# Patient Record
Sex: Male | Born: 1950 | Race: Black or African American | Hispanic: No | Smoking: Current some day smoker
Health system: Southern US, Community
[De-identification: ages and names within clinical notes are randomized; demographics above are authoritative.]

## PROBLEM LIST (undated history)

## (undated) DIAGNOSIS — I5189 Other ill-defined heart diseases: Secondary | ICD-10-CM

## (undated) DIAGNOSIS — E7800 Pure hypercholesterolemia, unspecified: Secondary | ICD-10-CM

## (undated) DIAGNOSIS — K219 Gastro-esophageal reflux disease without esophagitis: Secondary | ICD-10-CM

## (undated) DIAGNOSIS — K5900 Constipation, unspecified: Secondary | ICD-10-CM

## (undated) DIAGNOSIS — J45909 Unspecified asthma, uncomplicated: Secondary | ICD-10-CM

## (undated) HISTORY — DX: Other ill-defined heart diseases: I51.89

## (undated) HISTORY — DX: Gastro-esophageal reflux disease without esophagitis: K21.9

## (undated) HISTORY — PX: FRACTURE SURGERY: SHX138

## (undated) HISTORY — PX: ROTATOR CUFF REPAIR: SHX139

## (undated) HISTORY — DX: Constipation, unspecified: K59.00

---

## 1998-04-23 ENCOUNTER — Ambulatory Visit (HOSPITAL_COMMUNITY): Admission: RE | Admit: 1998-04-23 | Discharge: 1998-04-23 | Payer: Self-pay

## 1999-01-08 ENCOUNTER — Ambulatory Visit (HOSPITAL_COMMUNITY): Admission: RE | Admit: 1999-01-08 | Discharge: 1999-01-08 | Payer: Self-pay | Admitting: Unknown Physician Specialty

## 1999-02-18 ENCOUNTER — Emergency Department (HOSPITAL_COMMUNITY): Admission: EM | Admit: 1999-02-18 | Discharge: 1999-02-18 | Payer: Self-pay | Admitting: Emergency Medicine

## 1999-02-18 ENCOUNTER — Encounter: Payer: Self-pay | Admitting: Emergency Medicine

## 1999-02-23 ENCOUNTER — Encounter: Admission: RE | Admit: 1999-02-23 | Discharge: 1999-02-23 | Payer: Self-pay | Admitting: *Deleted

## 1999-06-19 ENCOUNTER — Emergency Department (HOSPITAL_COMMUNITY): Admission: EM | Admit: 1999-06-19 | Discharge: 1999-06-19 | Payer: Self-pay | Admitting: Emergency Medicine

## 1999-06-19 ENCOUNTER — Encounter: Payer: Self-pay | Admitting: Emergency Medicine

## 2000-04-10 ENCOUNTER — Emergency Department (HOSPITAL_COMMUNITY): Admission: EM | Admit: 2000-04-10 | Discharge: 2000-04-10 | Payer: Self-pay | Admitting: Internal Medicine

## 2000-05-19 ENCOUNTER — Ambulatory Visit (HOSPITAL_BASED_OUTPATIENT_CLINIC_OR_DEPARTMENT_OTHER): Admission: RE | Admit: 2000-05-19 | Discharge: 2000-05-20 | Payer: Self-pay | Admitting: Orthopedic Surgery

## 2000-07-12 ENCOUNTER — Encounter: Admission: RE | Admit: 2000-07-12 | Discharge: 2000-10-10 | Payer: Self-pay | Admitting: Endocrinology

## 2000-08-19 ENCOUNTER — Encounter: Payer: Self-pay | Admitting: Emergency Medicine

## 2000-08-19 ENCOUNTER — Emergency Department (HOSPITAL_COMMUNITY): Admission: EM | Admit: 2000-08-19 | Discharge: 2000-08-19 | Payer: Self-pay | Admitting: Emergency Medicine

## 2002-01-19 ENCOUNTER — Encounter: Payer: Self-pay | Admitting: Emergency Medicine

## 2002-01-19 ENCOUNTER — Emergency Department (HOSPITAL_COMMUNITY): Admission: EM | Admit: 2002-01-19 | Discharge: 2002-01-19 | Payer: Self-pay | Admitting: Emergency Medicine

## 2002-01-22 ENCOUNTER — Emergency Department (HOSPITAL_COMMUNITY): Admission: EM | Admit: 2002-01-22 | Discharge: 2002-01-22 | Payer: Self-pay | Admitting: Emergency Medicine

## 2003-03-19 ENCOUNTER — Emergency Department (HOSPITAL_COMMUNITY): Admission: EM | Admit: 2003-03-19 | Discharge: 2003-03-20 | Payer: Self-pay | Admitting: Emergency Medicine

## 2004-07-20 ENCOUNTER — Emergency Department (HOSPITAL_COMMUNITY): Admission: EM | Admit: 2004-07-20 | Discharge: 2004-07-20 | Payer: Self-pay | Admitting: Emergency Medicine

## 2011-02-10 ENCOUNTER — Emergency Department (HOSPITAL_COMMUNITY)
Admission: EM | Admit: 2011-02-10 | Discharge: 2011-02-10 | Disposition: A | Discharge status: Home / Self Care | Payer: Medicare Other | Attending: Emergency Medicine | Admitting: Emergency Medicine

## 2011-02-10 DIAGNOSIS — I10 Essential (primary) hypertension: Secondary | ICD-10-CM | POA: Insufficient documentation

## 2011-02-10 DIAGNOSIS — E119 Type 2 diabetes mellitus without complications: Secondary | ICD-10-CM | POA: Insufficient documentation

## 2011-02-10 DIAGNOSIS — K5289 Other specified noninfective gastroenteritis and colitis: Secondary | ICD-10-CM | POA: Insufficient documentation

## 2011-02-10 DIAGNOSIS — B029 Zoster without complications: Secondary | ICD-10-CM | POA: Insufficient documentation

## 2011-02-10 LAB — POCT I-STAT, CHEM 8
BUN: 7 mg/dL (ref 6–23)
Calcium, Ion: 1.16 mmol/L (ref 1.12–1.32)
Chloride: 104 mEq/L (ref 96–112)
Creatinine, Ser: 0.9 mg/dL (ref 0.4–1.5)
Glucose, Bld: 135 mg/dL — ABNORMAL HIGH (ref 70–99)
HCT: 41 % (ref 39.0–52.0)
Hemoglobin: 13.9 g/dL (ref 13.0–17.0)
Potassium: 4 mEq/L (ref 3.5–5.1)
Sodium: 141 mEq/L (ref 135–145)
TCO2: 26 mmol/L (ref 0–100)

## 2011-02-25 ENCOUNTER — Emergency Department (HOSPITAL_COMMUNITY): Payer: Medicare Other

## 2011-02-25 ENCOUNTER — Emergency Department (HOSPITAL_COMMUNITY)
Admission: EM | Admit: 2011-02-25 | Discharge: 2011-02-25 | Disposition: A | Discharge status: Home / Self Care | Payer: Medicare Other | Attending: Emergency Medicine | Admitting: Emergency Medicine

## 2011-02-25 DIAGNOSIS — I10 Essential (primary) hypertension: Secondary | ICD-10-CM | POA: Insufficient documentation

## 2011-02-25 DIAGNOSIS — K2970 Gastritis, unspecified, without bleeding: Secondary | ICD-10-CM | POA: Insufficient documentation

## 2011-02-25 DIAGNOSIS — E119 Type 2 diabetes mellitus without complications: Secondary | ICD-10-CM | POA: Insufficient documentation

## 2011-02-25 DIAGNOSIS — Z8619 Personal history of other infectious and parasitic diseases: Secondary | ICD-10-CM | POA: Insufficient documentation

## 2011-02-25 DIAGNOSIS — N281 Cyst of kidney, acquired: Secondary | ICD-10-CM | POA: Insufficient documentation

## 2011-02-25 DIAGNOSIS — R109 Unspecified abdominal pain: Secondary | ICD-10-CM | POA: Insufficient documentation

## 2011-02-25 LAB — GLUCOSE, CAPILLARY: Glucose-Capillary: 109 mg/dL — ABNORMAL HIGH (ref 70–99)

## 2011-02-25 LAB — COMPREHENSIVE METABOLIC PANEL
ALT: 25 U/L (ref 0–53)
AST: 26 U/L (ref 0–37)
Albumin: 3.7 g/dL (ref 3.5–5.2)
Alkaline Phosphatase: 34 U/L — ABNORMAL LOW (ref 39–117)
BUN: 14 mg/dL (ref 6–23)
CO2: 27 mEq/L (ref 19–32)
Calcium: 9.3 mg/dL (ref 8.4–10.5)
Chloride: 101 mEq/L (ref 96–112)
Creatinine, Ser: 0.8 mg/dL (ref 0.4–1.5)
GFR calc Af Amer: >60 mL/min (ref >60)
GFR calc non Af Amer: >60 mL/min (ref >60)
Glucose, Bld: 106 mg/dL — ABNORMAL HIGH (ref 70–99)
Potassium: 4.3 mEq/L (ref 3.5–5.1)
Sodium: 135 mEq/L (ref 135–145)
Total Bilirubin: 0.4 mg/dL (ref 0.3–1.2)
Total Protein: 8 g/dL (ref 6.0–8.3)

## 2011-02-25 LAB — DIFFERENTIAL
Basophils Absolute: 0 10*3/uL (ref 0.0–0.1)
Basophils Relative: 0 % (ref 0–1)
Eosinophils Absolute: 0.1 10*3/uL (ref 0.0–0.7)
Eosinophils Relative: 1 % (ref 0–5)
Lymphocytes Relative: 34 % (ref 12–46)
Lymphs Abs: 2.7 10*3/uL (ref 0.7–4.0)
Monocytes Absolute: 0.7 10*3/uL (ref 0.1–1.0)
Monocytes Relative: 9 % (ref 3–12)
Neutro Abs: 4.4 10*3/uL (ref 1.7–7.7)
Neutrophils Relative %: 56 % (ref 43–77)

## 2011-02-25 LAB — URINALYSIS, ROUTINE W REFLEX MICROSCOPIC
Bilirubin Urine: NEGATIVE
Glucose, UA: NEGATIVE mg/dL
Hgb urine dipstick: NEGATIVE
Ketones, ur: NEGATIVE mg/dL
Leukocytes, UA: NEGATIVE
Nitrite: NEGATIVE
Protein, ur: 100 mg/dL — AB
Specific Gravity, Urine: 1.013 (ref 1.005–1.030)
Urobilinogen, UA: 1 mg/dL (ref 0.0–1.0)
pH: 6 (ref 5.0–8.0)

## 2011-02-25 LAB — CBC
HCT: 37.5 % — ABNORMAL LOW (ref 39.0–52.0)
Hemoglobin: 12.9 g/dL — ABNORMAL LOW (ref 13.0–17.0)
MCH: 31.2 pg (ref 26.0–34.0)
MCHC: 34.4 g/dL (ref 30.0–36.0)
MCV: 90.8 fL (ref 78.0–100.0)
Platelets: 143 10*3/uL — ABNORMAL LOW (ref 150–400)
RBC: 4.13 MIL/uL — ABNORMAL LOW (ref 4.22–5.81)
RDW: 13 % (ref 11.5–15.5)
WBC: 7.9 10*3/uL (ref 4.0–10.5)

## 2011-02-25 LAB — CK TOTAL AND CKMB (NOT AT ARMC)
CK, MB: 2.5 ng/mL (ref 0.3–4.0)
Relative Index: 1.9 (ref 0.0–2.5)
Total CK: 132 U/L (ref 7–232)

## 2011-02-25 LAB — LIPASE, BLOOD: Lipase: 41 U/L (ref 11–59)

## 2011-02-25 LAB — URINE MICROSCOPIC-ADD ON

## 2011-02-25 LAB — TROPONIN I: Troponin I: <0.3 ng/mL (ref <0.30)

## 2012-05-13 ENCOUNTER — Encounter (HOSPITAL_COMMUNITY): Payer: Self-pay | Admitting: *Deleted

## 2012-05-13 ENCOUNTER — Emergency Department (HOSPITAL_COMMUNITY)
Admission: EM | Admit: 2012-05-13 | Discharge: 2012-05-13 | Disposition: A | Discharge status: Home / Self Care | Payer: Medicare Other | Attending: Emergency Medicine | Admitting: Emergency Medicine

## 2012-05-13 ENCOUNTER — Emergency Department (HOSPITAL_COMMUNITY): Payer: Medicare Other

## 2012-05-13 DIAGNOSIS — J45909 Unspecified asthma, uncomplicated: Secondary | ICD-10-CM | POA: Insufficient documentation

## 2012-05-13 DIAGNOSIS — E119 Type 2 diabetes mellitus without complications: Secondary | ICD-10-CM | POA: Insufficient documentation

## 2012-05-13 DIAGNOSIS — K2100 Gastro-esophageal reflux disease with esophagitis, without bleeding: Secondary | ICD-10-CM | POA: Insufficient documentation

## 2012-05-13 DIAGNOSIS — R002 Palpitations: Secondary | ICD-10-CM | POA: Insufficient documentation

## 2012-05-13 DIAGNOSIS — Z79899 Other long term (current) drug therapy: Secondary | ICD-10-CM | POA: Insufficient documentation

## 2012-05-13 DIAGNOSIS — E7800 Pure hypercholesterolemia, unspecified: Secondary | ICD-10-CM | POA: Insufficient documentation

## 2012-05-13 HISTORY — DX: Pure hypercholesterolemia, unspecified: E78.00

## 2012-05-13 HISTORY — DX: Unspecified asthma, uncomplicated: J45.909

## 2012-05-13 LAB — BASIC METABOLIC PANEL
BUN: 13 mg/dL (ref 6–23)
Calcium: 9.5 mg/dL (ref 8.4–10.5)
Creatinine, Ser: 0.89 mg/dL (ref 0.50–1.35)
GFR calc Af Amer: >90 mL/min (ref >90)
GFR calc non Af Amer: >90 mL/min (ref >90)
Glucose, Bld: 116 mg/dL — ABNORMAL HIGH (ref 70–99)
Potassium: 3.8 mEq/L (ref 3.5–5.1)

## 2012-05-13 LAB — POCT I-STAT TROPONIN I: Troponin i, poc: 0 ng/mL (ref 0.00–0.08)

## 2012-05-13 LAB — CBC
HCT: 36.7 % — ABNORMAL LOW (ref 39.0–52.0)
Hemoglobin: 12.6 g/dL — ABNORMAL LOW (ref 13.0–17.0)
MCH: 31 pg (ref 26.0–34.0)
MCHC: 34.3 g/dL (ref 30.0–36.0)
MCV: 90.2 fL (ref 78.0–100.0)
Platelets: 144 10*3/uL — ABNORMAL LOW (ref 150–400)
RBC: 4.07 MIL/uL — ABNORMAL LOW (ref 4.22–5.81)
RDW: 12.8 % (ref 11.5–15.5)
WBC: 8.2 10*3/uL (ref 4.0–10.5)

## 2012-05-13 MED ORDER — PANTOPRAZOLE SODIUM 40 MG PO TBEC
80.0000 mg | DELAYED_RELEASE_TABLET | Freq: Once | ORAL | Status: AC
  Administered 2012-05-13: 80 mg via ORAL
  Filled 2012-05-13: qty 2

## 2012-05-13 MED ORDER — GI COCKTAIL ~~LOC~~
30.0000 mL | Freq: Once | ORAL | Status: AC
  Administered 2012-05-13: 30 mL via ORAL
  Filled 2012-05-13: qty 30

## 2012-05-13 MED ORDER — PANTOPRAZOLE SODIUM 40 MG PO TBEC: 40.0000 mg | DELAYED_RELEASE_TABLET | Freq: Once | ORAL | Status: DC

## 2012-05-13 NOTE — ED Notes (Signed)
Reports waking up this am with palpitations, sob when it occurs. No acute distress noted at triage, ekg done, NSR on monitor.

## 2012-05-13 NOTE — ED Notes (Signed)
Patient states he has no pain at this time.
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
## 2012-05-13 NOTE — ED Provider Notes (Signed)
History     CSN: [id number]  Arrival date & time 05/13/12  1610   First MD Initiated Contact with Patient 05/13/12 1655      Chief Complaint  Patient presents with  . Irregular Heart Beat     HPI Patient again having palpitations at around 3 PM today.  They were brief.  Not associated with nausea or vomiting or diaphoresis.  Patient has no known cardiac history.  Does have symptoms of daily esophageal reflux and belching. Past Medical History  Diagnosis Date  . Asthma   . Diabetes mellitus   . High cholesterol     History reviewed. No pertinent past surgical history.  History reviewed. No pertinent family history.  History  Substance Use Topics  . Smoking status: Not on file  . Smokeless tobacco: Not on file  . Alcohol Use: No      Review of Systems  All other systems reviewed and are negative.    Allergies  Review of patient's allergies indicates no known allergies.  Home Medications   Current Outpatient Rx  Name Route Sig Dispense Refill  . ACYCLOVIR 800 MG PO TABS Oral Take 400 mg by mouth daily.    . ALBUTEROL SULFATE HFA 108 (90 BASE) MCG/ACT IN AERS Inhalation Inhale 2 puffs into the lungs every 4 (four) hours as needed. For shortness of breath    . ASPIRIN EC 81 MG PO TBEC Oral Take 81 mg by mouth daily.    . ATORVASTATIN CALCIUM 40 MG PO TABS Oral Take 20 mg by mouth at bedtime.    Marland Kitchen VITAMIN D-3 1000 UNITS PO CAPS Oral Take 1 capsule by mouth daily.    . OMEGA-3 FATTY ACIDS 1000 MG PO CAPS Oral Take 2 g by mouth 3 (three) times daily.    Marland Kitchen GLIPIZIDE 10 MG PO TABS Oral Take 10 mg by mouth 2 (two) times daily before a meal.    . HYDROCODONE-ACETAMINOPHEN 10-500 MG PO TABS Oral Take 1 tablet by mouth every 4 (four) hours as needed. For severe neck and low back pain    . LISINOPRIL 10 MG PO TABS Oral Take 5 mg by mouth daily.    Marland Kitchen METFORMIN HCL 1000 MG PO TABS Oral Take 1,000 mg by mouth 2 (two) times daily with a meal.    . MOMETASONE FUROATE 220 MCG/INH  IN AEPB Inhalation Inhale 2 puffs into the lungs at bedtime and may repeat dose one time if needed.    Carma Leaven M PLUS PO TABS Oral Take 1 tablet by mouth daily.    Marland Kitchen TERAZOSIN HCL 5 MG PO CAPS Oral Take 5 mg by mouth at bedtime.    Marland Kitchen UREA 40 % EX CREA Topical Apply 1 application topically. To feet    . ZOLPIDEM TARTRATE 10 MG PO TABS Oral Take 10 mg by mouth at bedtime as needed. For sleep    . PANTOPRAZOLE SODIUM 40 MG PO TBEC Oral Take 1 tablet (40 mg total) by mouth once. 30 tablet 0    BP 132/86  Pulse 87  Temp 98.3 F (36.8 C) (Oral)  Resp 18  SpO2 98%  Physical Exam  Nursing note and vitals reviewed. Constitutional: He is oriented to person, place, and time. He appears well-developed. No distress.  HENT:  Head: Normocephalic and atraumatic.  Eyes: Pupils are equal, round, and reactive to light.  Neck: Normal range of motion.  Cardiovascular: Normal rate and intact distal pulses.  Date: 05/13/2012  Rate: 89  Rhythm: normal sinus rhythm  QRS Axis: normal  Intervals: normal  ST/T Wave abnormalities: normal  Conduction Disutrbances: none  Narrative Interpretation: unremarkable      Pulmonary/Chest: No respiratory distress.  Abdominal: Normal appearance. He exhibits no distension.  Musculoskeletal: Normal range of motion.  Neurological: He is alert and oriented to person, place, and time. No cranial nerve deficit.  Skin: Skin is warm and dry. No rash noted.  Psychiatric: He has a normal mood and affect. His behavior is normal.    ED Course  Procedures (including critical care time) Scheduled Meds:   . gi cocktail  30 mL Oral Once  . pantoprazole  80 mg Oral Once   Continuous Infusions:  PRN Meds:.  Labs Reviewed  CBC - Abnormal; Notable for the following:    RBC 4.07 (*)     Hemoglobin 12.6 (*)     HCT 36.7 (*)     Platelets 144 (*)  PLATELET COUNT CONFIRMED BY SMEAR   All other components within normal limits  BASIC METABOLIC PANEL - Abnormal;  Notable for the following:    Glucose, Bld 116 (*)     All other components within normal limits  POCT I-STAT TROPONIN I  POCT I-STAT TROPONIN I   Dg Chest 2 View  05/13/2012  *RADIOLOGY REPORT*  Clinical Data: Palpitations.  CHEST - 2 VIEW  Comparison: None.  Findings: Heart and mediastinal contours are within normal limits. No focal opacities or effusions.  No acute bony abnormality.  IMPRESSION: No active cardiopulmonary disease.  Original Report Authenticated By: Cyndie Chime, M.D.     1. Reflux esophagitis       MDM  GI cocktail made patient feel much better.  In light of normal EKG and 2 sets of negative troponins I feel this most likely represents esophageal reflux symptoms.   After treatment in the ED the patient feels back to baseline and wants to go home.         Nelia Shi, MD 05/13/12 [phone]

## 2013-06-18 ENCOUNTER — Emergency Department (HOSPITAL_COMMUNITY)
Admission: EM | Admit: 2013-06-18 | Discharge: 2013-06-18 | Disposition: A | Discharge status: Home / Self Care | Payer: Medicare Other | Attending: Emergency Medicine | Admitting: Emergency Medicine

## 2013-06-18 ENCOUNTER — Encounter (HOSPITAL_COMMUNITY): Payer: Self-pay | Admitting: Emergency Medicine

## 2013-06-18 ENCOUNTER — Emergency Department (HOSPITAL_COMMUNITY): Payer: Medicare Other

## 2013-06-18 DIAGNOSIS — J45909 Unspecified asthma, uncomplicated: Secondary | ICD-10-CM | POA: Insufficient documentation

## 2013-06-18 DIAGNOSIS — Y939 Activity, unspecified: Secondary | ICD-10-CM | POA: Insufficient documentation

## 2013-06-18 DIAGNOSIS — Y92410 Unspecified street and highway as the place of occurrence of the external cause: Secondary | ICD-10-CM | POA: Insufficient documentation

## 2013-06-18 DIAGNOSIS — S4980XA Other specified injuries of shoulder and upper arm, unspecified arm, initial encounter: Secondary | ICD-10-CM | POA: Insufficient documentation

## 2013-06-18 DIAGNOSIS — M25511 Pain in right shoulder: Secondary | ICD-10-CM

## 2013-06-18 DIAGNOSIS — Z7982 Long term (current) use of aspirin: Secondary | ICD-10-CM | POA: Insufficient documentation

## 2013-06-18 DIAGNOSIS — R209 Unspecified disturbances of skin sensation: Secondary | ICD-10-CM | POA: Insufficient documentation

## 2013-06-18 DIAGNOSIS — Z79899 Other long term (current) drug therapy: Secondary | ICD-10-CM | POA: Insufficient documentation

## 2013-06-18 DIAGNOSIS — E119 Type 2 diabetes mellitus without complications: Secondary | ICD-10-CM | POA: Insufficient documentation

## 2013-06-18 DIAGNOSIS — E7800 Pure hypercholesterolemia, unspecified: Secondary | ICD-10-CM | POA: Insufficient documentation

## 2013-06-18 DIAGNOSIS — Z794 Long term (current) use of insulin: Secondary | ICD-10-CM | POA: Insufficient documentation

## 2013-06-18 DIAGNOSIS — S46909A Unspecified injury of unspecified muscle, fascia and tendon at shoulder and upper arm level, unspecified arm, initial encounter: Secondary | ICD-10-CM | POA: Insufficient documentation

## 2013-06-18 MED ORDER — MELOXICAM 15 MG PO TABS: 15.0000 mg | ORAL_TABLET | Freq: Every day | ORAL | Status: DC

## 2013-06-18 MED ORDER — OXYCODONE-ACETAMINOPHEN 5-325 MG PO TABS
2.0000 | ORAL_TABLET | Freq: Once | ORAL | Status: AC
  Administered 2013-06-18: 2 via ORAL
  Filled 2013-06-18: qty 2

## 2013-06-18 MED ORDER — OXYCODONE-ACETAMINOPHEN 10-325 MG PO TABS: 1.0000 | ORAL_TABLET | ORAL | Status: DC | PRN
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
## 2013-06-20 NOTE — ED Provider Notes (Signed)
Medical screening examination/treatment/procedure(s) were performed by non-physician practitioner and as supervising physician I was immediately available for consultation/collaboration.  Courtney F Horton, MD 06/20/13 1910 

## 2013-12-14 ENCOUNTER — Emergency Department (HOSPITAL_COMMUNITY)
Admission: EM | Admit: 2013-12-14 | Discharge: 2013-12-14 | Disposition: A | Discharge status: Home / Self Care | Payer: Medicare Other | Attending: Emergency Medicine | Admitting: Emergency Medicine

## 2013-12-14 ENCOUNTER — Encounter (HOSPITAL_COMMUNITY): Payer: Self-pay | Admitting: Emergency Medicine

## 2013-12-14 ENCOUNTER — Emergency Department (HOSPITAL_COMMUNITY): Payer: Medicare Other

## 2013-12-14 DIAGNOSIS — E119 Type 2 diabetes mellitus without complications: Secondary | ICD-10-CM | POA: Insufficient documentation

## 2013-12-14 DIAGNOSIS — J45909 Unspecified asthma, uncomplicated: Secondary | ICD-10-CM | POA: Insufficient documentation

## 2013-12-14 DIAGNOSIS — E860 Dehydration: Secondary | ICD-10-CM | POA: Insufficient documentation

## 2013-12-14 DIAGNOSIS — R1013 Epigastric pain: Secondary | ICD-10-CM | POA: Insufficient documentation

## 2013-12-14 DIAGNOSIS — R1084 Generalized abdominal pain: Secondary | ICD-10-CM | POA: Insufficient documentation

## 2013-12-14 DIAGNOSIS — IMO0002 Reserved for concepts with insufficient information to code with codable children: Secondary | ICD-10-CM | POA: Insufficient documentation

## 2013-12-14 DIAGNOSIS — Z87891 Personal history of nicotine dependence: Secondary | ICD-10-CM | POA: Insufficient documentation

## 2013-12-14 DIAGNOSIS — Z794 Long term (current) use of insulin: Secondary | ICD-10-CM | POA: Insufficient documentation

## 2013-12-14 DIAGNOSIS — Z79899 Other long term (current) drug therapy: Secondary | ICD-10-CM | POA: Insufficient documentation

## 2013-12-14 DIAGNOSIS — E7800 Pure hypercholesterolemia, unspecified: Secondary | ICD-10-CM | POA: Insufficient documentation

## 2013-12-14 DIAGNOSIS — R000 Tachycardia, unspecified: Secondary | ICD-10-CM | POA: Insufficient documentation

## 2013-12-14 DIAGNOSIS — Z7982 Long term (current) use of aspirin: Secondary | ICD-10-CM | POA: Insufficient documentation

## 2013-12-14 DIAGNOSIS — E785 Hyperlipidemia, unspecified: Secondary | ICD-10-CM | POA: Insufficient documentation

## 2013-12-14 DIAGNOSIS — R197 Diarrhea, unspecified: Secondary | ICD-10-CM

## 2013-12-14 LAB — CBC WITH DIFFERENTIAL/PLATELET
BASOS PCT: 0 % (ref 0–1)
Basophils Absolute: 0 10*3/uL (ref 0.0–0.1)
EOS PCT: 0 % (ref 0–5)
Eosinophils Absolute: 0 10*3/uL (ref 0.0–0.7)
HCT: 37.8 % — ABNORMAL LOW (ref 39.0–52.0)
HEMOGLOBIN: 13.3 g/dL (ref 13.0–17.0)
LYMPHS PCT: 20 % (ref 12–46)
Lymphs Abs: 1.5 10*3/uL (ref 0.7–4.0)
MCH: 31.4 pg (ref 26.0–34.0)
MCHC: 35.2 g/dL (ref 30.0–36.0)
MCV: 89.2 fL (ref 78.0–100.0)
Monocytes Absolute: 0.6 10*3/uL (ref 0.1–1.0)
Monocytes Relative: 8 % (ref 3–12)
Neutro Abs: 5.5 10*3/uL (ref 1.7–7.7)
Neutrophils Relative %: 72 % (ref 43–77)
Platelets: 87 10*3/uL — ABNORMAL LOW (ref 150–400)
RBC: 4.24 MIL/uL (ref 4.22–5.81)
RDW: 12.7 % (ref 11.5–15.5)
WBC: 7.6 10*3/uL (ref 4.0–10.5)

## 2013-12-14 LAB — URINE MICROSCOPIC-ADD ON

## 2013-12-14 LAB — COMPREHENSIVE METABOLIC PANEL
ALT: 41 U/L (ref 0–53)
AST: 35 U/L (ref 0–37)
Albumin: 3.8 g/dL (ref 3.5–5.2)
Alkaline Phosphatase: 46 U/L (ref 39–117)
BILIRUBIN TOTAL: 0.5 mg/dL (ref 0.3–1.2)
BUN: 14 mg/dL (ref 6–23)
CALCIUM: 9.1 mg/dL (ref 8.4–10.5)
CO2: 20 mEq/L (ref 19–32)
Chloride: 102 mEq/L (ref 96–112)
Creatinine, Ser: 1.16 mg/dL (ref 0.50–1.35)
GFR calc non Af Amer: 66 mL/min — ABNORMAL LOW (ref >90)
GFR, EST AFRICAN AMERICAN: 76 mL/min — AB (ref >90)
GLUCOSE: 197 mg/dL — AB (ref 70–99)
Potassium: 4.5 mEq/L (ref 3.7–5.3)
Sodium: 138 mEq/L (ref 137–147)
Total Protein: 7.9 g/dL (ref 6.0–8.3)

## 2013-12-14 LAB — URINALYSIS, ROUTINE W REFLEX MICROSCOPIC
GLUCOSE, UA: NEGATIVE mg/dL
Hgb urine dipstick: NEGATIVE
Ketones, ur: NEGATIVE mg/dL
Leukocytes, UA: NEGATIVE
NITRITE: NEGATIVE
Protein, ur: 100 mg/dL — AB
Specific Gravity, Urine: 1.03 (ref 1.005–1.030)
Urobilinogen, UA: 1 mg/dL (ref 0.0–1.0)
pH: 5.5 (ref 5.0–8.0)

## 2013-12-14 LAB — LIPASE, BLOOD: LIPASE: 21 U/L (ref 11–59)

## 2013-12-14 LAB — I-STAT TROPONIN, ED: TROPONIN I, POC: 0 ng/mL (ref 0.00–0.08)

## 2013-12-14 MED ORDER — IOHEXOL 300 MG/ML  SOLN
100.0000 mL | Freq: Once | INTRAMUSCULAR | Status: AC | PRN
  Administered 2013-12-14: 100 mL via INTRAVENOUS

## 2013-12-14 MED ORDER — ONDANSETRON 4 MG PO TBDP: ORAL_TABLET | ORAL | Status: DC

## 2013-12-14 MED ORDER — IOHEXOL 300 MG/ML  SOLN
50.0000 mL | Freq: Once | INTRAMUSCULAR | Status: AC | PRN
Start: 2013-12-14 — End: 2013-12-14
  Administered 2013-12-14: 50 mL via ORAL

## 2013-12-14 MED ORDER — ONDANSETRON HCL 4 MG/2ML IJ SOLN
4.0000 mg | Freq: Once | INTRAMUSCULAR | Status: AC
  Administered 2013-12-14: 4 mg via INTRAVENOUS
  Filled 2013-12-14: qty 2

## 2013-12-14 MED ORDER — SODIUM CHLORIDE 0.9 % IV BOLUS (SEPSIS)
1000.0000 mL | Freq: Once | INTRAVENOUS | Status: AC
  Administered 2013-12-14: 1000 mL via INTRAVENOUS

## 2013-12-14 NOTE — ED Provider Notes (Addendum)
CSN: [id number]     Arrival date & time 12/14/13  1016 History   First MD Initiated Contact with Patient 12/14/13 1108     Chief Complaint  Patient presents with  . Abdominal Pain  . Diarrhea     (Consider location/radiation/quality/duration/timing/severity/associated sxs/prior Treatment) The history is provided by the patient.  Justin Clayton is a 63 y.o. male hx of asthma, DM HL here with diarrhea, epigastric pain. Diarrhea for the last to 3 days. States watery diarrhea about 10 episodes a day. He is on acyclovir for herpes. No recent antibiotic use. Also has epigastric pain and nausea but no vomiting. No fevers. No urinary symptoms.    Past Medical History  Diagnosis Date  . Asthma   . Diabetes mellitus   . High cholesterol    Past Surgical History  Procedure Laterality Date  . Rotator cuff repair Bilateral   . Fracture surgery Right     R leg   History reviewed. No pertinent family history. History  Substance Use Topics  . Smoking status: Former Games developer  . Smokeless tobacco: Not on file  . Alcohol Use: No    Review of Systems  Gastrointestinal: Positive for abdominal pain and diarrhea.  All other systems reviewed and are negative.      Allergies  Review of patient's allergies indicates no known allergies.  Home Medications   Current Outpatient Rx  Name  Route  Sig  Dispense  Refill  . acyclovir (ZOVIRAX) 800 MG tablet   Oral   Take 400 mg by mouth daily.         Marland Kitchen albuterol (PROVENTIL HFA;VENTOLIN HFA) 108 (90 BASE) MCG/ACT inhaler   Inhalation   Inhale 2 puffs into the lungs every 4 (four) hours as needed. For shortness of breath         . aspirin EC 81 MG tablet   Oral   Take 81 mg by mouth daily.         Marland Kitchen atorvastatin (LIPITOR) 80 MG tablet   Oral   Take 40 mg by mouth daily.         . Cholecalciferol (VITAMIN D-3) 1000 UNITS CAPS   Oral   Take 1 capsule by mouth 3 (three) times daily.          Marland Kitchen docusate sodium (COLACE) 100  MG capsule   Oral   Take 100 mg by mouth 2 (two) times daily.         . fish oil-omega-3 fatty acids 1000 MG capsule   Oral   Take 2 g by mouth 3 (three) times daily.         Marland Kitchen glipiZIDE (GLUCOTROL) 10 MG tablet   Oral   Take 10 mg by mouth 2 (two) times daily before a meal.         . insulin glargine (LANTUS) 100 units/mL SOLN   Subcutaneous   Inject 12 Units into the skin daily at 10 pm.          . lisinopril (PRINIVIL,ZESTRIL) 10 MG tablet   Oral   Take 5 mg by mouth daily.         . Menthol-Methyl Salicylate (MUSCLE RUB) 10-15 % CREA   Topical   Apply 1 application topically as needed for muscle pain (knee and elbow pain).         . metFORMIN (GLUCOPHAGE) 1000 MG tablet   Oral   Take 1,000 mg by mouth 2 (two) times daily with a meal.         .  mometasone (ASMANEX) 220 MCG/INH inhaler   Inhalation   Inhale 2 puffs into the lungs at bedtime and may repeat dose one time if needed.         Marland Kitchen morphine (MSIR) 15 MG tablet   Oral   Take 15 mg by mouth 2 (two) times daily.         . Multiple Vitamins-Minerals (MULTIVITAMINS THER. W/MINERALS) TABS   Oral   Take 1 tablet by mouth daily.         . pantoprazole (PROTONIX) 40 MG tablet   Oral   Take 40 mg by mouth daily.         Marland Kitchen terazosin (HYTRIN) 5 MG capsule   Oral   Take 5 mg by mouth at bedtime.         . urea (CARMOL) 40 % CREA   Topical   Apply 1 application topically. To feet         . zolpidem (AMBIEN) 10 MG tablet   Oral   Take 10 mg by mouth at bedtime as needed. For sleep         . sildenafil (VIAGRA) 100 MG tablet   Oral   Take 100 mg by mouth daily as needed for erectile dysfunction.          BP 155/89  Pulse 85  Temp(Src) 98 F (36.7 C) (Oral)  Resp 22  SpO2 98% Physical Exam  Nursing note and vitals reviewed. Constitutional: He is oriented to person, place, and time.  Dehydrated, tired   HENT:  Head: Normocephalic.  MM slightly dry   Eyes: Conjunctivae and  EOM are normal. Pupils are equal, round, and reactive to light.  Neck: Normal range of motion. Neck supple.  Cardiovascular: Regular rhythm and normal heart sounds.   Slightly tachy   Pulmonary/Chest: Effort normal and breath sounds normal. No respiratory distress. He has no wheezes. He has no rales.  Abdominal: Soft.  Mild diffuse tenderness, no rebound   Musculoskeletal: Normal range of motion. He exhibits no edema.  Neurological: He is alert and oriented to person, place, and time. No cranial nerve deficit. Coordination normal.  Skin: Skin is warm and dry.  Psychiatric: He has a normal mood and affect. His behavior is normal. Judgment and thought content normal.    ED Course  Procedures (including critical care time)  Angiocath insertion Performed by: Silverio Lay, DAVID  Consent: Verbal consent obtained. Risks and benefits: risks, benefits and alternatives were discussed Time out: Immediately prior to procedure a "time out" was called to verify the correct patient, procedure, equipment, support staff and site/side marked as required.  Preparation: Patient was prepped and draped in the usual sterile fashion.  Vein Location: L brachial  Ultrasound Guided  Gauge: 20 gauge long catheter   Normal blood return and flush without difficulty Patient tolerance: Patient tolerated the procedure well with no immediate complications.     Labs Review Labs Reviewed  COMPREHENSIVE METABOLIC PANEL - Abnormal; Notable for the following:    Glucose, Bld 197 (*)    GFR calc non Af Amer 66 (*)    GFR calc Af Amer 76 (*)    All other components within normal limits  CBC WITH DIFFERENTIAL - Abnormal; Notable for the following:    HCT 37.8 (*)    Platelets 87 (*)    All other components within normal limits  URINALYSIS, ROUTINE W REFLEX MICROSCOPIC - Abnormal; Notable for the following:    Color, Urine AMBER (*)  APPearance CLOUDY (*)    Bilirubin Urine SMALL (*)    Protein, ur 100 (*)     All other components within normal limits  URINE MICROSCOPIC-ADD ON - Abnormal; Notable for the following:    Bacteria, UA FEW (*)    Casts HYALINE CASTS (*)    All other components within normal limits  CLOSTRIDIUM DIFFICILE BY PCR  LIPASE, BLOOD  I-STAT TROPOININ, ED   Imaging Review Ct Abdomen Pelvis W Contrast  12/14/2013   CLINICAL DATA:  Diarrhea for 2 days, epigastric pain, abdominal pain, nausea  EXAM: CT ABDOMEN AND PELVIS WITH CONTRAST  TECHNIQUE: Multidetector CT imaging of the abdomen and pelvis was performed using the standard protocol following bolus administration of intravenous contrast.  CONTRAST:  OMNIPAQUE IOHEXOL 300 MG/ML  SOLN  COMPARISON:  US ABDOMEN COMPLETE dated 02/25/2011  FINDINGS: Normal hepatic contour. No discrete hepatic lesions. Normal appearance of the gallbladder. No definite radiopaque gallstones. No definite intra or extrahepatic note dilatation. No ascites.  There is symmetric enhancement and excretion of the bilateral kidneys. Note is made of an approximately 2.10 x 2.0 cm hypo attenuating (5 Hounsfield units) partially exophytic cyst arising from the anterior aspect of the superior pole the right kidney 10 is 23, series 2). No definite left-sided renal lesions. No definite renal stones on this postcontrast examination. No urinary obstruction or perinephric stranding.  Normal appearance of the bilateral adrenal glands, pancreas and spleen.  Ingested enteric contrast extends to the mid small bowel. The bowel is normal in course and caliber without wall thickening or evidence of obstruction. Normal appearance of the retrocecal appendix. No pneumoperitoneum, pneumatosis or portal venous gas.  Scattered minimal atherosclerotic plaque within a normal caliber abdominal aorta. The major branch vessels of the abdominal aorta appear patent on this non CTA examination. Incidental note is made of a left-sided retro aortic renal vein.  Scattered shotty retroperitoneal,  porta hepatis and bilateral inguinal lymph nodes individually not enlarged by size criteria. No retroperitoneal, mesenteric, pelvic or inguinal lymphadenopathy.  Normal appearance of the pelvic organs. Several phleboliths are seen within the lower pelvis. No free fluid within the pelvis.  Limited visualization of the lower thorax is negative for discrete focal airspace opacity or pleural effusion. Normal heart size. No pericardial effusion.  No acute or aggressive osseous abnormalities. Prominent geodes are noted within the medial aspect of the bilateral femoral heads (coronal image 66, series 5) without associated articular surface irregularity or significant degenerative change of either hip.  Regional soft tissues appear normal.  IMPRESSION: No explanation for patient's abdominal pain, diarrhea and epigastric abdominal pain. Specifically, no evidence of enteric or urinary obstruction. Normal appearance of the appendix.   Electronically Signed   By: Simonne Come M.D.   On: 12/14/2013 12:37     EKG Interpretation   Date/Time:  Friday December 14 2013 10:41:26 EDT Ventricular Rate:  105 PR Interval:  133 QRS Duration: 88 QT Interval:  333 QTC Calculation: 440 R Axis:   88 Text Interpretation:  Sinus tachycardia Left atrial enlargement Borderline  right axis deviation Baseline wander in lead(s) V3 V4 Since last tracing  rate faster Confirmed by YAO  MD, DAVID ([id number]) on 12/14/2013 11:09:51 AM      MDM   Final diagnoses:  None   Justin Clayton is a 63 y.o. male here with abdominal pain, diarrhea. Consider C diff vs gastro. Will get labs, CT ab/pel, stool for C diff.   1:40 PM I placed US guided  IV. Hasn't had BM in the ED. WBC nl. CT showed no obvious colitis. I think likely gastro. BP and HR improved with IVF. Recommend imodium, prn zofran.    Richardean Canalavid H Yao, MD 12/14/13 1341  Richardean Canalavid H Yao, MD 12/14/13 [phone]

## 2013-12-14 NOTE — ED Notes (Signed)
Pt unable to void at this time. Urinal has been placed in room and pt stated that he will call out as soon as he provides specimen for UA

## 2013-12-14 NOTE — ED Notes (Signed)
Pt escorted to discharge window. Pt verbalized understanding discharge instructions. In no acute distress.  

## 2013-12-14 NOTE — Discharge Instructions (Signed)
Take zofran as needed for nausea.   Stay hydrated.   Use imodium as needed for diarrhea.   Follow up with your doctor.   Return to ER if you have severe pain, vomiting, fever.    Diet for Diarrhea, Adult Frequent, runny stools (diarrhea) may be caused or worsened by food or drink. Diarrhea may be relieved by changing your diet. Since diarrhea can last up to 7 days, it is easy for you to lose too much fluid from the body and become dehydrated. Fluids that are lost need to be replaced. Along with a modified diet, make sure you drink enough fluids to keep your urine clear or pale yellow. DIET INSTRUCTIONS  Ensure adequate fluid intake (hydration): have 1 cup (8 oz) of fluid for each diarrhea episode. Avoid fluids that contain simple sugars or sports drinks, fruit juices, whole milk products, and sodas. Your urine should be clear or pale yellow if you are drinking enough fluids. Hydrate with an oral rehydration solution that you can purchase at pharmacies, retail stores, and online. You can prepare an oral rehydration solution at home by mixing the following ingredients together:    tsp table salt.   tsp baking soda.   tsp salt substitute containing potassium chloride.  1  tablespoons sugar.  1 L (34 oz) of water.  Certain foods and beverages may increase the speed at which food moves through the gastrointestinal (GI) tract. These foods and beverages should be avoided and include:  Caffeinated and alcoholic beverages.  High-fiber foods, such as raw fruits and vegetables, nuts, seeds, and whole grain breads and cereals.  Foods and beverages sweetened with sugar alcohols, such as xylitol, sorbitol, and mannitol.  Some foods may be well tolerated and may help thicken stool including:  Starchy foods, such as rice, toast, pasta, low-sugar cereal, oatmeal, grits, baked potatoes, crackers, and bagels.   Bananas.   Applesauce.  Add probiotic-rich foods to help increase healthy  bacteria in the GI tract, such as yogurt and fermented milk products. RECOMMENDED FOODS AND BEVERAGES Starches Choose foods with less than 2 g of fiber per serving.  Recommended:  White, [place]French, and pita breads, plain rolls, buns, bagels. Plain muffins, matzo. Soda, saltine, or graham crackers. Pretzels, melba toast, zwieback. Cooked cereals made with water: cornmeal, farina, cream cereals. Dry cereals: refined corn, wheat, rice. Potatoes prepared any way without skins, refined macaroni, spaghetti, noodles, refined rice.  Avoid:  Bread, rolls, or crackers made with whole wheat, multi-grains, rye, bran seeds, nuts, or coconut. Corn tortillas or taco shells. Cereals containing whole grains, multi-grains, bran, coconut, nuts, raisins. Cooked or dry oatmeal. Coarse wheat cereals, granola. Cereals advertised as "high-fiber." Potato skins. Whole grain pasta, wild or brown rice. Popcorn. Sweet potatoes, yams. Sweet rolls, doughnuts, waffles, pancakes, sweet breads. Vegetables  Recommended: Strained tomato and vegetable juices. Most well-cooked and canned vegetables without seeds. Fresh: Tender lettuce, cucumber without the skin, cabbage, spinach, bean sprouts.  Avoid: Fresh, cooked, or canned: Artichokes, baked beans, beet greens, broccoli, Brussels sprouts, corn, kale, legumes, peas, sweet potatoes. Cooked: Green or red cabbage, spinach. Avoid large servings of any vegetables because vegetables shrink when cooked, and they contain more fiber per serving than fresh vegetables. Fruit  Recommended: Cooked or canned: Apricots, applesauce, cantaloupe, cherries, fruit cocktail, grapefruit, grapes, kiwi, mandarin oranges, peaches, pears, plums, watermelon. Fresh: Apples without skin, ripe banana, grapes, cantaloupe, cherries, grapefruit, peaches, oranges, plums. Keep servings limited to  cup or 1 piece.  Avoid: Fresh: Apples with skin,  apricots, mangoes, pears, raspberries, strawberries. Prune juice, stewed or  dried prunes. Dried fruits, raisins, dates. Large servings of all fresh fruits. Protein  Recommended: Ground or well-cooked tender beef, ham, veal, lamb, pork, or poultry. Eggs. Fish, oysters, shrimp, lobster, other seafoods. Liver, organ meats.  Avoid: Tough, fibrous meats with gristle. Peanut butter, smooth or chunky. Cheese, nuts, seeds, legumes, dried peas, beans, lentils. Dairy  Recommended: Yogurt, lactose-free milk, kefir, drinkable yogurt, buttermilk, soy milk, or plain hard cheese.  Avoid: Milk, chocolate milk, beverages made with milk, such as milkshakes. Soups  Recommended: Bouillon, broth, or soups made from allowed foods. Any strained soup.  Avoid: Soups made from vegetables that are not allowed, cream or milk-based soups. Desserts and Sweets  Recommended: Sugar-free gelatin, sugar-free frozen ice pops made without sugar alcohol.  Avoid: Plain cakes and cookies, pie made with fruit, pudding, custard, cream pie. Gelatin, fruit, ice, sherbet, frozen ice pops. Ice cream, ice milk without nuts. Plain hard candy, honey, jelly, molasses, syrup, sugar, chocolate syrup, gumdrops, marshmallows. Fats and Oils  Recommended: Limit fats to less than 8 tsp per day.  Avoid: Seeds, nuts, olives, avocados. Margarine, butter, cream, mayonnaise, salad oils, plain salad dressings. Plain gravy, crisp bacon without rind. Beverages  Recommended: Water, decaffeinated teas, oral rehydration solutions, sugar-free beverages not sweetened with sugar alcohols.  Avoid: Fruit juices, caffeinated beverages (coffee, tea, soda), alcohol, sports drinks, or lemon-lime soda. Condiments  Recommended: Ketchup, mustard, horseradish, vinegar, cocoa powder. Spices in moderation: allspice, basil, bay leaves, celery powder or leaves, cinnamon, cumin powder, curry powder, ginger, mace, marjoram, onion or garlic powder, oregano, paprika, parsley flakes, ground pepper, rosemary, sage, savory, tarragon, thyme,  turmeric.  Avoid: Coconut, honey. Document Released: 12/04/2003 Document Revised: 06/07/2012 Document Reviewed: 01/28/2012 [facility] Patient Information 2014 [place], [place].

## 2013-12-14 NOTE — ED Notes (Addendum)
Pt c/o diarrhea x 2 days and epigastric pain, abdominal pain, and nausea x 1 day.  Pain score 6/10.

## 2013-12-14 NOTE — ED Notes (Signed)
Pt to CT

## 2015-01-22 DIAGNOSIS — N281 Cyst of kidney, acquired: Secondary | ICD-10-CM | POA: Diagnosis not present

## 2015-01-22 DIAGNOSIS — K5730 Diverticulosis of large intestine without perforation or abscess without bleeding: Secondary | ICD-10-CM | POA: Diagnosis not present

## 2015-04-29 ENCOUNTER — Emergency Department (HOSPITAL_COMMUNITY)
Admission: EM | Admit: 2015-04-29 | Discharge: 2015-04-30 | Disposition: A | Discharge status: Home / Self Care | Payer: Medicare Other | Attending: Emergency Medicine | Admitting: Emergency Medicine

## 2015-04-29 ENCOUNTER — Emergency Department (HOSPITAL_COMMUNITY): Payer: Medicare Other

## 2015-04-29 ENCOUNTER — Encounter (HOSPITAL_COMMUNITY): Payer: Self-pay | Admitting: Emergency Medicine

## 2015-04-29 DIAGNOSIS — J45909 Unspecified asthma, uncomplicated: Secondary | ICD-10-CM | POA: Insufficient documentation

## 2015-04-29 DIAGNOSIS — Z7951 Long term (current) use of inhaled steroids: Secondary | ICD-10-CM | POA: Diagnosis not present

## 2015-04-29 DIAGNOSIS — E119 Type 2 diabetes mellitus without complications: Secondary | ICD-10-CM | POA: Diagnosis not present

## 2015-04-29 DIAGNOSIS — Z87891 Personal history of nicotine dependence: Secondary | ICD-10-CM | POA: Insufficient documentation

## 2015-04-29 DIAGNOSIS — Z7982 Long term (current) use of aspirin: Secondary | ICD-10-CM | POA: Insufficient documentation

## 2015-04-29 DIAGNOSIS — E780 Pure hypercholesterolemia: Secondary | ICD-10-CM | POA: Diagnosis not present

## 2015-04-29 DIAGNOSIS — N281 Cyst of kidney, acquired: Secondary | ICD-10-CM | POA: Diagnosis not present

## 2015-04-29 DIAGNOSIS — Z79899 Other long term (current) drug therapy: Secondary | ICD-10-CM | POA: Diagnosis not present

## 2015-04-29 DIAGNOSIS — R1084 Generalized abdominal pain: Secondary | ICD-10-CM | POA: Diagnosis not present

## 2015-04-29 DIAGNOSIS — R1011 Right upper quadrant pain: Secondary | ICD-10-CM | POA: Insufficient documentation

## 2015-04-29 DIAGNOSIS — R110 Nausea: Secondary | ICD-10-CM | POA: Diagnosis not present

## 2015-04-29 DIAGNOSIS — R109 Unspecified abdominal pain: Secondary | ICD-10-CM | POA: Diagnosis not present

## 2015-04-29 DIAGNOSIS — Z794 Long term (current) use of insulin: Secondary | ICD-10-CM | POA: Diagnosis not present

## 2015-04-29 LAB — URINALYSIS, ROUTINE W REFLEX MICROSCOPIC
Bilirubin Urine: NEGATIVE
Glucose, UA: NEGATIVE mg/dL
HGB URINE DIPSTICK: NEGATIVE
Ketones, ur: NEGATIVE mg/dL
Leukocytes, UA: NEGATIVE
Nitrite: NEGATIVE
Protein, ur: 30 mg/dL — AB
Specific Gravity, Urine: 1.013 (ref 1.005–1.030)
UROBILINOGEN UA: 0.2 mg/dL (ref 0.0–1.0)
pH: 7 (ref 5.0–8.0)

## 2015-04-29 LAB — URINE MICROSCOPIC-ADD ON

## 2015-04-29 MED ORDER — ONDANSETRON HCL 4 MG/2ML IJ SOLN
4.0000 mg | Freq: Once | INTRAMUSCULAR | Status: AC
  Administered 2015-04-29: 4 mg via INTRAVENOUS
  Filled 2015-04-29: qty 2

## 2015-04-29 MED ORDER — MORPHINE SULFATE 4 MG/ML IJ SOLN
4.0000 mg | Freq: Once | INTRAMUSCULAR | Status: AC
  Administered 2015-04-29: 4 mg via INTRAVENOUS
  Filled 2015-04-29: qty 1

## 2015-04-29 NOTE — ED Provider Notes (Signed)
CSN: [id number]     Arrival date & time 04/29/15  1757 History   First MD Initiated Contact with Patient 04/29/15 2018     Chief Complaint  Patient presents with  . Abdominal Pain     (Consider location/radiation/quality/duration/timing/severity/associated sxs/prior Treatment) HPI Justin Clayton is a 64 y.o. male history of asthma, diabetes, high cholesterol, presents to emergency department complaining of abdominal pain. Patient states abdominal pain started 2 days ago. Pain is in the right upper quadrant. He states that his symptoms worsened today. He states that he has tried taking some accidents at home and had no relief. He went to see his primary care doctor at [facility] in [place] and was given Senokot, mag citrate, which he states he took with large bowel movement but his pain persisted after that. He states eating makes his pain worse. He reports nausea. He denies any vomiting. He denies any urinary symptoms. He is taking oxycodone for the pain. No prior abdominal surgeries.  Past Medical History  Diagnosis Date  . Asthma   . Diabetes mellitus   . High cholesterol    Past Surgical History  Procedure Laterality Date  . Rotator cuff repair Bilateral   . Fracture surgery Right     R leg   History reviewed. No pertinent family history. History  Substance Use Topics  . Smoking status: Former Games developer  . Smokeless tobacco: Not on file  . Alcohol Use: No    Review of Systems  Constitutional: Negative for fever and chills.  Respiratory: Negative for cough, chest tightness and shortness of breath.   Cardiovascular: Negative for chest pain, palpitations and leg swelling.  Gastrointestinal: Positive for nausea and abdominal pain. Negative for vomiting, diarrhea and abdominal distention.  Genitourinary: Negative for dysuria, urgency, frequency and hematuria.  Musculoskeletal: Negative for myalgias, arthralgias, neck pain and neck stiffness.  Skin: Negative for rash.   Allergic/Immunologic: Negative for immunocompromised state.  Neurological: Negative for dizziness, weakness, light-headedness, numbness and headaches.  All other systems reviewed and are negative.     Allergies  Review of patient's allergies indicates no known allergies.  Home Medications   Prior to Admission medications   Medication Sig Start Date End Date Taking? Authorizing Provider  acyclovir (ZOVIRAX) 800 MG tablet Take 400 mg by mouth daily.   Yes Historical Provider, MD  albuterol (PROVENTIL HFA;VENTOLIN HFA) 108 (90 BASE) MCG/ACT inhaler Inhale 2 puffs into the lungs every 4 (four) hours as needed. For shortness of breath   Yes Historical Provider, MD  aspirin EC 81 MG tablet Take 81 mg by mouth daily.   Yes Historical Provider, MD  atorvastatin (LIPITOR) 80 MG tablet Take 40 mg by mouth at bedtime.    Yes Historical Provider, MD  Cholecalciferol (VITAMIN D-3) 1000 UNITS CAPS Take 1 capsule by mouth 3 (three) times daily.    Yes Historical Provider, MD  docusate sodium (COLACE) 100 MG capsule Take 100 mg by mouth 2 (two) times daily.   Yes Historical Provider, MD  fish oil-omega-3 fatty acids 1000 MG capsule Take 2 g by mouth 3 (three) times daily.   Yes Historical Provider, MD  glipiZIDE (GLUCOTROL) 10 MG tablet Take 10 mg by mouth 2 (two) times daily before a meal.   Yes Historical Provider, MD  insulin glargine (LANTUS) 100 units/mL SOLN Inject 12 Units into the skin daily at 10 pm.    Yes Historical Provider, MD  lisinopril (PRINIVIL,ZESTRIL) 10 MG tablet Take 5 mg by mouth daily.   Yes  Historical Provider, MD  Menthol-Methyl Salicylate (MUSCLE RUB) 10-15 % CREA Apply 1 application topically as needed for muscle pain (knee and elbow pain).   Yes Historical Provider, MD  metFORMIN (GLUCOPHAGE) 1000 MG tablet Take 1,000 mg by mouth 2 (two) times daily with a meal.   Yes Historical Provider, MD  mometasone (ASMANEX) 220 MCG/INH inhaler Inhale 2 puffs into the lungs at bedtime.     Yes Historical Provider, MD  Multiple Vitamins-Minerals (MULTIVITAMINS THER. W/MINERALS) TABS Take 1 tablet by mouth daily.   Yes Historical Provider, MD  oxyCODONE-acetaminophen (PERCOCET) 10-325 MG per tablet Take 1 tablet by mouth 3 (three) times daily.   Yes Historical Provider, MD  sildenafil (VIAGRA) 100 MG tablet Take 100 mg by mouth daily as needed for erectile dysfunction.   Yes Historical Provider, MD  terazosin (HYTRIN) 5 MG capsule Take 5 mg by mouth at bedtime.   Yes Historical Provider, MD  urea (CARMOL) 40 % CREA Apply 1 application topically daily. Apply To Feet.   Yes Historical Provider, MD  ondansetron (ZOFRAN ODT) 4 MG disintegrating tablet 4mg  ODT q4 hours prn nausea/vomit Patient not taking: Reported on 04/29/2015 12/14/13   Richardean Canal, MD   BP 163/86 mmHg  Pulse 89  Temp(Src) 98.2 F (36.8 C) (Oral)  Resp 18  SpO2 99% Physical Exam  Constitutional: He appears well-developed and well-nourished. No distress.  HENT:  Head: Normocephalic and atraumatic.  Eyes: Conjunctivae are normal.  Neck: Neck supple.  Cardiovascular: Normal rate, regular rhythm and normal heart sounds.   Pulmonary/Chest: Effort normal. No respiratory distress. He has no wheezes. He has no rales.  Abdominal: Soft. Bowel sounds are normal. He exhibits no distension. There is tenderness. There is no rebound.  RUQ tenderness, guarding  Musculoskeletal: He exhibits no edema.  Neurological: He is alert.  Skin: Skin is warm and dry.  Nursing note and vitals reviewed.   ED Course  Procedures (including critical care time) Labs Review Labs Reviewed  LIPASE, BLOOD  COMPREHENSIVE METABOLIC PANEL  CBC  URINALYSIS, ROUTINE W REFLEX MICROSCOPIC (NOT AT [facility])    Imaging Review No results found.   EKG Interpretation None      MDM   Final diagnoses:  Right upper quadrant pain     patient with right upper quadrant abdominal pain for 2 days, exam with some guarding. Concerning her  cholecystitis. Patient is hypertensive, otherwise normal vital signs. Will get labs including CMP and lipase, will get ultrasound abdomen.  10:00 PM Pts [place] negative. Labs still not in the lab. Will check on that. Pt having pain. Will order morphine.   12:29 AM Blood work still not in the lab. Not sure where labs are. RN to redraw.   CT pending. Signed out to PA upstill at shift change.   Filed Vitals:   04/29/15 2315 04/30/15 0124 04/30/15 0314 04/30/15 0314  BP: 162/102 179/99 136/86 136/86  Pulse: 82 76 96   Temp:      TempSrc:      Resp: 18 18    SpO2: 100% 100%  100%     Jaynie Crumble, PA-C 05/01/15 1529  Melene Plan, DO 05/01/15 1539

## 2015-04-29 NOTE — ED Notes (Signed)
Patient states he has abdominal pain x 3-4 days.  He was seen at the [facility] hospital today in [place] and was told to come to the ER.  Patient states he was given a liquid pill and fluid to flush him out and no blockage was seen on the x-ray.  They told him it might be his gallbladder.  He has increased pain associated with some nausea.

## 2015-04-30 ENCOUNTER — Emergency Department (HOSPITAL_COMMUNITY): Payer: Medicare Other

## 2015-04-30 ENCOUNTER — Encounter (HOSPITAL_COMMUNITY): Payer: Self-pay

## 2015-04-30 DIAGNOSIS — R109 Unspecified abdominal pain: Secondary | ICD-10-CM | POA: Diagnosis not present

## 2015-04-30 LAB — COMPREHENSIVE METABOLIC PANEL
ALT: 48 U/L (ref 17–63)
AST: 34 U/L (ref 15–41)
Albumin: 3.9 g/dL (ref 3.5–5.0)
Alkaline Phosphatase: 38 U/L (ref 38–126)
Anion gap: 7 (ref 5–15)
BILIRUBIN TOTAL: 0.9 mg/dL (ref 0.3–1.2)
BUN: 9 mg/dL (ref 6–20)
CALCIUM: 9.3 mg/dL (ref 8.9–10.3)
CHLORIDE: 102 mmol/L (ref 101–111)
CO2: 28 mmol/L (ref 22–32)
Creatinine, Ser: 0.97 mg/dL (ref 0.61–1.24)
GFR calc Af Amer: >60 mL/min (ref >60)
GLUCOSE: 173 mg/dL — AB (ref 65–99)
Potassium: 3.9 mmol/L (ref 3.5–5.1)
SODIUM: 137 mmol/L (ref 135–145)
Total Protein: 8.2 g/dL — ABNORMAL HIGH (ref 6.5–8.1)

## 2015-04-30 LAB — I-STAT CHEM 8, ED
BUN: 8 mg/dL (ref 6–20)
Calcium, Ion: 1.21 mmol/L (ref 1.13–1.30)
Chloride: 102 mmol/L (ref 101–111)
Creatinine, Ser: 0.9 mg/dL (ref 0.61–1.24)
GLUCOSE: 174 mg/dL — AB (ref 65–99)
HEMATOCRIT: 43 % (ref 39.0–52.0)
Hemoglobin: 14.6 g/dL (ref 13.0–17.0)
Potassium: 4 mmol/L (ref 3.5–5.1)
SODIUM: 139 mmol/L (ref 135–145)
TCO2: 27 mmol/L (ref 0–100)

## 2015-04-30 LAB — CBC WITH DIFFERENTIAL/PLATELET
BASOS ABS: 0 10*3/uL (ref 0.0–0.1)
Basophils Relative: 0 % (ref 0–1)
EOS ABS: 0.1 10*3/uL (ref 0.0–0.7)
Eosinophils Relative: 1 % (ref 0–5)
HCT: 38.8 % — ABNORMAL LOW (ref 39.0–52.0)
HEMOGLOBIN: 13.6 g/dL (ref 13.0–17.0)
LYMPHS PCT: 38 % (ref 12–46)
Lymphs Abs: 2.9 10*3/uL (ref 0.7–4.0)
MCH: 32.1 pg (ref 26.0–34.0)
MCHC: 35.1 g/dL (ref 30.0–36.0)
MCV: 91.5 fL (ref 78.0–100.0)
MONO ABS: 0.6 10*3/uL (ref 0.1–1.0)
MONOS PCT: 8 % (ref 3–12)
NEUTROS PCT: 53 % (ref 43–77)
Neutro Abs: 4 10*3/uL (ref 1.7–7.7)
PLATELETS: 91 10*3/uL — AB (ref 150–400)
RBC: 4.24 MIL/uL (ref 4.22–5.81)
RDW: 12.7 % (ref 11.5–15.5)
WBC: 7.6 10*3/uL (ref 4.0–10.5)

## 2015-04-30 LAB — LIPASE, BLOOD: LIPASE: 67 U/L — AB (ref 22–51)

## 2015-04-30 MED ORDER — DICYCLOMINE HCL 20 MG PO TABS: 20.0000 mg | ORAL_TABLET | Freq: Two times a day (BID) | ORAL | Status: DC

## 2015-04-30 MED ORDER — IOHEXOL 300 MG/ML  SOLN
100.0000 mL | Freq: Once | INTRAMUSCULAR | Status: AC | PRN
  Administered 2015-04-30: 100 mL via INTRAVENOUS

## 2015-04-30 MED ORDER — MORPHINE SULFATE 4 MG/ML IJ SOLN
4.0000 mg | Freq: Once | INTRAMUSCULAR | Status: AC
  Administered 2015-04-30: 4 mg via INTRAVENOUS
  Filled 2015-04-30: qty 1

## 2015-04-30 NOTE — Discharge Instructions (Signed)
RECOMMEND USE OF GAS-EX AND/OR MAALOX FOR FURTHER SYMPTOM RELIEF. RETURN HERE WITH ANY SEVERE PAIN, HIGH FEVER, OR NEW SYMPTOMS.   Abdominal Pain Many things can cause abdominal pain. Usually, abdominal pain is not caused by a disease and will improve without treatment. It can often be observed and treated at home. Your health care provider will do a physical exam and possibly order blood tests and X-rays to help determine the seriousness of your pain. However, in many cases, more time must pass before a clear cause of the pain can be found. Before that point, your health care provider may not know if you need more testing or further treatment. HOME CARE INSTRUCTIONS  Monitor your abdominal pain for any changes. The following actions may help to alleviate any discomfort you are experiencing:  Only take over-the-counter or prescription medicines as directed by your health care provider.  Do not take laxatives unless directed to do so by your health care provider.  Try a clear liquid diet (broth, tea, or water) as directed by your health care provider. Slowly move to a bland diet as tolerated. SEEK MEDICAL CARE IF:  You have unexplained abdominal pain.  You have abdominal pain associated with nausea or diarrhea.  You have pain when you urinate or have a bowel movement.  You experience abdominal pain that wakes you in the night.  You have abdominal pain that is worsened or improved by eating food.  You have abdominal pain that is worsened with eating fatty foods.  You have a fever. SEEK IMMEDIATE MEDICAL CARE IF:   Your pain does not go away within 2 hours.  You keep throwing up (vomiting).  Your pain is felt only in portions of the abdomen, such as the right side or the left lower portion of the abdomen.  You pass bloody or black tarry stools. MAKE SURE YOU:  Understand these instructions.   Will watch your condition.   Will get help right away if you are not doing well or  get worse.  Document Released: 06/23/2005 Document Revised: 09/18/2013 Document Reviewed: 05/23/2013 [facility] Patient Information 2015 [place], [place]. This information is not intended to replace advice given to you by your health care provider. Make sure you discuss any questions you have with your health care provider. US Abdomen Complete  04/29/2015   CLINICAL DATA:  Acute onset of generalized abdominal pain for 3-4 days. Initial encounter.  EXAM: ULTRASOUND ABDOMEN COMPLETE  COMPARISON:  CT of the abdomen and pelvis performed 12/14/2013, and abdominal ultrasound performed 02/25/2011  FINDINGS: Gallbladder: No gallstones or wall thickening visualized. No sonographic Murphy sign noted.  Common bile duct: Diameter: 0.4 cm, within normal limits in caliber.  Liver: No focal lesion identified. Somewhat heterogeneous parenchymal echogenicity noted, nonspecific in appearance.  IVC: No abnormality visualized.  Pancreas: Visualized portion unremarkable.  Spleen: Size and appearance within normal limits.  Right Kidney: Length: 12.9 cm. Echogenicity within normal limits. A 2.5 cm cyst is noted at the interpole region of the right kidney. No hydronephrosis visualized.  Left Kidney: Length: 12.7 cm. Echogenicity within normal limits. No mass or hydronephrosis visualized.  Abdominal aorta: Not well characterized due to overlying bowel gas.  Other findings: None.  IMPRESSION: 1. No acute abnormality seen within the abdomen. 2. Small right renal cyst noted.   Electronically Signed   By: Roanna Raider M.D.   On: 04/29/2015 22:17   Ct Abdomen Pelvis W Contrast  04/30/2015   CLINICAL DATA:  Abdominal pain for 3-4  days.  EXAM: CT ABDOMEN AND PELVIS WITH CONTRAST  TECHNIQUE: Multidetector CT imaging of the abdomen and pelvis was performed using the standard protocol following bolus administration of intravenous contrast.  CONTRAST:  OMNIPAQUE IOHEXOL 300 MG/ML  SOLN  COMPARISON:  12/14/2013  FINDINGS: Lower chest:   Normal.  Hepatobiliary: There are normal appearances of the liver, gallbladder and bile ducts.  Pancreas: Normal  Spleen: Normal  Adrenals/Urinary Tract: The adrenals and kidneys are normal in appearance, with incidentally noted 2.6 cm benign right renal cyst. There is no urinary calculus evident. There is no hydronephrosis or ureteral dilatation. Collecting systems and ureters appear unremarkable.  Stomach/Bowel: There are normal appearances of the stomach, small bowel and colon. The appendix is normal.  Vascular/Lymphatic: The abdominal aorta is normal in caliber. There is mild atherosclerotic calcification. There is no adenopathy in the abdomen or pelvis.  Reproductive: Unremarkable  Other: No acute inflammatory changes are evident in the abdomen or pelvis. There is no ascites.  Musculoskeletal: No significant abnormality  IMPRESSION: No significant abnormality   Electronically Signed   By: Ellery Plunk M.D.   On: 04/30/2015 01:29   Results for orders placed or performed during the hospital encounter of 04/29/15  Urinalysis, Routine w reflex microscopic (not at [facility])  Result Value Ref Range   Color, Urine AMBER (A) YELLOW   APPearance CLEAR CLEAR   Specific Gravity, Urine 1.013 1.005 - 1.030   pH 7.0 5.0 - 8.0   Glucose, UA NEGATIVE NEGATIVE mg/dL   Hgb urine dipstick NEGATIVE NEGATIVE   Bilirubin Urine NEGATIVE NEGATIVE   Ketones, ur NEGATIVE NEGATIVE mg/dL   Protein, ur 30 (A) NEGATIVE mg/dL   Urobilinogen, UA 0.2 0.0 - 1.0 mg/dL   Nitrite NEGATIVE NEGATIVE   Leukocytes, UA NEGATIVE NEGATIVE  Urine microscopic-add on  Result Value Ref Range   Squamous Epithelial / LPF RARE RARE  CBC with Differential  Result Value Ref Range   WBC 7.6 4.0 - 10.5 K/uL   RBC 4.24 4.22 - 5.81 MIL/uL   Hemoglobin 13.6 13.0 - 17.0 g/dL   HCT 16.1 (L) 09.6 - 04.5 %   MCV 91.5 78.0 - 100.0 fL   MCH 32.1 26.0 - 34.0 pg   MCHC 35.1 30.0 - 36.0 g/dL   RDW 40.9 81.1 - 91.4 %   Platelets 91 (L) 150 - 400  K/uL   Neutrophils Relative % 53 43 - 77 %   Lymphocytes Relative 38 12 - 46 %   Monocytes Relative 8 3 - 12 %   Eosinophils Relative 1 0 - 5 %   Basophils Relative 0 0 - 1 %   Neutro Abs 4.0 1.7 - 7.7 K/uL   Lymphs Abs 2.9 0.7 - 4.0 K/uL   Monocytes Absolute 0.6 0.1 - 1.0 K/uL   Eosinophils Absolute 0.1 0.0 - 0.7 K/uL   Basophils Absolute 0.0 0.0 - 0.1 K/uL   Smear Review LARGE PLATELETS PRESENT   Comprehensive metabolic panel  Result Value Ref Range   Sodium 137 135 - 145 mmol/L   Potassium 3.9 3.5 - 5.1 mmol/L   Chloride 102 101 - 111 mmol/L   CO2 28 22 - 32 mmol/L   Glucose, Bld 173 (H) 65 - 99 mg/dL   BUN 9 6 - 20 mg/dL   Creatinine, Ser 7.82 0.61 - 1.24 mg/dL   Calcium 9.3 8.9 - 95.6 mg/dL   Total Protein 8.2 (H) 6.5 - 8.1 g/dL   Albumin 3.9 3.5 - 5.0  g/dL   AST 34 15 - 41 U/L   ALT 48 17 - 63 U/L   Alkaline Phosphatase 38 38 - 126 U/L   Total Bilirubin 0.9 0.3 - 1.2 mg/dL   GFR calc non Af Amer >60 >60 mL/min   GFR calc Af Amer >60 >60 mL/min   Anion gap 7 5 - 15  Lipase, blood  Result Value Ref Range   Lipase 67 (H) 22 - 51 U/L  I-Stat Chem 8, ED  Result Value Ref Range   Sodium 139 135 - 145 mmol/L   Potassium 4.0 3.5 - 5.1 mmol/L   Chloride 102 101 - 111 mmol/L   BUN 8 6 - 20 mg/dL   Creatinine, Ser 1.61 0.61 - 1.24 mg/dL   Glucose, Bld 096 (H) 65 - 99 mg/dL   Calcium, Ion 0.45 4.09 - 1.30 mmol/L   TCO2 27 0 - 100 mmol/L   Hemoglobin 14.6 13.0 - 17.0 g/dL   HCT 81.1 91.4 - 78.2 %

## 2015-04-30 NOTE — ED Provider Notes (Signed)
RUQ abdominal pain x 2 days, nausea without vomiting ? Constipation - laxatives. BM's today with worsening pain ?Gall bladder - negative work up [employer] pending, CT scan ordered Pain managed No fever.   Re-evaluation:  CT negative. The patient is having no pain currently. Discussed possible medications to help alleviate symptoms, along with GI follow up which he states he will pursue at the [place]. He is stable for discharge home.   Elpidio Anis, PA-C 04/30/15 4132  Melene Plan, DO 04/30/15 2108

## 2016-07-28 ENCOUNTER — Emergency Department (HOSPITAL_COMMUNITY)
Admission: EM | Admit: 2016-07-28 | Discharge: 2016-07-29 | Disposition: A | Discharge status: Home / Self Care | Payer: Medicare Other | Attending: Emergency Medicine | Admitting: Emergency Medicine

## 2016-07-28 ENCOUNTER — Encounter (HOSPITAL_COMMUNITY): Payer: Self-pay

## 2016-07-28 DIAGNOSIS — Z87891 Personal history of nicotine dependence: Secondary | ICD-10-CM | POA: Diagnosis not present

## 2016-07-28 DIAGNOSIS — Y929 Unspecified place or not applicable: Secondary | ICD-10-CM | POA: Insufficient documentation

## 2016-07-28 DIAGNOSIS — Z79899 Other long term (current) drug therapy: Secondary | ICD-10-CM | POA: Insufficient documentation

## 2016-07-28 DIAGNOSIS — Z7982 Long term (current) use of aspirin: Secondary | ICD-10-CM | POA: Insufficient documentation

## 2016-07-28 DIAGNOSIS — Z794 Long term (current) use of insulin: Secondary | ICD-10-CM | POA: Diagnosis not present

## 2016-07-28 DIAGNOSIS — W1802XA Striking against glass with subsequent fall, initial encounter: Secondary | ICD-10-CM | POA: Diagnosis not present

## 2016-07-28 DIAGNOSIS — Y939 Activity, unspecified: Secondary | ICD-10-CM | POA: Insufficient documentation

## 2016-07-28 DIAGNOSIS — Y999 Unspecified external cause status: Secondary | ICD-10-CM | POA: Insufficient documentation

## 2016-07-28 DIAGNOSIS — S51812A Laceration without foreign body of left forearm, initial encounter: Secondary | ICD-10-CM | POA: Diagnosis not present

## 2016-07-28 DIAGNOSIS — E119 Type 2 diabetes mellitus without complications: Secondary | ICD-10-CM | POA: Diagnosis not present

## 2016-07-28 DIAGNOSIS — J45909 Unspecified asthma, uncomplicated: Secondary | ICD-10-CM | POA: Insufficient documentation

## 2016-07-28 NOTE — ED Triage Notes (Signed)
Pt fell into a glass table and has a laceration on his left forearm, bleeding controlled at this time

## 2016-07-28 NOTE — ED Provider Notes (Signed)
[facility]-EMERGENCY DEPT Provider Note   CSN: [id number] Arrival date & time: 07/28/16  2128 By signing my name below, I, Levon Hedger, attest that this documentation has been prepared under the direction and in the presence of non-physician practitioner, Arvilla Meres, PA-C  Electronically Signed: Levon Hedger, Scribe. 07/29/2016. 12:01 AM.   History   Chief Complaint Chief Complaint  Patient presents with  . Extremity Laceration   HPI Justin Clayton is a 65 y.o. male who presents to the Emergency Department with a laceration of his left forearm sustained today. He states he had been drinking when he fell into a glass table, breaking the table and cutting his arm. Pt rates his pain as 10/10 in severity. No treatments tried PTA.  No head injury, weakness, numbness, or LOC. Pt is not on any blood thinners. Tetanus UTD. H/o DM. Pt has no other complaints at this time.   The history is provided by the patient. No language interpreter was used.   Past Medical History:  Diagnosis Date  . Asthma   . Diabetes mellitus   . High cholesterol    There are no active problems to display for this patient.  Past Surgical History:  Procedure Laterality Date  . FRACTURE SURGERY Right    R leg  . ROTATOR CUFF REPAIR Bilateral     Home Medications    Prior to Admission medications   Medication Sig Start Date End Date Taking? Authorizing Provider  acyclovir (ZOVIRAX) 800 MG tablet Take 400 mg by mouth daily.    Historical Provider, MD  albuterol (PROVENTIL HFA;VENTOLIN HFA) 108 (90 BASE) MCG/ACT inhaler Inhale 2 puffs into the lungs every 4 (four) hours as needed. For shortness of breath    Historical Provider, MD  aspirin EC 81 MG tablet Take 81 mg by mouth daily.    Historical Provider, MD  atorvastatin (LIPITOR) 80 MG tablet Take 40 mg by mouth at bedtime.     Historical Provider, MD  cephALEXin (KEFLEX) 500 MG capsule Take 1 capsule (500 mg total) by mouth 2 (two) times daily. 07/29/16  08/03/16  Lona Kettle, PA-C  Cholecalciferol (VITAMIN D-3) 1000 UNITS CAPS Take 1 capsule by mouth 3 (three) times daily.     Historical Provider, MD  dicyclomine (BENTYL) 20 MG tablet Take 1 tablet (20 mg total) by mouth 2 (two) times daily. 04/30/15   Elpidio Anis, PA-C  docusate sodium (COLACE) 100 MG capsule Take 100 mg by mouth 2 (two) times daily.    Historical Provider, MD  fish oil-omega-3 fatty acids 1000 MG capsule Take 2 g by mouth 3 (three) times daily.    Historical Provider, MD  glipiZIDE (GLUCOTROL) 10 MG tablet Take 10 mg by mouth 2 (two) times daily before a meal.    Historical Provider, MD  insulin glargine (LANTUS) 100 units/mL SOLN Inject 12 Units into the skin daily at 10 pm.     Historical Provider, MD  lisinopril (PRINIVIL,ZESTRIL) 10 MG tablet Take 5 mg by mouth daily.    Historical Provider, MD  Menthol-Methyl Salicylate (MUSCLE RUB) 10-15 % CREA Apply 1 application topically as needed for muscle pain (knee and elbow pain).    Historical Provider, MD  metFORMIN (GLUCOPHAGE) 1000 MG tablet Take 1,000 mg by mouth 2 (two) times daily with a meal.    Historical Provider, MD  mometasone (ASMANEX) 220 MCG/INH inhaler Inhale 2 puffs into the lungs at bedtime.     Historical Provider, MD  Multiple Vitamins-Minerals (MULTIVITAMINS THER. W/MINERALS) TABS  Take 1 tablet by mouth daily.    Historical Provider, MD  ondansetron (ZOFRAN ODT) 4 MG disintegrating tablet 4mg  ODT q4 hours prn nausea/vomit Patient not taking: Reported on 04/29/2015 12/14/13   Charlynne Panderavid Hsienta Yao, MD  oxyCODONE-acetaminophen (PERCOCET) 10-325 MG per tablet Take 1 tablet by mouth 3 (three) times daily.    Historical Provider, MD  sildenafil (VIAGRA) 100 MG tablet Take 100 mg by mouth daily as needed for erectile dysfunction.    Historical Provider, MD  terazosin (HYTRIN) 5 MG capsule Take 5 mg by mouth at bedtime.    Historical Provider, MD  urea (CARMOL) 40 % CREA Apply 1 application topically daily. Apply To  Feet.    Historical Provider, MD    Family History History reviewed. No pertinent family history.  Social History Social History  Substance Use Topics  . Smoking status: Former Games developermoker  . Smokeless tobacco: Never Used  . Alcohol use No    Allergies   Review of patient's allergies indicates no known allergies.   Review of Systems Review of Systems  Constitutional: Negative for fever.  Musculoskeletal: Positive for myalgias.  Skin: Positive for wound.  Allergic/Immunologic: Positive for immunocompromised state ( diabetes).  Neurological: Negative for syncope, weakness and numbness.   Physical Exam Updated Vital Signs BP 157/94 (BP Location: Right Arm)   Pulse 81   Temp 97.5 F (36.4 C) (Oral)   Resp 18   Wt 101.4 kg   SpO2 100%   Physical Exam  Constitutional: He appears well-developed and well-nourished. No distress.  HENT:  Head: Normocephalic and atraumatic.  Mouth/Throat: Oropharynx is clear and moist. No oropharyngeal exudate.  Eyes: Conjunctivae and EOM are normal. Pupils are equal, round, and reactive to light. Right eye exhibits no discharge. Left eye exhibits no discharge. No scleral icterus.  Neck: Normal range of motion and phonation normal. No neck rigidity. Normal range of motion present.  Cardiovascular: Normal rate and intact distal pulses.   Pulmonary/Chest: Effort normal. No respiratory distress.  Abdominal: He exhibits no distension.  Musculoskeletal: Normal range of motion.       Arms: Lymphadenopathy:    He has no cervical adenopathy.  Neurological: He is alert. He has normal strength. He is not disoriented. He displays no atrophy. No sensory deficit. Coordination and gait normal. GCS eye subscore is 4. GCS verbal subscore is 5. GCS motor subscore is 6.  Mental Status:  Alert, thought content appropriate, able to give a coherent history. Speech fluent without evidence of aphasia. Able to follow 2 step commands without difficulty.  Cranial Nerves:    II: pupils equal, round, reactive to light III,IV, VI: ptosis not present, extra-ocular motions intact bilaterally  V,VII: smile symmetric, facial light touch sensation equal VIII: hearing grossly normal to voice  IX, X: uvula elevates symmetrically  XI: bilateral shoulder shrug symmetric and strong XII: midline tongue extension without fassiculations  Skin: Skin is warm and dry. He is not diaphoretic.  Psychiatric: He has a normal mood and affect. His behavior is normal.    ED Treatments / Results  DIAGNOSTIC STUDIES:  Oxygen Saturation is 100% on RA, normal by my interpretation.    COORDINATION OF CARE:  12:00 AM Discussed treatment plan with pt at bedside and pt agreed to plan.   Labs (all labs ordered are listed, but only abnormal results are displayed) Labs Reviewed - No data to display  EKG  EKG Interpretation None       Radiology Dg Forearm Left  Result  Date: 07/29/2016 CLINICAL DATA:  Larey Seat through glass table, with laceration to the left forearm. Initial encounter. EXAM: LEFT FOREARM - 2 VIEW COMPARISON:  Left elbow radiographs performed 06/18/2013 FINDINGS: The known soft tissue laceration is not well characterized. A tiny radiopaque foreign body is noted at the dorsum of the mid forearm. There is no evidence of fracture or dislocation. The radius and ulna appear grossly intact. The elbow joint is incompletely assessed, but appears grossly unremarkable. The carpal rows appear grossly intact, and demonstrate normal alignment. IMPRESSION: Tiny radiopaque foreign body at the dorsum of the mid forearm, likely at the laceration. No evidence of fracture or dislocation. Electronically Signed   By: Roanna Raider M.D.   On: 07/29/2016 00:16    Procedures .Marland KitchenLaceration Repair Date/Time: 07/29/2016 2:01 PM Performed by: Lona Kettle Authorized by: Lona Kettle   Consent:    Consent obtained:  Verbal   Consent given by:  Patient   Risks discussed:   Infection, pain, poor cosmetic result and retained foreign body   Alternatives discussed:  No treatment Anesthesia (see MAR for exact dosages):    Anesthesia method:  Local infiltration   Local anesthetic:  Lidocaine 1% WITH epi Laceration details:    Location:  Shoulder/arm   Shoulder/arm location:  L lower arm (proximal forearm)   Length (cm):  3   Depth (mm):  1 Repair type:    Repair type:  Simple Pre-procedure details:    Preparation:  Patient was prepped and draped in usual sterile fashion and imaging obtained to evaluate for foreign bodies Exploration:    Hemostasis achieved with:  Epinephrine and direct pressure   Wound exploration: wound explored through full range of motion and entire depth of wound probed and visualized     Wound extent: no foreign bodies/material noted, no muscle damage noted, no nerve damage noted, no tendon damage noted and no underlying fracture noted     Contaminated: no   Treatment:    Area cleansed with:  Saline   Amount of cleaning:  Standard   Irrigation solution:  Sterile saline   Irrigation volume:  500   Irrigation method:  Syringe   Foreign body removal: no foreign bodies visualized.   Skin repair:    Repair method:  Sutures   Suture size:  4-0   Suture material:  Prolene   Suture technique:  Simple interrupted   Number of sutures:  3 Approximation:    Approximation:  Close   Vermilion border: well-aligned   Post-procedure details:    Dressing:  Antibiotic ointment and bulky dressing   Patient tolerance of procedure:  Tolerated well, no immediate complications .Marland KitchenLaceration Repair Date/Time: 07/29/2016 2:02 PM Performed by: Lona Kettle Authorized by: Lona Kettle   Consent:    Consent obtained:  Verbal   Consent given by:  Patient   Risks discussed:  Infection, pain, retained foreign body and poor cosmetic result   Alternatives discussed:  No treatment Anesthesia (see MAR for exact dosages):    Anesthesia method:   Local infiltration   Local anesthetic:  Lidocaine 1% WITH epi Laceration details:    Location:  Shoulder/arm   Shoulder/arm location:  L lower arm   Length (cm):  6   Depth (mm):  1 Repair type:    Repair type:  Simple Pre-procedure details:    Preparation:  Patient was prepped and draped in usual sterile fashion and imaging obtained to evaluate for foreign bodies Exploration:    Hemostasis achieved  with:  Epinephrine and direct pressure   Wound exploration: wound explored through full range of motion and entire depth of wound probed and visualized     Wound extent: no foreign bodies/material noted, no muscle damage noted, no nerve damage noted, no tendon damage noted and no vascular damage noted     Contaminated: no   Treatment:    Area cleansed with:  Saline   Amount of cleaning:  Standard   Irrigation solution:  Sterile saline   Irrigation volume:  500   Irrigation method:  Syringe   Foreign body removal: no foreign bodies visualized.   Skin repair:    Repair method:  Sutures   Suture size:  4-0   Suture material:  Prolene   Suture technique:  Simple interrupted   Number of sutures:  6 Approximation:    Approximation:  Close   Vermilion border: well-aligned   Post-procedure details:    Dressing:  Antibiotic ointment and bulky dressing   Patient tolerance of procedure:  Tolerated well, no immediate complications   (including critical care time)  Medications Ordered in ED Medications  lidocaine-EPINEPHrine (XYLOCAINE W/EPI) 2 %-1:200000 (PF) injection 10 mL (1 mL Infiltration Given 07/29/16 0140)  acetaminophen (TYLENOL) tablet 650 mg (650 mg Oral Given 07/29/16 0032)  cephALEXin (KEFLEX) capsule 500 mg (500 mg Oral Given 07/29/16 0143)     Initial Impression / Assessment and Plan / ED Course  I have reviewed the triage vital signs and the nursing notes.  Pertinent labs & imaging results that were available during my care of the patient were reviewed by me and  considered in my medical decision making (see chart for details).  Clinical Course  Comment By Time  No obvious fracture or dislocation. Radio-opaque foreign body at mid forearm - however, this does not coincide with laceration site at proximal forearm.  Lona Kettle, [place] 11/02 0030    Patient presents to ED with two lacerations s/p fall this evening. Bleeding is controlled. Not on anti-coagulation therapy. Patient is afebrile and non-toxic appearing in NAD. Vital signs remarkable for elevated BP, otherwise stable. Physical exam remarkable for two lacerations (3cm and 6cm) to dorsal surface of left proximal forearm. X-ray shows no underlying fracture or dislocation; of incidental note radio-opaque foreign body at mid fore-arm; however, this does not coincide with laceration location. No palpable foreign body appreciated at this site coinciding with x-ray.   Wounds irrigated. Wound explored and base of wound visualized in a bloodless field without evidence of foreign body.  Laceration occurred < 8 hours prior to repair which was well tolerated. Total of 9 stitches placed - 3 in 3cm laceration and 6 in 6cm laceration. Tdap UTD.  Pt has h/o DM, will be d/c'd on ABX. Discussed suture home care with patient and answered questions. Pt to follow-up for wound check in 2-3 days and suture removal in 10-14 days; they are to return to the ED sooner for signs of infection. Symptomatic management with tylenol/motrin for pain relief. Return precautions given. Patient voiced understanding and is agreeable. Pt is hemodynamically stable with no complaints prior to dc.       Final Clinical Impressions(s) / ED Diagnoses   Final diagnoses:  Laceration of left forearm, initial encounter    New Prescriptions Discharge Medication List as of 07/29/2016  1:36 AM    I personally performed the services described in this documentation, which was scribed in my presence. The recorded information has been reviewed and  is accurate.  Lona Kettleshley Laurel Meyer, [place]-C 07/29/16 1412    Cy BlamerApril Palumbo, MD 07/29/16 2350

## 2016-07-29 ENCOUNTER — Emergency Department (HOSPITAL_COMMUNITY): Payer: Medicare Other

## 2016-07-29 DIAGNOSIS — S51812A Laceration without foreign body of left forearm, initial encounter: Secondary | ICD-10-CM | POA: Diagnosis not present

## 2016-07-29 MED ORDER — CEPHALEXIN 500 MG PO CAPS
500.0000 mg | ORAL_CAPSULE | Freq: Once | ORAL | Status: AC
  Administered 2016-07-29: 500 mg via ORAL
  Filled 2016-07-29: qty 1

## 2016-07-29 MED ORDER — CEPHALEXIN 500 MG PO CAPS: 500.0000 mg | ORAL_CAPSULE | Freq: Two times a day (BID) | ORAL | 0 refills | Status: AC

## 2016-07-29 MED ORDER — LIDOCAINE-EPINEPHRINE (PF) 2 %-1:200000 IJ SOLN
10.0000 mL | Freq: Once | INTRAMUSCULAR | Status: AC
  Administered 2016-07-29: 1 mL
  Filled 2016-07-29: qty 20

## 2016-07-29 MED ORDER — ACETAMINOPHEN 325 MG PO TABS
650.0000 mg | ORAL_TABLET | Freq: Once | ORAL | Status: AC
  Administered 2016-07-29: 650 mg via ORAL
  Filled 2016-07-29: qty 2

## 2016-07-29 NOTE — ED Notes (Signed)
Lidocaine at bedside.

## 2016-07-29 NOTE — ED Notes (Signed)
Report given to Jen, Rn.

## 2016-07-29 NOTE — Discharge Instructions (Signed)
Read the information below.  No fractures noted on x-ray. Keep dressing on for next 24 hours. Following you can wash with warm soap and water, apply antibiotic ointment, and new dressing. You can take tylenol or motrin for pain.   You may have a small foreign body in the middle part of your forearm as identified on x-ray. Follow up with your primary doctor in 2-3 days for wound check. Sutures will need to be removed in 10-14 days.  You can also follow up with a hand specialist if you would like.  You may return to the Emergency Department at any time for worsening condition or any new symptoms that concern you. Return to ED if you develop signs of infection - redness, swelling, purulent discharge, streaking, or fever.

## 2016-07-29 NOTE — ED Notes (Signed)
PA at bedside.

## 2016-08-12 ENCOUNTER — Emergency Department (HOSPITAL_COMMUNITY)
Admission: EM | Admit: 2016-08-12 | Discharge: 2016-08-12 | Disposition: A | Discharge status: Home / Self Care | Payer: Medicare Other | Attending: Emergency Medicine | Admitting: Emergency Medicine

## 2016-08-12 ENCOUNTER — Encounter (HOSPITAL_COMMUNITY): Payer: Self-pay | Admitting: *Deleted

## 2016-08-12 DIAGNOSIS — Z79899 Other long term (current) drug therapy: Secondary | ICD-10-CM | POA: Insufficient documentation

## 2016-08-12 DIAGNOSIS — J45909 Unspecified asthma, uncomplicated: Secondary | ICD-10-CM | POA: Insufficient documentation

## 2016-08-12 DIAGNOSIS — E119 Type 2 diabetes mellitus without complications: Secondary | ICD-10-CM | POA: Diagnosis not present

## 2016-08-12 DIAGNOSIS — Z7982 Long term (current) use of aspirin: Secondary | ICD-10-CM | POA: Diagnosis not present

## 2016-08-12 DIAGNOSIS — Z87891 Personal history of nicotine dependence: Secondary | ICD-10-CM | POA: Diagnosis not present

## 2016-08-12 DIAGNOSIS — Z794 Long term (current) use of insulin: Secondary | ICD-10-CM | POA: Insufficient documentation

## 2016-08-12 DIAGNOSIS — Z4802 Encounter for removal of sutures: Secondary | ICD-10-CM | POA: Insufficient documentation

## 2016-08-12 NOTE — ED Provider Notes (Signed)
[facility]-EMERGENCY DEPT Provider Note   By signing my name below, I, Earmon PhoenixJennifer Waddell, attest that this documentation has been prepared under the direction and in the presence of Raeford RazorStephen Makell Cyr, MD. Electronically Signed: Earmon PhoenixJennifer Waddell, ED Scribe. 08/12/16. 9:40 AM.   History   Chief Complaint Chief Complaint  Patient presents with  . Suture / Staple Removal   The history is provided by the patient and medical records. No language interpreter was used.    HPI Comments:  Justin Clayton is a 65 y.o. male who presents to the Emergency Department needing 8 sutures removed from the left forearm that were placed about 15 days ago. Pt states he tripped and fell over a glass table. He has not done anything to treat the wound since having sutures placed. He denies modifying factors. He denies fever, chills, nausea, vomiting, draining or redness from the wound.  Past Medical History:  Diagnosis Date  . Asthma   . Diabetes mellitus   . High cholesterol     There are no active problems to display for this patient.   Past Surgical History:  Procedure Laterality Date  . FRACTURE SURGERY Right    R leg  . ROTATOR CUFF REPAIR Bilateral        Home Medications    Prior to Admission medications   Medication Sig Start Date End Date Taking? Authorizing Provider  acyclovir (ZOVIRAX) 800 MG tablet Take 400 mg by mouth daily.    Historical Provider, MD  albuterol (PROVENTIL HFA;VENTOLIN HFA) 108 (90 BASE) MCG/ACT inhaler Inhale 2 puffs into the lungs every 4 (four) hours as needed. For shortness of breath    Historical Provider, MD  aspirin EC 81 MG tablet Take 81 mg by mouth daily.    Historical Provider, MD  atorvastatin (LIPITOR) 80 MG tablet Take 40 mg by mouth at bedtime.     Historical Provider, MD  Cholecalciferol (VITAMIN D-3) 1000 UNITS CAPS Take 1 capsule by mouth 3 (three) times daily.     Historical Provider, MD  dicyclomine (BENTYL) 20 MG tablet Take 1 tablet (20 mg total) by  mouth 2 (two) times daily. 04/30/15   Elpidio AnisShari Upstill, PA-C  docusate sodium (COLACE) 100 MG capsule Take 100 mg by mouth 2 (two) times daily.    Historical Provider, MD  fish oil-omega-3 fatty acids 1000 MG capsule Take 2 g by mouth 3 (three) times daily.    Historical Provider, MD  glipiZIDE (GLUCOTROL) 10 MG tablet Take 10 mg by mouth 2 (two) times daily before a meal.    Historical Provider, MD  insulin glargine (LANTUS) 100 units/mL SOLN Inject 12 Units into the skin daily at 10 pm.     Historical Provider, MD  lisinopril (PRINIVIL,ZESTRIL) 10 MG tablet Take 5 mg by mouth daily.    Historical Provider, MD  Menthol-Methyl Salicylate (MUSCLE RUB) 10-15 % CREA Apply 1 application topically as needed for muscle pain (knee and elbow pain).    Historical Provider, MD  metFORMIN (GLUCOPHAGE) 1000 MG tablet Take 1,000 mg by mouth 2 (two) times daily with a meal.    Historical Provider, MD  mometasone (ASMANEX) 220 MCG/INH inhaler Inhale 2 puffs into the lungs at bedtime.     Historical Provider, MD  Multiple Vitamins-Minerals (MULTIVITAMINS THER. W/MINERALS) TABS Take 1 tablet by mouth daily.    Historical Provider, MD  ondansetron (ZOFRAN ODT) 4 MG disintegrating tablet 4mg  ODT q4 hours prn nausea/vomit Patient not taking: Reported on 04/29/2015 12/14/13   Charlynne Panderavid Hsienta Yao,  MD  oxyCODONE-acetaminophen (PERCOCET) 10-325 MG per tablet Take 1 tablet by mouth 3 (three) times daily.    Historical Provider, MD  sildenafil (VIAGRA) 100 MG tablet Take 100 mg by mouth daily as needed for erectile dysfunction.    Historical Provider, MD  terazosin (HYTRIN) 5 MG capsule Take 5 mg by mouth at bedtime.    Historical Provider, MD  urea (CARMOL) 40 % CREA Apply 1 application topically daily. Apply To Feet.    Historical Provider, MD    Family History No family history on file.  Social History Social History  Substance Use Topics  . Smoking status: Former Games developer  . Smokeless tobacco: Never Used  . Alcohol use No       Allergies   Patient has no known allergies.   Review of Systems Review of Systems A complete 10 system review of systems was obtained and all systems are negative except as noted in the HPI and PMH.   Physical Exam Updated Vital Signs BP (!) 178/110 (BP Location: Right Arm)   Pulse 95   Temp 98 F (36.7 C) (Oral)   Resp 17   SpO2 100%   Physical Exam  Constitutional: He is oriented to person, place, and time. He appears well-developed and well-nourished.  HENT:  Head: Normocephalic.  Eyes: EOM are normal.  Neck: Normal range of motion.  Pulmonary/Chest: Effort normal.  Abdominal: He exhibits no distension.  Musculoskeletal: Normal range of motion.  Neurological: He is alert and oriented to person, place, and time.  NVI  Skin: Skin is warm and dry.  Sutured wound to left forearm. Healing without complications. No redness, no drainage.  Psychiatric: He has a normal mood and affect.  Nursing note and vitals reviewed.    ED Treatments / Results  DIAGNOSTIC STUDIES: Oxygen Saturation is 100% on RA, normal by my interpretation.   COORDINATION OF CARE: 9:35 AM- Will remove sutures. Pt verbalizes understanding and agrees to plan.  Medications - No data to display  Labs (all labs ordered are listed, but only abnormal results are displayed) Labs Reviewed - No data to display  EKG  EKG Interpretation None       Radiology No results found.  Procedures .Suture Removal Date/Time: 08/12/2016 9:37 AM Performed by: Raeford Razor Authorized by: Raeford Razor   Consent:    Consent obtained:  Verbal   Consent given by:  Patient Location:    Location:  Upper extremity   Upper extremity location:  Arm   Arm location:  L lower arm Procedure details:    Wound appearance:  No signs of infection   Number of sutures removed:  8 Post-procedure details:    Post-removal:  Antibiotic ointment applied   Patient tolerance of procedure:  Tolerated well, no  immediate complications    (including critical care time)  Medications Ordered in ED Medications - No data to display   Initial Impression / Assessment and Plan / ED Course  I have reviewed the triage vital signs and the nursing notes.  Pertinent labs & imaging results that were available during my care of the patient were reviewed by me and considered in my medical decision making (see chart for details).  Clinical Course     65yM with suture removal . Wound appears to be healing without complication. Continued wound care return precautions discussed.  Final Clinical Impressions(s) / ED Diagnoses   Final diagnoses:  Encounter for removal of sutures   I personally preformed the services scribed in  my presence. The recorded information has been reviewed is accurate. Raeford RazorStephen Tovah Slavick, MD.  New Prescriptions New Prescriptions   No medications on file     Raeford RazorStephen Khayree Delellis, MD 08/23/16 1348

## 2016-08-12 NOTE — ED Triage Notes (Signed)
Pt is here for suture removal form left arm

## 2017-02-28 ENCOUNTER — Observation Stay (HOSPITAL_COMMUNITY)
Admission: EM | Admit: 2017-02-28 | Discharge: 2017-03-02 | Disposition: A | Discharge status: Home / Self Care | Payer: Medicare Other | Attending: Family Medicine | Admitting: Family Medicine

## 2017-02-28 ENCOUNTER — Emergency Department (HOSPITAL_COMMUNITY): Payer: Medicare Other

## 2017-02-28 ENCOUNTER — Encounter (HOSPITAL_COMMUNITY): Payer: Self-pay

## 2017-02-28 DIAGNOSIS — J45909 Unspecified asthma, uncomplicated: Secondary | ICD-10-CM

## 2017-02-28 DIAGNOSIS — R079 Chest pain, unspecified: Secondary | ICD-10-CM

## 2017-02-28 DIAGNOSIS — E119 Type 2 diabetes mellitus without complications: Secondary | ICD-10-CM

## 2017-02-28 DIAGNOSIS — J4520 Mild intermittent asthma, uncomplicated: Secondary | ICD-10-CM

## 2017-02-28 DIAGNOSIS — M549 Dorsalgia, unspecified: Secondary | ICD-10-CM | POA: Insufficient documentation

## 2017-02-28 DIAGNOSIS — I10 Essential (primary) hypertension: Secondary | ICD-10-CM | POA: Diagnosis present

## 2017-02-28 DIAGNOSIS — N179 Acute kidney failure, unspecified: Secondary | ICD-10-CM | POA: Diagnosis not present

## 2017-02-28 DIAGNOSIS — I2510 Atherosclerotic heart disease of native coronary artery without angina pectoris: Secondary | ICD-10-CM | POA: Diagnosis not present

## 2017-02-28 DIAGNOSIS — G8929 Other chronic pain: Secondary | ICD-10-CM | POA: Insufficient documentation

## 2017-02-28 DIAGNOSIS — I5031 Acute diastolic (congestive) heart failure: Secondary | ICD-10-CM | POA: Insufficient documentation

## 2017-02-28 DIAGNOSIS — R0789 Other chest pain: Secondary | ICD-10-CM | POA: Diagnosis not present

## 2017-02-28 DIAGNOSIS — E7800 Pure hypercholesterolemia, unspecified: Secondary | ICD-10-CM | POA: Insufficient documentation

## 2017-02-28 DIAGNOSIS — Z7982 Long term (current) use of aspirin: Secondary | ICD-10-CM | POA: Insufficient documentation

## 2017-02-28 DIAGNOSIS — I110 Hypertensive heart disease with heart failure: Secondary | ICD-10-CM | POA: Diagnosis not present

## 2017-02-28 DIAGNOSIS — E785 Hyperlipidemia, unspecified: Secondary | ICD-10-CM | POA: Diagnosis present

## 2017-02-28 DIAGNOSIS — F17210 Nicotine dependence, cigarettes, uncomplicated: Secondary | ICD-10-CM | POA: Insufficient documentation

## 2017-02-28 DIAGNOSIS — Z794 Long term (current) use of insulin: Secondary | ICD-10-CM | POA: Insufficient documentation

## 2017-02-28 DIAGNOSIS — B009 Herpesviral infection, unspecified: Secondary | ICD-10-CM | POA: Insufficient documentation

## 2017-02-28 DIAGNOSIS — Z86718 Personal history of other venous thrombosis and embolism: Secondary | ICD-10-CM | POA: Insufficient documentation

## 2017-02-28 DIAGNOSIS — F1410 Cocaine abuse, uncomplicated: Secondary | ICD-10-CM | POA: Insufficient documentation

## 2017-02-28 HISTORY — DX: Chest pain, unspecified: R07.9

## 2017-02-28 HISTORY — DX: Type 2 diabetes mellitus without complications: E11.9

## 2017-02-28 HISTORY — DX: Hyperlipidemia, unspecified: E78.5

## 2017-02-28 HISTORY — DX: Unspecified asthma, uncomplicated: J45.909

## 2017-02-28 HISTORY — DX: Essential (primary) hypertension: I10

## 2017-02-28 LAB — BASIC METABOLIC PANEL
ANION GAP: 12 (ref 5–15)
BUN: 14 mg/dL (ref 6–20)
CO2: 24 mmol/L (ref 22–32)
CREATININE: 1.52 mg/dL — AB (ref 0.61–1.24)
Calcium: 8.9 mg/dL (ref 8.9–10.3)
Chloride: 105 mmol/L (ref 101–111)
GFR, EST AFRICAN AMERICAN: 53 mL/min — AB (ref >60)
GFR, EST NON AFRICAN AMERICAN: 46 mL/min — AB (ref >60)
Glucose, Bld: 126 mg/dL — ABNORMAL HIGH (ref 65–99)
Potassium: 3.8 mmol/L (ref 3.5–5.1)
SODIUM: 141 mmol/L (ref 135–145)

## 2017-02-28 LAB — CBC
HCT: 35.2 % — ABNORMAL LOW (ref 39.0–52.0)
HEMOGLOBIN: 12 g/dL — AB (ref 13.0–17.0)
MCH: 31.3 pg (ref 26.0–34.0)
MCHC: 34.1 g/dL (ref 30.0–36.0)
MCV: 91.9 fL (ref 78.0–100.0)
Platelets: 174 10*3/uL (ref 150–400)
RBC: 3.83 MIL/uL — AB (ref 4.22–5.81)
RDW: 13 % (ref 11.5–15.5)
WBC: 7.1 10*3/uL (ref 4.0–10.5)

## 2017-02-28 LAB — TROPONIN I

## 2017-02-28 LAB — I-STAT TROPONIN, ED: Troponin i, poc: 0 ng/mL (ref 0.00–0.08)

## 2017-02-28 LAB — D-DIMER, QUANTITATIVE: D-Dimer, Quant: 0.7 ug/mL-FEU — ABNORMAL HIGH (ref 0.00–0.50)

## 2017-02-28 MED ORDER — ASPIRIN 81 MG PO CHEW
324.0000 mg | CHEWABLE_TABLET | Freq: Once | ORAL | Status: AC
  Administered 2017-02-28: 324 mg via ORAL
  Filled 2017-02-28: qty 4

## 2017-02-28 MED ORDER — ONDANSETRON HCL 4 MG/2ML IJ SOLN
4.0000 mg | Freq: Once | INTRAMUSCULAR | Status: AC
  Administered 2017-02-28: 4 mg via INTRAVENOUS
  Filled 2017-02-28: qty 2

## 2017-02-28 MED ORDER — SODIUM CHLORIDE 0.9 % IV BOLUS (SEPSIS)
1000.0000 mL | Freq: Once | INTRAVENOUS | Status: AC
  Administered 2017-03-01: 1000 mL via INTRAVENOUS

## 2017-02-28 NOTE — ED Triage Notes (Signed)
Pt states he has had palpitations today that have gotten worse throughout the day. Pt reports chest pain with inspiration. Pt denies hx of arrhythmias. Skin warm and dry. No acute distress noted.

## 2017-02-28 NOTE — ED Provider Notes (Signed)
[facility]ER[facility]ENCY DEPT Provider Note   CSN: [id number] Arrival date & time: 02/28/17  1726     History   Chief Complaint Chief Complaint  Patient presents with  . Chest Pain  . Palpitations    HPI Justin Clayton is a 66 y.o. male.  This a 65 year old male with a history of hypertension, diabetes, hypercholesterolemia and asthma who presents with palpitations and chest pain that started this morning He states he noticed left sided chest pain, nausea and diaphoresis that was intense earlier in the day.  This has waxed and waned in intensity throughout the day.  He also states that he now notices a "thunk" in his chest on occasion. Patient states that he has not had any change in his medication.  He does not travel.  He's not noticed any swelling in his legs.  He's had normal bowel movements.  He states he did have lunch, but was nauseated at dinnertime and did not eat.  He has continued to have this sensation throughout the day, sometimes for several seconds at a time and sometimes just one abnormal beat.  He does not have a history of arrhythmia.  He has had no heart attack.  He's had no stroke. 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  He was on narcotics, but has weaned himself off with the help of the [place] and is now taking gabapentin.  He requests that he dies not receive any narcotic unless absolutely necessary.      Past Medical History:  Diagnosis Date  . Asthma   . Diabetes mellitus   . High cholesterol     Patient Active Problem List   Diagnosis Date Noted  . Chest pain 02/28/2017  . HLD (hyperlipidemia) 02/28/2017  . DM2 (diabetes mellitus, type 2) (HCC) 02/28/2017  . Essential hypertension 02/28/2017  . Asthma 02/28/2017    Past Surgical History:  Procedure Laterality Date  . FRACTURE SURGERY Right    R leg  . ROTATOR CUFF REPAIR Bilateral        Home Medications    Prior to Admission medications   Medication Sig  Start Date End Date Taking? Authorizing Provider  acetaminophen (TYLENOL) 325 MG tablet Take 650 mg by mouth every 6 (six) hours as needed for moderate pain.   Yes [provider]  acyclovir (ZOVIRAX) 800 MG tablet Take 400 mg by mouth daily. Taking 1/2 of 800mg    Yes [provider]  albuterol (PROVENTIL HFA;VENTOLIN HFA) 108 (90 BASE) MCG/ACT inhaler Inhale 2 puffs into the lungs every 4 (four) hours as needed. For shortness of breath   Yes [provider]  aspirin EC 81 MG tablet Take 81 mg by mouth daily.   Yes [provider]  atorvastatin (LIPITOR) 80 MG tablet Take 40 mg by mouth every evening.    Yes [provider]  Cholecalciferol (VITAMIN D-3) 1000 UNITS CAPS Take 1 capsule by mouth 3 (three) times daily.    Yes [provider]  Colloidal Oatmeal (GOLD BOND ECZEMA RELIEF) 2 % CREA Apply 1 application topically as needed (dry skin).   Yes [provider]  DICLOFENAC PO Take 1 tablet by mouth 2 (two) times daily.   Yes [provider]  docusate sodium (COLACE) 100 MG capsule Take 100 mg by mouth 2 (two) times daily.   Yes [provider]  fish oil-omega-3 fatty acids 1000 MG capsule Take 1 g by mouth 2 (two) times daily.  Yes [provider]  GABAPENTIN PO Take 300 mg by mouth 2 (two) times daily.    Yes [provider]  glipiZIDE (GLUCOTROL) 10 MG tablet Take 10 mg by mouth 2 (two) times daily before a meal.   Yes [provider]  insulin glargine (LANTUS) 100 units/mL SOLN Inject 12 Units into the skin daily at 10 pm.    Yes [provider]  lisinopril (PRINIVIL,ZESTRIL) 10 MG tablet Take 5 mg by mouth daily.   Yes [provider]  Menthol-Methyl Salicylate (MUSCLE RUB) 10-15 % CREA Apply 1 application topically as needed for muscle pain (knee and elbow pain).   Yes [provider]  metFORMIN (GLUCOPHAGE) 1000 MG tablet Take 1,000 mg by mouth 2 (two)  times daily with a meal.   Yes [provider]  mometasone (ASMANEX) 220 MCG/INH inhaler Inhale 2 puffs into the lungs at bedtime.    Yes [provider]  Multiple Vitamins-Minerals (MULTIVITAMINS THER. W/MINERALS) TABS Take 1 tablet by mouth daily.   Yes [provider]  sildenafil (VIAGRA) 100 MG tablet Take 100 mg by mouth daily as needed for erectile dysfunction.   Yes [provider]  terazosin (HYTRIN) 5 MG capsule Take 5 mg by mouth at bedtime.   Yes [provider]  dicyclomine (BENTYL) 20 MG tablet Take 1 tablet (20 mg total) by mouth 2 (two) times daily. Patient not taking: Reported on 02/28/2017 04/30/15   Elpidio AnisUpstill, Shari, PA-C  ondansetron (ZOFRAN ODT) 4 MG disintegrating tablet 4mg  ODT q4 hours prn nausea/vomit Patient not taking: Reported on 04/29/2015 12/14/13   Charlynne PanderYao, David Hsienta, MD    Family History Family History  Problem Relation Age of Onset  . Cancer Mother   . Alcohol abuse Father   . CAD Sister   . Diabetes Neg Hx   . Stroke Neg Hx     Social History Social History  Substance Use Topics  . Smoking status: Current Some Day Smoker  . Smokeless tobacco: Never Used  . Alcohol use No     Allergies   Patient has no known allergies.   Review of Systems Review of Systems  Constitutional: Negative for fever.  HENT: Negative for congestion.   Respiratory: Negative for shortness of breath and wheezing.   Cardiovascular: Positive for chest pain.  Gastrointestinal: Positive for nausea. Negative for abdominal pain, constipation, diarrhea and vomiting.  Musculoskeletal: Positive for back pain and neck pain.  Neurological: Negative for headaches.  All other systems reviewed and are negative.    Physical Exam Updated Vital Signs BP (!) 151/97   Pulse 72   Temp 98.5 F (36.9 C) (Oral)   Resp 18   SpO2 100%   Physical Exam  Constitutional: He appears well-developed and well-nourished.  HENT:  Head: Normocephalic.    Eyes: Pupils are equal, round, and reactive to light.  Neck: Normal range of motion.  Cardiovascular: Normal rate.   Occasional extrasystoles are present. PMI is not displaced.   Pulmonary/Chest: Effort normal.  Abdominal: Soft.  Musculoskeletal: Normal range of motion.  Neurological: He is alert.  Skin: Skin is warm.  Psychiatric: He has a normal mood and affect.  Nursing note and vitals reviewed.    ED Treatments / Results  Labs (all labs ordered are listed, but only abnormal results are displayed) Labs Reviewed  BASIC METABOLIC PANEL - Abnormal; Notable for the following:       Result Value   Glucose, Bld 126 (*)  Creatinine, Ser 1.52 (*)    GFR calc non Af Amer 46 (*)    GFR calc Af Amer 53 (*)    All other components within normal limits  CBC - Abnormal; Notable for the following:    RBC 3.83 (*)    Hemoglobin 12.0 (*)    HCT 35.2 (*)    All other components within normal limits  D-DIMER, QUANTITATIVE (NOT AT [facility]) - Abnormal; Notable for the following:    D-Dimer, Quant 0.70 (*)    All other components within normal limits  TROPONIN I  RAPID URINE DRUG SCREEN, HOSP PERFORMED  TROPONIN I  TROPONIN I  TROPONIN I  I-STAT TROPOININ, ED    EKG  EKG Interpretation  Date/Time:  Monday February 28 2017 17:37:38 EDT Ventricular Rate:  103 PR Interval:  144 QRS Duration: 84 QT Interval:  320 QTC Calculation: 419 R Axis:   78 Text Interpretation:  Sinus tachycardia with occasional Premature ventricular complexes Abnormal ECG Confirmed by Rubin Payor  MD, NATHAN [phone]) on 02/28/2017 9:19:50 PM       Radiology Dg Chest 2 View  Result Date: 02/28/2017 CLINICAL DATA:  Palpitations.  Chest pain. EXAM: CHEST  2 VIEW COMPARISON:  05/13/2012 FINDINGS: There is bilateral chronic interstitial prominence. There is no focal parenchymal opacity. There is no pleural effusion or pneumothorax. The heart and mediastinal contours are unremarkable. The osseous structures are  unremarkable. IMPRESSION: No active cardiopulmonary disease. Electronically Signed   By: Elige Ko   On: 02/28/2017 19:09    Procedures Procedures (including critical care time)  Medications Ordered in ED Medications  aspirin chewable tablet 324 mg (324 mg Oral Given 02/28/17 2212)  ondansetron (ZOFRAN) injection 4 mg (4 mg Intravenous Given 02/28/17 2212)  sodium chloride 0.9 % bolus 1,000 mL (1,000 mLs Intravenous New Bag/Given 03/01/17 0005)  iopamidol (ISOVUE-370) 76 % injection (100 mLs  Contrast Given 03/01/17 0055)     Initial Impression / Assessment and Plan / ED Course  I have reviewed the triage vital signs and the nursing notes.  Pertinent labs & imaging results that were available during my care of the patient were reviewed by me and considered in my medical decision making (see chart for details).      Patient revealed, on further questioning that he has a history of blood clots, not currently being treated for same.  He was noted to have slightly elevated creatinine.  He was given 1 L fluid.  Prior to CT angiogram results are pending.  He's had 2 sets of negative cardiac enzymes, which is reassuring.  He will be admitted for further evaluation of his chest pain  Final Clinical Impressions(s) / ED Diagnoses   Final diagnoses:  Chest pain    New Prescriptions New Prescriptions   No medications on file     Earley Favor, NP 03/01/17 0107    Benjiman Core, MD 03/01/17 2243

## 2017-02-28 NOTE — ED Notes (Signed)
Admitting at bedside.  This RN attempted IV in AC x 2 without success.  Patient requesting IV team.

## 2017-02-28 NOTE — H&P (Addendum)
Justin Clayton [id number] DOB: 09/03/1951 DOA: 02/28/2017     [facility]  Patient coming from:    home Lives   With family (girlfriend)   Chief Complaint: chest Pain and palpitations  HPI: Justin Clayton is a 66 y.o. male with medical history significant of DM 2, HTN, HLD    Presented with 1 day history of palpitations and has been getting progressively worse throughout the day associated with chest pain. It  worse when he took a deep breath denies any history of irregular heartbeat. He felt that his heart would start beating hard and fast and had some shortness of breath. Pain was sharp. The pain would last about 30 min.   3 weeks ago he drove 2 h there and 2 h back for a funeral.    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 --      Excl. in GC? --     RR 18 100% Hr 89 BP 143/96 D.dimer 0.7 Trop 0.03  Na 141 K 3.8 Cr 1.52 up from baseline of 0.9 Following Medications were ordered in ER: Medications  aspirin chewable tablet 324 mg (324 mg Oral Given 02/28/17 2212)  ondansetron (ZOFRAN) injection 4 mg (4 mg Intravenous Given 02/28/17 2212)       Hospitalist was called for admission for chest pain evaluation  Review of Systems:    Pertinent positives include: chest pain, palpitations  Constitutional:  No  weight loss, night sweats, Fevers, chills, fatigue, weight loss  HEENT:  No headaches, Difficulty swallowing,Tooth/dental problems,Sore throat,  No sneezing, itching, ear ache, nasal congestion, post nasal drip,  Cardio-vascular:  No Orthopnea, PND, anasarca, dizziness, .no Bilateral lower extremity swelling  GI:  No heartburn, indigestion, abdominal pain, nausea, vomiting, diarrhea, change in bowel habits, loss of appetite, melena, blood in stool, hematemesis Resp:  no shortness of breath at rest. No dyspnea on exertion, No excess mucus, no productive cough, No non-productive cough, No coughing up of blood.No change in color of mucus.No wheezing. Skin:  no rash or lesions. No jaundice GU:  no dysuria, change in color of urine, no urgency or frequency. No straining to urinate.  No flank pain.  Musculoskeletal:  No joint pain or no joint swelling. No decreased range of motion. No back pain.  Psych:  No change in mood or affect. No depression or anxiety. No memory loss.  Neuro: no localizing neurological complaints, no tingling, no weakness, no double vision, no gait abnormality, no slurred speech, no confusion  As per HPI otherwise 10 point review of systems negative.   Past Medical History: Past Medical History:  Diagnosis Date  . Asthma   . Diabetes mellitus   . High cholesterol    Past Surgical History:  Procedure Laterality Date  . FRACTURE SURGERY Right    R leg  . ROTATOR CUFF REPAIR Bilateral      Social History:  Ambulatory   Independently     reports that he has quit smoking. He has never used smokeless tobacco. He reports that he does not drink alcohol or use drugs.  Allergies:  No Known Allergies     Family History:   Family History  Problem Relation Age of Onset  . Cancer Mother   . Alcohol abuse Father   . CAD Sister   . Diabetes Neg Hx   . Stroke Neg Hx     Medications: Prior to Admission medications   Medication Sig Start Date End Date  Taking? Authorizing Provider  acetaminophen (TYLENOL) 325 MG tablet Take 650 mg by mouth every 6 (six) hours as needed for moderate pain.   Yes [provider]  acyclovir (ZOVIRAX) 800 MG tablet Take 400 mg by mouth daily. Taking 1/2 of 800mg    Yes [provider]  albuterol (PROVENTIL HFA;VENTOLIN HFA) 108 (90 BASE) MCG/ACT inhaler Inhale 2 puffs into the lungs every 4 (four) hours as needed. For shortness of breath   Yes [provider]  aspirin EC 81 MG tablet Take 81 mg by mouth daily.   Yes [provider]  atorvastatin (LIPITOR) 80 MG tablet Take 40 mg by mouth every evening.    Yes [provider]  Cholecalciferol (VITAMIN D-3) 1000 UNITS CAPS Take 1 capsule by mouth 3 (three) times daily.    Yes [provider]  Colloidal Oatmeal (GOLD BOND ECZEMA RELIEF) 2 % CREA Apply 1 application topically as needed (dry skin).   Yes [provider]  DICLOFENAC PO Take 1 tablet by mouth 2 (two) times daily.   Yes [provider]  docusate sodium (COLACE) 100 MG capsule Take 100 mg by mouth 2 (two) times daily.   Yes [provider]  fish oil-omega-3 fatty acids 1000 MG capsule Take 1 g by mouth 2 (two) times daily.    Yes [provider]  GABAPENTIN PO Take 2 capsules by mouth every evening.   Yes [provider]  glipiZIDE (GLUCOTROL) 10 MG tablet Take 10 mg by mouth 2 (two) times daily before a meal.   Yes [provider]  insulin glargine (LANTUS) 100 units/mL SOLN Inject 12 Units into the skin daily at 10 pm.    Yes [provider]  lisinopril (PRINIVIL,ZESTRIL) 10 MG tablet Take 5 mg by mouth daily.   Yes [provider]  Menthol-Methyl Salicylate (MUSCLE RUB) 10-15 % CREA Apply 1 application topically as needed for muscle pain (knee and elbow pain).   Yes [provider]  metFORMIN (GLUCOPHAGE) 1000 MG tablet Take 1,000 mg by mouth 2 (two) times daily with a meal.    Yes [provider]  mometasone (ASMANEX) 220 MCG/INH inhaler Inhale 2 puffs into the lungs at bedtime.    Yes [provider]  Multiple Vitamins-Minerals (MULTIVITAMINS THER. W/MINERALS) TABS Take 1 tablet by mouth daily.   Yes [provider]  sildenafil (VIAGRA) 100 MG tablet Take 100 mg by mouth daily as needed for erectile dysfunction.   Yes [provider]  terazosin (HYTRIN) 5 MG  capsule Take 5 mg by mouth at bedtime.   Yes [provider]  dicyclomine (BENTYL) 20 MG tablet Take 1 tablet (20 mg total) by mouth 2 (two) times daily. Patient not taking: Reported on 02/28/2017 04/30/15   Elpidio Anis, PA-C  ondansetron (ZOFRAN ODT) 4 MG disintegrating tablet 4mg  ODT q4 hours prn nausea/vomit Patient not taking: Reported on 04/29/2015 12/14/13   Charlynne Pander, MD    Physical Exam: Patient Vitals for the past 24 hrs:  BP Temp Temp src Pulse Resp SpO2  02/28/17 2020 (!) 143/96 98.5 F (36.9 C) Oral 89 18 100 %  02/28/17 1737 (!) 154/109 98.1 F (36.7 C) Oral (!) 106 16 100 %    1. General:  in No Acute distress 2. Psychological: Alert and   Oriented 3. Head/ENT:    Dry Mucous Membranes                          Head Non traumatic, neck supple                          Normal Dentition 4. SKIN: normal Skin turgor,  Skin clean Dry and intact no rash 5. Heart: Regular rate and rhythm no  Murmur, Rub or gallop 6. Lungs: Clear to auscultation bilaterally, no wheezes or crackles   7. Abdomen: Soft, non-tender, Non distended 8. Lower extremities: no clubbing, cyanosis, or edema, left leg larger then right chronic  Ever since a hx of DVT.  9. Neurologically Grossly intact, moving all 4 extremities equally  10. MSK: Normal range of motion   body mass index is unknown because there is no height or weight on file.  Labs on Admission:   Labs on Admission: I have personally reviewed following labs and imaging studies  CBC:  Recent Labs Lab  02/28/17 1739  WBC 7.1  HGB 12.0*  HCT 35.2*  MCV 91.9  PLT 174   Basic Metabolic Panel:  Recent Labs Lab 02/28/17 1739  NA 141  K 3.8  CL 105  CO2 24  GLUCOSE 126*  BUN 14  CREATININE 1.52*  CALCIUM 8.9   GFR: CrCl cannot be calculated (Unknown ideal weight.). Liver Function Tests: No results for input(s): AST, ALT, ALKPHOS, BILITOT, PROT, ALBUMIN in the last 168 hours. No results for input(s): LIPASE, AMYLASE in the last 168 hours. No results for input(s): AMMONIA in the last 168 hours. Coagulation Profile: No results for input(s): INR, PROTIME in the last 168 hours. Cardiac Enzymes:  Recent Labs Lab 02/28/17 2153  TROPONINI <0.03   BNP (last 3 results) No results for input(s): PROBNP in the last 8760 hours. HbA1C: No results for input(s): HGBA1C in the last 72 hours. CBG: No results for input(s): GLUCAP in the last 168 hours. Lipid Profile: No results for input(s): CHOL, HDL, LDLCALC, TRIG, CHOLHDL, LDLDIRECT in the last 72 hours. Thyroid Function Tests: No results for input(s): TSH, T4TOTAL, FREET4, T3FREE, THYROIDAB in the last 72 hours. Anemia Panel: No results for input(s): VITAMINB12, FOLATE, FERRITIN, TIBC, IRON, RETICCTPCT in the last 72 hours. Urine analysis:  Sepsis Labs: @LABRCNTIP (procalcitonin:4,lacticidven:4) )No results found for this or any previous visit (from the past 240 hour(s)).    UA  not ordered  No results found for: HGBA1C  CrCl cannot be calculated (Unknown ideal weight.).  BNP (last 3 results) No results for input(s): PROBNP in the last 8760 hours.   ECG REPORT  Independently  reviewed Rate: 103  Rhythm: sinus tachycardia with PVC ST&T Change: No acute ischemic changes   QTC 419  There were no vitals filed for this visit.   Cultures: No results found for: SDES, SPECREQUEST, CULT, REPTSTATUS   Radiological Exams on Admission: Dg Chest 2 View  Result Date: 02/28/2017 CLINICAL DATA:  Palpitations.  Chest pain.  EXAM: CHEST  2 VIEW COMPARISON:  05/13/2012 FINDINGS: There is bilateral chronic interstitial prominence. There is no focal parenchymal opacity. There is no pleural effusion or pneumothorax. The heart and mediastinal contours are unremarkable. The osseous structures are unremarkable. IMPRESSION: No active cardiopulmonary disease. Electronically Signed   By: Elige Ko   On: 02/28/2017 19:09    Chart has been reviewed    Assessment/Plan  66 y.o. male with medical history significant of DM 2, HTN, HLD  Admitted for chest pain evaluation  Present on Admission: . Chest pain given history of DVT in the past and pleuritic chest pain obtain CT chest continue to cycle cardiac enzymes. Monitor on telemetry obtain echogram. If CT chest negative for PE may benefit from  stress testing . HLD (hyperlipidemia) stable continue home medications check lipid panel in the morning . Essential hypertension - hold blood pressure medications until CT of the chest have been obtained . Asthma stable continue home medications History of Herpes - continue acyclovir DM 2 - continue home insuline, hold PO meds order SSI AKI - rehydrate, monitor Cr, obtain urine elctrolytes, if no improvement will check Renal US Other plan as per orders.  DVT prophylaxis:   Lovenox     Code Status:  FULL CODE  as per patient    Family Communication:   Family not at  Bedside  Disposition Plan:      To home once workup is complete and patient is stable                                                 Consults called: email cardiology  Admission status:     obs   Level of care     tele           I have spent a total of 57 min on this admission   Emmani Lesueur 03/01/2017, 1:39 AM    [facility]  Pager [phone]   after 2 AM please page floor coverage PA If 7AM-7PM, please contact the day team taking care of the patient  [email or web address]  Password [email or web address]

## 2017-03-01 ENCOUNTER — Observation Stay (HOSPITAL_COMMUNITY): Payer: Medicare Other

## 2017-03-01 ENCOUNTER — Observation Stay (HOSPITAL_BASED_OUTPATIENT_CLINIC_OR_DEPARTMENT_OTHER): Payer: Medicare Other

## 2017-03-01 DIAGNOSIS — Z794 Long term (current) use of insulin: Secondary | ICD-10-CM | POA: Diagnosis not present

## 2017-03-01 DIAGNOSIS — I350 Nonrheumatic aortic (valve) stenosis: Secondary | ICD-10-CM

## 2017-03-01 DIAGNOSIS — I5031 Acute diastolic (congestive) heart failure: Secondary | ICD-10-CM | POA: Diagnosis not present

## 2017-03-01 DIAGNOSIS — R072 Precordial pain: Secondary | ICD-10-CM

## 2017-03-01 DIAGNOSIS — Z7982 Long term (current) use of aspirin: Secondary | ICD-10-CM | POA: Diagnosis not present

## 2017-03-01 DIAGNOSIS — F1910 Other psychoactive substance abuse, uncomplicated: Secondary | ICD-10-CM | POA: Diagnosis not present

## 2017-03-01 DIAGNOSIS — I2510 Atherosclerotic heart disease of native coronary artery without angina pectoris: Secondary | ICD-10-CM | POA: Diagnosis not present

## 2017-03-01 DIAGNOSIS — I10 Essential (primary) hypertension: Secondary | ICD-10-CM | POA: Diagnosis not present

## 2017-03-01 DIAGNOSIS — Z86718 Personal history of other venous thrombosis and embolism: Secondary | ICD-10-CM | POA: Diagnosis not present

## 2017-03-01 DIAGNOSIS — N179 Acute kidney failure, unspecified: Secondary | ICD-10-CM | POA: Diagnosis not present

## 2017-03-01 DIAGNOSIS — B009 Herpesviral infection, unspecified: Secondary | ICD-10-CM | POA: Diagnosis not present

## 2017-03-01 DIAGNOSIS — J9811 Atelectasis: Secondary | ICD-10-CM | POA: Diagnosis not present

## 2017-03-01 DIAGNOSIS — J45909 Unspecified asthma, uncomplicated: Secondary | ICD-10-CM | POA: Diagnosis not present

## 2017-03-01 DIAGNOSIS — R079 Chest pain, unspecified: Secondary | ICD-10-CM | POA: Diagnosis not present

## 2017-03-01 DIAGNOSIS — E7800 Pure hypercholesterolemia, unspecified: Secondary | ICD-10-CM | POA: Diagnosis not present

## 2017-03-01 DIAGNOSIS — R0789 Other chest pain: Secondary | ICD-10-CM | POA: Diagnosis not present

## 2017-03-01 DIAGNOSIS — E785 Hyperlipidemia, unspecified: Secondary | ICD-10-CM | POA: Diagnosis not present

## 2017-03-01 DIAGNOSIS — E119 Type 2 diabetes mellitus without complications: Secondary | ICD-10-CM | POA: Diagnosis not present

## 2017-03-01 LAB — GLUCOSE, CAPILLARY
GLUCOSE-CAPILLARY: 195 mg/dL — AB (ref 65–99)
GLUCOSE-CAPILLARY: 175 mg/dL — AB (ref 65–99)
GLUCOSE-CAPILLARY: 125 mg/dL — AB (ref 65–99)
GLUCOSE-CAPILLARY: 130 mg/dL — AB (ref 65–99)
Glucose-Capillary: 114 mg/dL — ABNORMAL HIGH (ref 65–99)

## 2017-03-01 LAB — LIPID PANEL
Cholesterol: 139 mg/dL (ref 0–200)
HDL: 31 mg/dL — AB (ref >40)
LDL Cholesterol: 95 mg/dL (ref 0–99)
TRIGLYCERIDES: 63 mg/dL (ref <150)
Total CHOL/HDL Ratio: 4.5 RATIO
VLDL: 13 mg/dL (ref 0–40)

## 2017-03-01 LAB — COMPREHENSIVE METABOLIC PANEL
ALT: 16 U/L — ABNORMAL LOW (ref 17–63)
ANION GAP: 9 (ref 5–15)
AST: 17 U/L (ref 15–41)
Albumin: 3.6 g/dL (ref 3.5–5.0)
Alkaline Phosphatase: 33 U/L — ABNORMAL LOW (ref 38–126)
BUN: 11 mg/dL (ref 6–20)
CHLORIDE: 105 mmol/L (ref 101–111)
CO2: 24 mmol/L (ref 22–32)
Calcium: 8.6 mg/dL — ABNORMAL LOW (ref 8.9–10.3)
Creatinine, Ser: 1.28 mg/dL — ABNORMAL HIGH (ref 0.61–1.24)
GFR calc non Af Amer: 57 mL/min — ABNORMAL LOW (ref >60)
Glucose, Bld: 134 mg/dL — ABNORMAL HIGH (ref 65–99)
POTASSIUM: 4 mmol/L (ref 3.5–5.1)
SODIUM: 138 mmol/L (ref 135–145)
Total Bilirubin: 0.5 mg/dL (ref 0.3–1.2)
Total Protein: 6.9 g/dL (ref 6.5–8.1)

## 2017-03-01 LAB — PHOSPHORUS: Phosphorus: 3.2 mg/dL (ref 2.5–4.6)

## 2017-03-01 LAB — MAGNESIUM: MAGNESIUM: 1.5 mg/dL — AB (ref 1.7–2.4)

## 2017-03-01 LAB — CBC
HCT: 35.7 % — ABNORMAL LOW (ref 39.0–52.0)
HEMOGLOBIN: 11.9 g/dL — AB (ref 13.0–17.0)
MCH: 30.9 pg (ref 26.0–34.0)
MCHC: 33.3 g/dL (ref 30.0–36.0)
MCV: 92.7 fL (ref 78.0–100.0)
Platelets: 92 10*3/uL — ABNORMAL LOW (ref 150–400)
RBC: 3.85 MIL/uL — AB (ref 4.22–5.81)
RDW: 12.9 % (ref 11.5–15.5)
WBC: 5.9 10*3/uL (ref 4.0–10.5)

## 2017-03-01 LAB — RAPID URINE DRUG SCREEN, HOSP PERFORMED
Amphetamines: NOT DETECTED
Barbiturates: NOT DETECTED
Benzodiazepines: NOT DETECTED
Cocaine: POSITIVE — AB
Opiates: NOT DETECTED
Tetrahydrocannabinol: NOT DETECTED

## 2017-03-01 LAB — SODIUM, URINE, RANDOM: Sodium, Ur: 29 mmol/L

## 2017-03-01 LAB — TROPONIN I: Troponin I: <0.03 ng/mL (ref <0.03)

## 2017-03-01 LAB — ECHOCARDIOGRAM COMPLETE
Height: 74 in
Weight: 3587.2 oz

## 2017-03-01 LAB — CREATININE, URINE, RANDOM: CREATININE, URINE: 43.71 mg/dL

## 2017-03-01 LAB — TSH: TSH: 0.747 u[IU]/mL (ref 0.350–4.500)

## 2017-03-01 MED ORDER — ACETAMINOPHEN 650 MG RE SUPP: 650.0000 mg | Freq: Four times a day (QID) | RECTAL | Status: DC | PRN

## 2017-03-01 MED ORDER — ALBUTEROL SULFATE HFA 108 (90 BASE) MCG/ACT IN AERS: 2.0000 | INHALATION_SPRAY | RESPIRATORY_TRACT | Status: DC | PRN

## 2017-03-01 MED ORDER — GABAPENTIN 300 MG PO CAPS
300.0000 mg | ORAL_CAPSULE | Freq: Two times a day (BID) | ORAL | Status: DC
  Administered 2017-03-01 – 2017-03-02 (×4): 300 mg via ORAL
  Filled 2017-03-01 (×4): qty 1

## 2017-03-01 MED ORDER — ASPIRIN EC 81 MG PO TBEC
81.0000 mg | DELAYED_RELEASE_TABLET | Freq: Every day | ORAL | Status: DC
Start: 2017-03-01 — End: 2017-03-02
  Administered 2017-03-01 – 2017-03-02 (×2): 81 mg via ORAL
  Filled 2017-03-01 (×2): qty 1

## 2017-03-01 MED ORDER — ENOXAPARIN SODIUM 30 MG/0.3ML ~~LOC~~ SOLN
30.0000 mg | SUBCUTANEOUS | Status: DC
  Administered 2017-03-01: 30 mg via SUBCUTANEOUS
  Filled 2017-03-01: qty 0.3

## 2017-03-01 MED ORDER — ALBUTEROL SULFATE (2.5 MG/3ML) 0.083% IN NEBU: 2.5000 mg | INHALATION_SOLUTION | RESPIRATORY_TRACT | Status: DC | PRN

## 2017-03-01 MED ORDER — GUAIFENESIN ER 600 MG PO TB12
600.0000 mg | ORAL_TABLET | Freq: Two times a day (BID) | ORAL | Status: DC
  Administered 2017-03-01 – 2017-03-02 (×4): 600 mg via ORAL
  Filled 2017-03-01 (×4): qty 1

## 2017-03-01 MED ORDER — SODIUM CHLORIDE 0.9% FLUSH
3.0000 mL | Freq: Two times a day (BID) | INTRAVENOUS | Status: DC
  Administered 2017-03-01 – 2017-03-02 (×3): 3 mL via INTRAVENOUS

## 2017-03-01 MED ORDER — ACETAMINOPHEN 325 MG PO TABS: 650.0000 mg | ORAL_TABLET | Freq: Four times a day (QID) | ORAL | Status: DC | PRN

## 2017-03-01 MED ORDER — ONDANSETRON HCL 4 MG PO TABS: 4.0000 mg | ORAL_TABLET | Freq: Four times a day (QID) | ORAL | Status: DC | PRN

## 2017-03-01 MED ORDER — ONDANSETRON HCL 4 MG/2ML IJ SOLN: 4.0000 mg | Freq: Four times a day (QID) | INTRAMUSCULAR | Status: DC | PRN

## 2017-03-01 MED ORDER — ACYCLOVIR 400 MG PO TABS
400.0000 mg | ORAL_TABLET | Freq: Every day | ORAL | Status: DC
  Administered 2017-03-01 – 2017-03-02 (×2): 400 mg via ORAL
  Filled 2017-03-01 (×2): qty 1

## 2017-03-01 MED ORDER — ENOXAPARIN SODIUM 60 MG/0.6ML ~~LOC~~ SOLN
0.5000 mg/kg | SUBCUTANEOUS | Status: DC
  Administered 2017-03-02: 50 mg via SUBCUTANEOUS
  Filled 2017-03-01: qty 0.6

## 2017-03-01 MED ORDER — ATORVASTATIN CALCIUM 40 MG PO TABS
40.0000 mg | ORAL_TABLET | Freq: Every evening | ORAL | Status: DC
  Administered 2017-03-01: 40 mg via ORAL
  Filled 2017-03-01: qty 1

## 2017-03-01 MED ORDER — SODIUM CHLORIDE 0.9 % IV SOLN
INTRAVENOUS | Status: AC
  Administered 2017-03-01: 02:00:00 via INTRAVENOUS

## 2017-03-01 MED ORDER — MAGNESIUM SULFATE 4 GM/100ML IV SOLN
4.0000 g | Freq: Once | INTRAVENOUS | Status: AC
  Administered 2017-03-01: 4 g via INTRAVENOUS
  Filled 2017-03-01: qty 100

## 2017-03-01 MED ORDER — INSULIN GLARGINE 100 UNIT/ML ~~LOC~~ SOLN
12.0000 [IU] | Freq: Every day | SUBCUTANEOUS | Status: DC
  Administered 2017-03-01: 12 [IU] via SUBCUTANEOUS
  Filled 2017-03-01 (×2): qty 0.12

## 2017-03-01 MED ORDER — INSULIN ASPART 100 UNIT/ML ~~LOC~~ SOLN
0.0000 [IU] | Freq: Three times a day (TID) | SUBCUTANEOUS | Status: DC
  Administered 2017-03-01: 1 [IU] via SUBCUTANEOUS
  Administered 2017-03-01: 2 [IU] via SUBCUTANEOUS
  Administered 2017-03-02: 3 [IU] via SUBCUTANEOUS
  Administered 2017-03-02: 2 [IU] via SUBCUTANEOUS

## 2017-03-01 MED ORDER — INSULIN ASPART 100 UNIT/ML ~~LOC~~ SOLN: 0.0000 [IU] | Freq: Every day | SUBCUTANEOUS | Status: DC

## 2017-03-01 MED ORDER — IOPAMIDOL (ISOVUE-370) INJECTION 76%
INTRAVENOUS | Status: AC
  Administered 2017-03-01: 100 mL
  Filled 2017-03-01: qty 100

## 2017-03-01 NOTE — Care Management Obs Status (Signed)
MEDICARE OBSERVATION STATUS NOTIFICATION   Patient Details  Name: Justin Clayton MRN: [id number] Date of Birth: 1951/03/26   Medicare Observation Status Notification Given:  Yes    Darrold SpanWebster, Rodolfo Notaro Hall, RN 03/01/2017, 5:07 PM

## 2017-03-01 NOTE — Consult Note (Signed)
Cardiology Consult    Patient ID: Justin Clayton MRN: [id number], DOB/AGE: [birth year]   Admit date: 02/28/2017 Date of Consult: 03/01/2017  Primary Physician: [facility] Primary Cardiologist: new - Dr. Antoine Poche Requesting Provider: Dr. Catha Gosselin  Reason for Consult: chest pain  Patient Profile    Justin Clayton has a PMH significant for DM, HTN, HLD, and drug abuse (cocaine). He has chronic back pain now on gabapentin and requests no narcotics.  He presented to [facility] with chest pain and palpitations. UDS was positive for cocaine.   Justin Clayton is a 66 y.o. male who is being seen today for the evaluation of chest pain at the request of Dr. Catha Gosselin.   Past Medical History   Past Medical History:  Diagnosis Date  . Asthma   . Diabetes mellitus   . High cholesterol     Past Surgical History:  Procedure Laterality Date  . FRACTURE SURGERY Right    R leg  . ROTATOR CUFF REPAIR Bilateral      Allergies  No Known Allergies  History of Present Illness    Justin Clayton does not currently follow with cardiology. He presented to [facility] with complaints of chest pain and palpitations. He does not have a history of arrhythmias, but states he has a history of blood clots and had an elevated D-dimer. CTA negative for PE.   On my interview, he states chest pain started on Sunday and felt like he was punched in the chest , rated as a 9/10 at its worst and waxed and waned. The pain spontaneously resolved. He does not know how long the pain lasted. There are no relieving or aggravating factors.  He continued to have bouts of chest pain that felt sharp like a "punching" sensation, so he reported to [facility]. He continues to have very brief bouts of chest pain. He reports sensation of palpitations daily, denies dizziness, but reports being nauseous. He was also diaphoretic and short of breath with the chest pain. He last used cocaine on Friday 02/25/17. He denies chest pain and  palpitations associated with cocaine use. He uses cocaine less than monthly. He smokes 5 cigarettes per week.  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 He was treated with coumadin for at least 6 months.  He follows outpatient with the [place]. He is compliant on his home medications, including cardiac meds and insulin.   Inpatient Medications    . acyclovir  400 mg Oral Daily  . aspirin EC  81 mg Oral Daily  . atorvastatin  40 mg Oral QPM  . enoxaparin (LOVENOX) injection  30 mg Subcutaneous Q24H  . gabapentin  300 mg Oral BID  . guaiFENesin  600 mg Oral BID  . insulin aspart  0-5 Units Subcutaneous QHS  . insulin aspart  0-9 Units Subcutaneous TID WC  . insulin glargine  12 Units Subcutaneous Q2200  . sodium chloride flush  3 mL Intravenous Q12H     Outpatient Medications    Prior to Admission medications   Medication Sig Start Date End Date Taking? Authorizing Provider  acetaminophen (TYLENOL) 325 MG tablet Take 650 mg by mouth every 6 (six) hours as needed for moderate pain.   Yes [provider]  acyclovir (ZOVIRAX) 800 MG tablet Take 400 mg by mouth daily. Taking 1/2 of 800mg    Yes [provider]  albuterol (PROVENTIL HFA;VENTOLIN HFA) 108 (90 BASE) MCG/ACT inhaler Inhale 2 puffs into the lungs every 4 (four) hours as needed. For shortness  of breath   Yes [provider]  aspirin EC 81 MG tablet Take 81 mg by mouth daily.   Yes [provider]  atorvastatin (LIPITOR) 80 MG tablet Take 40 mg by mouth every evening.    Yes [provider]  Cholecalciferol (VITAMIN D-3) 1000 UNITS CAPS Take 1 capsule by mouth 3 (three) times daily.    Yes [provider]  Colloidal Oatmeal (GOLD BOND ECZEMA RELIEF) 2 % CREA Apply 1 application topically as needed (dry skin).   Yes [provider]  DICLOFENAC PO Take 1 tablet by mouth 2 (two) times daily.   Yes [provider]  docusate sodium (COLACE) 100 MG capsule Take 100 mg by mouth 2  (two) times daily.   Yes [provider]  fish oil-omega-3 fatty acids 1000 MG capsule Take 1 g by mouth 2 (two) times daily.    Yes [provider]  GABAPENTIN PO Take 300 mg by mouth 2 (two) times daily.    Yes [provider]  glipiZIDE (GLUCOTROL) 10 MG tablet Take 10 mg by mouth 2 (two) times daily before a meal.   Yes [provider]  insulin glargine (LANTUS) 100 units/mL SOLN Inject 12 Units into the skin daily at 10 pm.    Yes [provider]  lisinopril (PRINIVIL,ZESTRIL) 10 MG tablet Take 5 mg by mouth daily.   Yes [provider]  Menthol-Methyl Salicylate (MUSCLE RUB) 10-15 % CREA Apply 1 application topically as needed for muscle pain (knee and elbow pain).   Yes [provider]  metFORMIN (GLUCOPHAGE) 1000 MG tablet Take 1,000 mg by mouth 2 (two) times daily with a meal.   Yes [provider]  mometasone (ASMANEX) 220 MCG/INH inhaler Inhale 2 puffs into the lungs at bedtime.    Yes [provider]  Multiple Vitamins-Minerals (MULTIVITAMINS THER. W/MINERALS) TABS Take 1 tablet by mouth daily.   Yes [provider]  sildenafil (VIAGRA) 100 MG tablet Take 100 mg by mouth daily as needed for erectile dysfunction.   Yes [provider]  terazosin (HYTRIN) 5 MG capsule Take 5 mg by mouth at bedtime.   Yes [provider]  dicyclomine (BENTYL) 20 MG tablet Take 1 tablet (20 mg total) by mouth 2 (two) times daily. Patient not taking: Reported on 02/28/2017 04/30/15   Elpidio AnisUpstill, Shari, PA-C  ondansetron (ZOFRAN ODT) 4 MG disintegrating tablet 4mg  ODT q4 hours prn nausea/vomit Patient not taking: Reported on 04/29/2015 12/14/13   Charlynne PanderYao, David Hsienta, MD     Family History     Family History  Problem Relation Age of Onset  . Cancer Mother   . Alcohol abuse Father   . CAD Sister   . Diabetes Neg Hx   . Stroke Neg Hx     Social History    Social History   Social History  . Marital  status: Single    Spouse name: N/A  . Number of children: N/A  . Years of education: N/A   Occupational History  . Not on file.   Social History Main Topics  . Smoking status: Current Some Day Smoker  . Smokeless tobacco: Never Used  . Alcohol use No  . Drug use: No  . Sexual activity: Not on file   Other Topics Concern  . Not on file   Social History Narrative  . No narrative on file     Review of Systems    General:  No chills, fever, night  sweats or weight changes.  Cardiovascular:  + chest pain, dyspnea on exertion, edema, orthopnea, palpitations, paroxysmal nocturnal dyspnea. Dermatological: No rash, lesions/masses Respiratory: No cough, + dyspnea Urologic: No hematuria, dysuria Abdominal:   No nausea, vomiting, diarrhea, bright red blood per rectum, melena, or hematemesis Neurologic:  No visual changes, changes in mental status. All other systems reviewed and are otherwise negative except as noted above.  Physical Exam    Blood pressure (!) 150/77, pulse 74, temperature 98 F (36.7 C), temperature source Oral, resp. rate 18, height 6\' 2"  (1.88 m), weight 224 lb 3.2 oz (101.7 kg), SpO2 100 %.  General: Pleasant, NAD Psych: Normal affect. Neuro: Alert and oriented X 3. Moves all extremities spontaneously. HEENT: Normal  Neck: Supple without bruits or JVD. Lungs:  Resp regular and unlabored, CTA. Heart: RRR no s3, s4, or murmurs. Abdomen: Soft, non-tender, non-distended, BS + x 4.  Extremities: No clubbing, cyanosis or edema. DP/PT/Radials 2+ and equal bilaterally.  Labs    Troponin [facility] Test)  Recent Labs  02/28/17 1746  TROPIPOC 0.00    Recent Labs  02/28/17 2153 03/01/17 0134 03/01/17 0727 03/01/17 1227  TROPONINI <0.03 <0.03 <0.03 <0.03   Lab Results  Component Value Date   WBC 5.9 03/01/2017   HGB 11.9 (L) 03/01/2017   HCT 35.7 (L) 03/01/2017   MCV 92.7 03/01/2017   PLT 92 (L) 03/01/2017    Recent Labs Lab 03/01/17 0727  NA  138  K 4.0  CL 105  CO2 24  BUN 11  CREATININE 1.28*  CALCIUM 8.6*  PROT 6.9  BILITOT 0.5  ALKPHOS 33*  ALT 16*  AST 17  GLUCOSE 134*   Lab Results  Component Value Date   CHOL 139 03/01/2017   HDL 31 (L) 03/01/2017   LDLCALC 95 03/01/2017   TRIG 63 03/01/2017   Lab Results  Component Value Date   DDIMER 0.70 (H) 02/28/2017     Radiology Studies    Dg Chest 2 View  Result Date: 02/28/2017 CLINICAL DATA:  Palpitations.  Chest pain. EXAM: CHEST  2 VIEW COMPARISON:  05/13/2012 FINDINGS: There is bilateral chronic interstitial prominence. There is no focal parenchymal opacity. There is no pleural effusion or pneumothorax. The heart and mediastinal contours are unremarkable. The osseous structures are unremarkable. IMPRESSION: No active cardiopulmonary disease. Electronically Signed   By: Elige Ko   On: 02/28/2017 19:09   Ct Angio Chest Pe W Or Wo Contrast  Result Date: 03/01/2017 CLINICAL DATA:  Chest pain.  History of diabetes and asthma. EXAM: CT ANGIOGRAPHY CHEST WITH CONTRAST TECHNIQUE: Multidetector CT imaging of the chest was performed using the standard protocol during bolus administration of intravenous contrast. Multiplanar CT image reconstructions and MIPs were obtained to evaluate the vascular anatomy. CONTRAST:  73 cc Isovue 370 IV COMPARISON:  None. FINDINGS: Cardiovascular: Cardiomegaly without pericardial effusion. Coronary arteriosclerosis along the circumflex and RCA. No aortic aneurysm. Aortic atherosclerosis at the arch. No acute pulmonary embolus. Mediastinum/Nodes: Mild thyromegaly without discrete thyroid mass. Trachea and mainstem bronchi are patent with slight deviation of the trachea convex to the right. Esophagus is unremarkable. Lungs/Pleura: Subpleural blebs in the periphery of the upper lobes. No pneumonic consolidation, dominant mass, effusion or pneumothorax. Upper Abdomen: No acute abnormality. Musculoskeletal: No chest wall abnormality. No acute or  significant osseous findings. Bilateral gynecomastia. Review of the MIP images confirms the above findings. IMPRESSION: 1. Coronary arteriosclerosis. Aortic atherosclerosis without aneurysm or dissection. 2. No acute pulmonary embolus. 3.  Bibasilar dependent atelectasis. No acute pulmonary disease. Subpleural blebs bilaterally. Electronically Signed   By: Tollie Eth M.D.   On: 03/01/2017 01:45    ECG & Cardiac Imaging    EKG 03/01/17: sinus rhythm, nonspecific T wave changes  Echocardiogram 03/01/17: Study Conclusions - Left ventricle: The cavity size was normal. There was mild   concentric hypertrophy. Systolic function was vigorous. The   estimated ejection fraction was in the range of 65% to 70%. Wall   motion was normal; there were no regional wall motion   abnormalities. Doppler parameters are consistent with abnormal   left ventricular relaxation (grade 1 diastolic dysfunction). - Aortic valve: Trileaflet; mildly thickened, mildly calcified   leaflets. There was mild regurgitation. - Tricuspid valve: There was trivial regurgitation.   Assessment & Plan    1. Chest pain - troponin x 3 negative - EKG sinus rhythm, nonspecific T wave changes - elevated D dimer 0.70; CTA negative for PE - recommend lower extremity duplex to rule out DVT - home medications include ASA, lipitor, lisinopril  - recommend holding ACEI in setting of AKI He has typical and atypical features associated with this chest pain. It is unlikely that this was precipitated by his cocaine use 2 days prior. He has risk factors for CAD, including HTN, HLD, DM, and mild tobacco use. Echo today without wall motion abnormality. We will likely proceed with ischemic evaluation in the form of a stress myoview vs cardiac catheterization. NPO at midnight.    2. Hx of drug abuse  - positive UDS for cocaine   3. Acute diastolic heart failure - BNP not collected on admission   4. Low magnesium - replace via IV Mg 4  g   5. Acute kidney injury - sCr 1.28 (1.52) - hold metformin and ACEI   6. DM - recommend holding metformin in the setting of AKI - per primary team   7. Hx of DVT (1990s), elevated D-dimer - ordered lower extremity duplex   Signed, Marcelino Duster, PA-C 03/01/2017, 4:11 PM [phone]   History and all data above reviewed.  Patient examined.  I agree with the findings as above.  Patient with chest pain as above.  He has throbbing sporadic chest pain that came off and on yesterday.  He otherwise has had no history of CAD or chest pain.  He does have risk factors. He has had no objective evidence of ischemia  The patient exam reveals COR:RRR  ,  Lungs: Clear  ,  Abd: Positive bowel sounds, no rebound no guarding, Ext No edema  .  All available labs, radiology testing, previous records reviewed. Agree with documented assessment and plan.   Chest pain:  Atypical and typical features with a moderate pretest probability of obstructive CAD as an etiology.  Plan exercise Myoview for the AM.     Rollene Rotunda  5:24 PM  03/01/2017

## 2017-03-01 NOTE — Progress Notes (Signed)
Patient arrived to unit from ED. A&OX4, ambulatory to room without deficit. VSS at this time. Patient placed on cardiac monitor and placed in a position of stated comfort. No needs voiced, will continue to monitor. Plan of care discussed with hospitalist

## 2017-03-01 NOTE — Progress Notes (Signed)
PROGRESS NOTE    Justin Clayton  QMV:[id number] DOB: 1950-11-17 DOA: 02/28/2017 PCP: [facility]   Chief Complaint  Patient presents with  . Chest Pain  . Palpitations    Brief Narrative:  66 year old male with history of hypertension, hyperlipidemia, diabetes mellitus, presented with chest pain which has been progressively worsening for one day.  Assessment & Plan   Chest pain -Patient still having left-sided chest pain, with pleuritic-type nature -Troponin Psych: unremarkable -Echocardiogram: EF 65-70%, grade 1 diastolic dysfunction trivial tricuspid valve regurgitation, mild aortic valve regurgitation -Cardiology consultation appreciated -Patient did have elevated d-dimer, CTA chest negative for pulmonary embolism. Did show coronary arteriosclerosis -Continue aspirin, statin -Avoiding beta blockers given patient's recent use of cocaine  Essential hypertension -Lisinopril held due to AKI -BP currently stable, continue to monitor and treat with IV medications if needed  Hyperlipidemia -Lipid panel shows total cholesterol 139, HDL 31, LDL 95, triglycerides 63 -Continue statin   Diabetes mellitus, type II with neuropathy -Metformin and glipizide held -Continue insulin sliding scale, Lantus, CBG monitoring   Acute kidney injury -Creatinine 1.52 upon admission, improving, currently 1.28 -Baseline creatinine 0.9 -Continue to monitor BMP   Polysubstance abuse -Toxicology screen positive for cocaine -Admits to using approximately 1 week ago -Discussed smoking and drug cessation with patient  Herpes -Continue acyclovir  DVT Prophylaxis  Lovenox  Code Status: Full  Family Communication: None at bedside  Disposition Plan: home when stable  Consultants Cardiology  Procedures  Echocardiogram   Antibiotics   Anti-infectives    Start     Dose/Rate Route Frequency Ordered Stop   03/01/17 1000  acyclovir (ZOVIRAX) tablet 400 mg     400 mg Oral Daily  03/01/17 0114        Subjective:   Justin Clayton seen and examined today. Continues to have chest pain, noted on the left side of his chest which goes straight back. Worse with deep inspiration. Denies any current shortness of breath, abdominal pain, nausea or vomiting, diarrhea or constipation.   Objective:   Vitals:   03/01/17 0009 03/01/17 0030 03/01/17 0122 03/01/17 0500  BP:  (!) 151/97 (!) 154/85 133/73  Pulse: 71 72 81 63  Resp: 17 18 18 20   Temp:   97.9 F (36.6 C) 98.6 F (37 C)  TempSrc:   Oral Oral  SpO2: 100% 100% 100% 99%  Weight:   101.7 kg (224 lb 3.2 oz)   Height:   6\' 2"  (1.88 m)     Intake/Output Summary (Last 24 hours) at 03/01/17 1405 Last data filed at 03/01/17 1210  Gross per 24 hour  Intake              240 ml  Output                0 ml  Net              240 ml   Filed Weights   03/01/17 0122  Weight: 101.7 kg (224 lb 3.2 oz)    Exam  General: Well developed, well nourished, NAD, appears stated age  HEENT: NCAT, mucous membranes moist.   Cardiovascular: S1 S2 auscultated, no rubs, murmurs or gallops. Regular rate and rhythm.  Respiratory: Clear to auscultation bilaterally with equal chest rise  Abdomen: Soft, nontender, nondistended, + bowel sounds  Extremities: warm dry without cyanosis clubbing or edema. LLE larger than RLE  Neuro: AAOx3, nonfocal  Psych: Normal affect and demeanor    Data Reviewed: I have personally  reviewed following labs and imaging studies  CBC:  Recent Labs Lab 02/28/17 1739 03/01/17 0727  WBC 7.1 5.9  HGB 12.0* 11.9*  HCT 35.2* 35.7*  MCV 91.9 92.7  PLT 174 92*   Basic Metabolic Panel:  Recent Labs Lab 02/28/17 1739 03/01/17 0727  NA 141 138  K 3.8 4.0  CL 105 105  CO2 24 24  GLUCOSE 126* 134*  BUN 14 11  CREATININE 1.52* 1.28*  CALCIUM 8.9 8.6*  MG  --  1.5*  PHOS  --  3.2   GFR: Estimated Creatinine Clearance: 72.3 mL/min (A) (by C-G formula based on SCr of 1.28 mg/dL  (H)). Liver Function Tests:  Recent Labs Lab 03/01/17 0727  AST 17  ALT 16*  ALKPHOS 33*  BILITOT 0.5  PROT 6.9  ALBUMIN 3.6   No results for input(s): LIPASE, AMYLASE in the last 168 hours. No results for input(s): AMMONIA in the last 168 hours. Coagulation Profile: No results for input(s): INR, PROTIME in the last 168 hours. Cardiac Enzymes:  Recent Labs Lab 02/28/17 2153 03/01/17 0134 03/01/17 0727 03/01/17 1227  TROPONINI <0.03 <0.03 <0.03 <0.03   BNP (last 3 results) No results for input(s): PROBNP in the last 8760 hours. HbA1C: No results for input(s): HGBA1C in the last 72 hours. CBG:  Recent Labs Lab 03/01/17 0120 03/01/17 0640 03/01/17 1145  GLUCAP 195* 114* 175*   Lipid Profile:  Recent Labs  03/01/17 0727  CHOL 139  HDL 31*  LDLCALC 95  TRIG 63  CHOLHDL 4.5   Thyroid Function Tests:  Recent Labs  03/01/17 0134  TSH 0.747   Anemia Panel: No results for input(s): VITAMINB12, FOLATE, FERRITIN, TIBC, IRON, RETICCTPCT in the last 72 hours. Urine analysis:    Component Value Date/Time   COLORURINE AMBER (A) 04/29/2015 2026   APPEARANCEUR CLEAR 04/29/2015 2026   LABSPEC 1.013 04/29/2015 2026   PHURINE 7.0 04/29/2015 2026   GLUCOSEU NEGATIVE 04/29/2015 2026   HGBUR NEGATIVE 04/29/2015 2026   BILIRUBINUR NEGATIVE 04/29/2015 2026   KETONESUR NEGATIVE 04/29/2015 2026   PROTEINUR 30 (A) 04/29/2015 2026   UROBILINOGEN 0.2 04/29/2015 2026   NITRITE NEGATIVE 04/29/2015 2026   LEUKOCYTESUR NEGATIVE 04/29/2015 2026   Sepsis Labs: @LABRCNTIP (procalcitonin:4,lacticidven:4)  )No results found for this or any previous visit (from the past 240 hour(s)).    Radiology Studies: Dg Chest 2 View  Result Date: 02/28/2017 CLINICAL DATA:  Palpitations.  Chest pain. EXAM: CHEST  2 VIEW COMPARISON:  05/13/2012 FINDINGS: There is bilateral chronic interstitial prominence. There is no focal parenchymal opacity. There is no pleural effusion or pneumothorax.  The heart and mediastinal contours are unremarkable. The osseous structures are unremarkable. IMPRESSION: No active cardiopulmonary disease. Electronically Signed   By: Elige KoHetal  Patel   On: 02/28/2017 19:09   Ct Angio Chest Pe W Or Wo Contrast  Result Date: 03/01/2017 CLINICAL DATA:  Chest pain.  History of diabetes and asthma. EXAM: CT ANGIOGRAPHY CHEST WITH CONTRAST TECHNIQUE: Multidetector CT imaging of the chest was performed using the standard protocol during bolus administration of intravenous contrast. Multiplanar CT image reconstructions and MIPs were obtained to evaluate the vascular anatomy. CONTRAST:  73 cc Isovue 370 IV COMPARISON:  None. FINDINGS: Cardiovascular: Cardiomegaly without pericardial effusion. Coronary arteriosclerosis along the circumflex and RCA. No aortic aneurysm. Aortic atherosclerosis at the arch. No acute pulmonary embolus. Mediastinum/Nodes: Mild thyromegaly without discrete thyroid mass. Trachea and mainstem bronchi are patent with slight deviation of the trachea convex to the right. Esophagus  is unremarkable. Lungs/Pleura: Subpleural blebs in the periphery of the upper lobes. No pneumonic consolidation, dominant mass, effusion or pneumothorax. Upper Abdomen: No acute abnormality. Musculoskeletal: No chest wall abnormality. No acute or significant osseous findings. Bilateral gynecomastia. Review of the MIP images confirms the above findings. IMPRESSION: 1. Coronary arteriosclerosis. Aortic atherosclerosis without aneurysm or dissection. 2. No acute pulmonary embolus. 3. Bibasilar dependent atelectasis. No acute pulmonary disease. Subpleural blebs bilaterally. Electronically Signed   By: Tollie Eth M.D.   On: 03/01/2017 01:45     Scheduled Meds: . acyclovir  400 mg Oral Daily  . aspirin EC  81 mg Oral Daily  . atorvastatin  40 mg Oral QPM  . enoxaparin (LOVENOX) injection  30 mg Subcutaneous Q24H  . gabapentin  300 mg Oral BID  . guaiFENesin  600 mg Oral BID  . insulin  aspart  0-5 Units Subcutaneous QHS  . insulin aspart  0-9 Units Subcutaneous TID WC  . insulin glargine  12 Units Subcutaneous Q2200  . sodium chloride flush  3 mL Intravenous Q12H   Continuous Infusions:   LOS: 0 days   Time Spent in minutes   30 minutes  Tonatiuh Mallon D.O. on 03/01/2017 at 2:05 PM  Between 7am to 7pm - Pager - [phone]  After 7pm go to [email or web address] - password [email or web address]  And look for the night coverage person covering for me after hours  [facility] Office  [phone]

## 2017-03-02 ENCOUNTER — Observation Stay (HOSPITAL_BASED_OUTPATIENT_CLINIC_OR_DEPARTMENT_OTHER): Payer: Medicare Other

## 2017-03-02 DIAGNOSIS — R079 Chest pain, unspecified: Secondary | ICD-10-CM | POA: Diagnosis not present

## 2017-03-02 DIAGNOSIS — R7989 Other specified abnormal findings of blood chemistry: Secondary | ICD-10-CM

## 2017-03-02 DIAGNOSIS — I10 Essential (primary) hypertension: Secondary | ICD-10-CM

## 2017-03-02 DIAGNOSIS — R0789 Other chest pain: Secondary | ICD-10-CM | POA: Diagnosis not present

## 2017-03-02 LAB — HEMOGLOBIN A1C
HEMOGLOBIN A1C: 6.5 % — AB (ref 4.8–5.6)
MEAN PLASMA GLUCOSE: 140 mg/dL

## 2017-03-02 LAB — NM MYOCAR MULTI W/SPECT W/WALL MOTION / EF
CHL RATE OF PERCEIVED EXERTION: 12
CSEPED: 7 min
Estimated workload: 9 METS
Exercise duration (sec): 20 s
MPHR: 154 {beats}/min
Peak HR: 148 {beats}/min
Percent HR: 96 %
Rest HR: 100 {beats}/min

## 2017-03-02 LAB — BASIC METABOLIC PANEL
ANION GAP: 7 (ref 5–15)
BUN: 11 mg/dL (ref 6–20)
CO2: 28 mmol/L (ref 22–32)
Calcium: 9 mg/dL (ref 8.9–10.3)
Chloride: 102 mmol/L (ref 101–111)
Creatinine, Ser: 1.22 mg/dL (ref 0.61–1.24)
GFR calc Af Amer: >60 mL/min (ref >60)
GFR calc non Af Amer: >60 mL/min (ref >60)
GLUCOSE: 171 mg/dL — AB (ref 65–99)
Potassium: 4.2 mmol/L (ref 3.5–5.1)
Sodium: 137 mmol/L (ref 135–145)

## 2017-03-02 LAB — GLUCOSE, CAPILLARY
GLUCOSE-CAPILLARY: 152 mg/dL — AB (ref 65–99)
GLUCOSE-CAPILLARY: 210 mg/dL — AB (ref 65–99)
Glucose-Capillary: 119 mg/dL — ABNORMAL HIGH (ref 65–99)

## 2017-03-02 MED ORDER — REGADENOSON 0.4 MG/5ML IV SOLN
INTRAVENOUS | Status: AC
  Filled 2017-03-02: qty 5

## 2017-03-02 MED ORDER — REGADENOSON 0.4 MG/5ML IV SOLN: 0.4000 mg | Freq: Once | INTRAVENOUS | Status: DC

## 2017-03-02 MED ORDER — TECHNETIUM TC 99M TETROFOSMIN IV KIT
10.0000 | PACK | Freq: Once | INTRAVENOUS | Status: AC | PRN
  Administered 2017-03-02: 10 via INTRAVENOUS

## 2017-03-02 MED ORDER — TECHNETIUM TC 99M TETROFOSMIN IV KIT
30.0000 | PACK | Freq: Once | INTRAVENOUS | Status: AC | PRN
  Administered 2017-03-02: 30 via INTRAVENOUS

## 2017-03-02 NOTE — Progress Notes (Addendum)
*  PRELIMINARY RESULTS* Vascular Ultrasound Lower extremity venous duplex has been completed.  Preliminary findings: No evidence of acute DVT or baker's cyst. 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    Farrel DemarkJill Eunice, RDMS, RVT  03/02/2017, 3:23 PM

## 2017-03-02 NOTE — Progress Notes (Signed)
Myoview negative for ischemia. We will see again if needed.  Corine ShelterLUKE Trystian Crisanto PA-C 03/02/2017 2:41 PM

## 2017-03-02 NOTE — Progress Notes (Signed)
Progress Note  Patient Name: Justin Clayton Date of Encounter: 03/02/2017  Primary Cardiologist: Dr. Antoine PocheHochrein   Subjective   Feeling well. No chest pain, sob or palpitations.   Inpatient Medications    Scheduled Meds: . acyclovir  400 mg Oral Daily  . aspirin EC  81 mg Oral Daily  . atorvastatin  40 mg Oral QPM  . enoxaparin (LOVENOX) injection  0.5 mg/kg Subcutaneous Q24H  . gabapentin  300 mg Oral BID  . guaiFENesin  600 mg Oral BID  . insulin aspart  0-5 Units Subcutaneous QHS  . insulin aspart  0-9 Units Subcutaneous TID WC  . insulin glargine  12 Units Subcutaneous Q2200  . sodium chloride flush  3 mL Intravenous Q12H   Continuous Infusions:  PRN Meds: acetaminophen **OR** acetaminophen, albuterol, ondansetron **OR** ondansetron (ZOFRAN) IV   Vital Signs    Vitals:   03/01/17 0500 03/01/17 1414 03/01/17 2142 03/02/17 0528  BP: 133/73 (!) 150/77 (!) 147/81 (!) 145/81  Pulse: 63 74 67 72  Resp: 20 18 18 18   Temp: 98.6 F (37 C) 98 F (36.7 C) 97.8 F (36.6 C) 97.9 F (36.6 C)  TempSrc: Oral Oral Oral Oral  SpO2: 99% 100% 100% 100%  Weight:      Height:        Intake/Output Summary (Last 24 hours) at 03/02/17 0949 Last data filed at 03/02/17 0759  Gross per 24 hour  Intake             1240 ml  Output                0 ml  Net             1240 ml   Filed Weights   03/01/17 0122  Weight: 224 lb 3.2 oz (101.7 kg)    Telemetry    Unable to review as patient seen in nuc med  ECG    None today   Physical Exam   GEN: No acute distress.   Neck: No JVD Cardiac: RRR, no murmurs, rubs, or gallops.  Respiratory: Clear to auscultation bilaterally. GI: Soft, nontender, non-distended  MS: No edema; No deformity. Neuro:  Nonfocal  Psych: Normal affect   Labs    Chemistry Recent Labs Lab 02/28/17 1739 03/01/17 0727 03/02/17 0208  NA 141 138 137  K 3.8 4.0 4.2  CL 105 105 102  CO2 24 24 28   GLUCOSE 126* 134* 171*  BUN 14 11 11     CREATININE 1.52* 1.28* 1.22  CALCIUM 8.9 8.6* 9.0  PROT  --  6.9  --   ALBUMIN  --  3.6  --   AST  --  17  --   ALT  --  16*  --   ALKPHOS  --  33*  --   BILITOT  --  0.5  --   GFRNONAA 46* 57* >60  GFRAA 53* >60 >60  ANIONGAP 12 9 7      Hematology Recent Labs Lab 02/28/17 1739 03/01/17 0727  WBC 7.1 5.9  RBC 3.83* 3.85*  HGB 12.0* 11.9*  HCT 35.2* 35.7*  MCV 91.9 92.7  MCH 31.3 30.9  MCHC 34.1 33.3  RDW 13.0 12.9  PLT 174 92*    Cardiac Enzymes Recent Labs Lab 02/28/17 2153 03/01/17 0134 03/01/17 0727 03/01/17 1227  TROPONINI <0.03 <0.03 <0.03 <0.03    Recent Labs Lab 02/28/17 1746  TROPIPOC 0.00     BNPNo results for input(s): BNP, PROBNP in  the last 168 hours.   DDimer  Recent Labs Lab 02/28/17 2153  DDIMER 0.70*     Radiology    Dg Chest 2 View  Result Date: 02/28/2017 CLINICAL DATA:  Palpitations.  Chest pain. EXAM: CHEST  2 VIEW COMPARISON:  05/13/2012 FINDINGS: There is bilateral chronic interstitial prominence. There is no focal parenchymal opacity. There is no pleural effusion or pneumothorax. The heart and mediastinal contours are unremarkable. The osseous structures are unremarkable. IMPRESSION: No active cardiopulmonary disease. Electronically Signed   By: Elige Ko   On: 02/28/2017 19:09   Ct Angio Chest Pe W Or Wo Contrast  Result Date: 03/01/2017 CLINICAL DATA:  Chest pain.  History of diabetes and asthma. EXAM: CT ANGIOGRAPHY CHEST WITH CONTRAST TECHNIQUE: Multidetector CT imaging of the chest was performed using the standard protocol during bolus administration of intravenous contrast. Multiplanar CT image reconstructions and MIPs were obtained to evaluate the vascular anatomy. CONTRAST:  73 cc Isovue 370 IV COMPARISON:  None. FINDINGS: Cardiovascular: Cardiomegaly without pericardial effusion. Coronary arteriosclerosis along the circumflex and RCA. No aortic aneurysm. Aortic atherosclerosis at the arch. No acute pulmonary embolus.  Mediastinum/Nodes: Mild thyromegaly without discrete thyroid mass. Trachea and mainstem bronchi are patent with slight deviation of the trachea convex to the right. Esophagus is unremarkable. Lungs/Pleura: Subpleural blebs in the periphery of the upper lobes. No pneumonic consolidation, dominant mass, effusion or pneumothorax. Upper Abdomen: No acute abnormality. Musculoskeletal: No chest wall abnormality. No acute or significant osseous findings. Bilateral gynecomastia. Review of the MIP images confirms the above findings. IMPRESSION: 1. Coronary arteriosclerosis. Aortic atherosclerosis without aneurysm or dissection. 2. No acute pulmonary embolus. 3. Bibasilar dependent atelectasis. No acute pulmonary disease. Subpleural blebs bilaterally. Electronically Signed   By: Tollie Eth M.D.   On: 03/01/2017 01:45    Cardiac Studies   Echocardiogram 03/01/17: Study Conclusions - Left ventricle: The cavity size was normal. There was mild concentric hypertrophy. Systolic function was vigorous. The estimated ejection fraction was in the range of 65% to 70%. Wall motion was normal; there were no regional wall motion abnormalities. Doppler parameters are consistent with abnormal left ventricular relaxation (grade 1 diastolic dysfunction). - Aortic valve: Trileaflet; mildly thickened, mildly calcified leaflets. There was mild regurgitation. - Tricuspid valve: There was trivial regurgitation.  Patient Profile     [name] has a PMH significant for DM, HTN, HLD, and drug abuse (cocaine) here with chest pain and palpitation in setting of +Cocaine.   Assessment & Plan    1. Chest pain  - Atypical and typical features with a moderate pretest probability of obstructive CAD as an etiology. Troponin negative. Pending exercise myoview.   2. Acute diastolic CHF - Euvolemic.   3. AKI - resolved. Consider resuming meds.   Signed, Manson Passey, PA  03/02/2017, 9:49 AM    History and  all data above reviewed.  Patient examined.  I agree with the findings as above.  He has had a couple of palpitations but otherwise has felt well.    The patient exam reveals COR:RRR  ,  Lungs: Clear  ,  Abd: Positive bowel sounds, no rebound no guarding, Ext mild chronic left leg edema  .  All available labs, radiology testing, previous records reviewed. Agree with documented assessment and plan. CHEST PAIN:  No objective evidence of ischemia.  No further work up if [employer] is negative.   Fayrene Fearing Casin Federici  11:31 AM  03/02/2017

## 2017-03-02 NOTE — Progress Notes (Signed)
Patient presented for exercise myoview. Tolerated procedure well. Pending final stress imaging result.  

## 2017-03-02 NOTE — Discharge Summary (Signed)
Physician Discharge Summary  Justin PlumRichard Clayton GNF:[id number] DOB: [birth year] DOA: 02/28/2017  [facility]  Admit date: 02/28/2017 Discharge date: 03/02/2017  Time spent: > 35 minutes  Recommendations for Outpatient Follow-up:  1. Myoview negative for ischemia cardiology recommended no further inpatient evaluation 2. Reassess serum creatinine discontinue metformin if serum creatinine 1.5 or above   Discharge Diagnoses:  Active Problems:   Chest pain   HLD (hyperlipidemia)   DM2 (diabetes mellitus, type 2) (HCC)   Essential hypertension   Asthma   Discharge Condition: stable  Diet recommendation: heart healthy  Filed Weights   03/01/17 0122  Weight: 101.7 kg (224 lb 3.2 oz)    History of present illness:  66 year old with past medical history and problems listed above. Who presented with chest pain  Hospital Course:  Chest pain - Evaluated by cardiology while patient was in house. Patient had Myoview which was negative for ischemia and no further cardiac workup is recommended. - Of note patient also had negative troponins  We'll continue metformin as patient's creatinine has come down to 1.2.  For other known medical conditions listed above will continue medication regimen listed below  Procedures:  None  Consultations:  Cardiology  Discharge Exam: Vitals:   03/02/17 1004 03/02/17 1335  BP: (!) 160/62 (!) 164/81  Pulse: (!) 127 73  Resp:  18  Temp:  97.9 F (36.6 C)    General: Patient in no acute distress, alert and awake Cardiovascular: Regular rate and rhythm, no murmurs or rubs Respiratory: No increased work of breathing, no wheezes  Discharge Instructions   Discharge Instructions    Call MD for:  difficulty breathing, headache or visual disturbances    Complete by:  As directed    Call MD for:  temperature >100.4    Complete by:  As directed    Diet - low sodium heart healthy    Complete by:  As directed    Increase activity  slowly    Complete by:  As directed      Current Discharge Medication List    CONTINUE these medications which have NOT CHANGED   Details  acetaminophen (TYLENOL) 325 MG tablet Take 650 mg by mouth every 6 (six) hours as needed for moderate pain.    acyclovir (ZOVIRAX) 800 MG tablet Take 400 mg by mouth daily. Taking 1/2 of 800mg     albuterol (PROVENTIL HFA;VENTOLIN HFA) 108 (90 BASE) MCG/ACT inhaler Inhale 2 puffs into the lungs every 4 (four) hours as needed. For shortness of breath    aspirin EC 81 MG tablet Take 81 mg by mouth daily.    atorvastatin (LIPITOR) 80 MG tablet Take 40 mg by mouth every evening.     Cholecalciferol (VITAMIN D-3) 1000 UNITS CAPS Take 1 capsule by mouth 3 (three) times daily.     Colloidal Oatmeal (GOLD BOND ECZEMA RELIEF) 2 % CREA Apply 1 application topically as needed (dry skin).    DICLOFENAC PO Take 1 tablet by mouth 2 (two) times daily.    docusate sodium (COLACE) 100 MG capsule Take 100 mg by mouth 2 (two) times daily.    fish oil-omega-3 fatty acids 1000 MG capsule Take 1 g by mouth 2 (two) times daily.     GABAPENTIN PO Take 300 mg by mouth 2 (two) times daily.     glipiZIDE (GLUCOTROL) 10 MG tablet Take 10 mg by mouth 2 (two) times daily before a meal.    insulin glargine (LANTUS) 100 units/mL SOLN Inject 12  Units into the skin daily at 10 pm.     lisinopril (PRINIVIL,ZESTRIL) 10 MG tablet Take 5 mg by mouth daily.    Menthol-Methyl Salicylate (MUSCLE RUB) 10-15 % CREA Apply 1 application topically as needed for muscle pain (knee and elbow pain).    metFORMIN (GLUCOPHAGE) 1000 MG tablet Take 1,000 mg by mouth 2 (two) times daily with a meal.    mometasone (ASMANEX) 220 MCG/INH inhaler Inhale 2 puffs into the lungs at bedtime.     Multiple Vitamins-Minerals (MULTIVITAMINS THER. W/MINERALS) TABS Take 1 tablet by mouth daily.    sildenafil (VIAGRA) 100 MG tablet Take 100 mg by mouth daily as needed for erectile dysfunction.     terazosin (HYTRIN) 5 MG capsule Take 5 mg by mouth at bedtime.      STOP taking these medications     dicyclomine (BENTYL) 20 MG tablet      ondansetron (ZOFRAN ODT) 4 MG disintegrating tablet        No Known Allergies    The results of significant diagnostics from this hospitalization (including imaging, microbiology, ancillary and laboratory) are listed below for reference.    Significant Diagnostic Studies: Dg Chest 2 View  Result Date: 02/28/2017 CLINICAL DATA:  Palpitations.  Chest pain. EXAM: CHEST  2 VIEW COMPARISON:  05/13/2012 FINDINGS: There is bilateral chronic interstitial prominence. There is no focal parenchymal opacity. There is no pleural effusion or pneumothorax. The heart and mediastinal contours are unremarkable. The osseous structures are unremarkable. IMPRESSION: No active cardiopulmonary disease. Electronically Signed   By: Elige Ko   On: 02/28/2017 19:09   Ct Angio Chest Pe W Or Wo Contrast  Result Date: 03/01/2017 CLINICAL DATA:  Chest pain.  History of diabetes and asthma. EXAM: CT ANGIOGRAPHY CHEST WITH CONTRAST TECHNIQUE: Multidetector CT imaging of the chest was performed using the standard protocol during bolus administration of intravenous contrast. Multiplanar CT image reconstructions and MIPs were obtained to evaluate the vascular anatomy. CONTRAST:  73 cc Isovue 370 IV COMPARISON:  None. FINDINGS: Cardiovascular: Cardiomegaly without pericardial effusion. Coronary arteriosclerosis along the circumflex and RCA. No aortic aneurysm. Aortic atherosclerosis at the arch. No acute pulmonary embolus. Mediastinum/Nodes: Mild thyromegaly without discrete thyroid mass. Trachea and mainstem bronchi are patent with slight deviation of the trachea convex to the right. Esophagus is unremarkable. Lungs/Pleura: Subpleural blebs in the periphery of the upper lobes. No pneumonic consolidation, dominant mass, effusion or pneumothorax. Upper Abdomen: No acute abnormality.  Musculoskeletal: No chest wall abnormality. No acute or significant osseous findings. Bilateral gynecomastia. Review of the MIP images confirms the above findings. IMPRESSION: 1. Coronary arteriosclerosis. Aortic atherosclerosis without aneurysm or dissection. 2. No acute pulmonary embolus. 3. Bibasilar dependent atelectasis. No acute pulmonary disease. Subpleural blebs bilaterally. Electronically Signed   By: Tollie Eth M.D.   On: 03/01/2017 01:45   Nm Myocar Multi W/spect W/wall Motion / Ef  Result Date: 03/02/2017 CLINICAL DATA:  Chest pain and shortness breath. Diabetes, hypertension, and hyperlipidemia. EXAM: MYOCARDIAL IMAGING WITH SPECT (REST AND PHARMACOLOGIC-STRESS) GATED LEFT VENTRICULAR WALL MOTION STUDY LEFT VENTRICULAR EJECTION FRACTION TECHNIQUE: Standard myocardial SPECT imaging was performed after resting intravenous injection of 10 mCi Tc-33m tetrofosmin. Subsequently, intravenous infusion of Lexiscan was performed under the supervision of the Cardiology staff. At peak effect of the drug, 30 mCi Tc-39m tetrofosmin was injected intravenously and standard myocardial SPECT imaging was performed. Quantitative gated imaging was also performed to evaluate left ventricular wall motion, and estimate left ventricular ejection fraction. COMPARISON:  None. FINDINGS:  Perfusion: No decreased activity in the left ventricle on stress imaging to suggest reversible ischemia or infarction. Wall Motion: Normal left ventricular wall motion. No left ventricular dilation. Left Ventricular Ejection Fraction: 53 % End diastolic volume 84 ml End systolic volume 40 ml IMPRESSION: 1. No reversible ischemia or infarction. 2. Normal left ventricular wall motion. 3. Left ventricular ejection fraction 53% 4. Non invasive risk stratification*: Low *2012 Appropriate Use Criteria for Coronary Revascularization Focused Update: J Am Coll Cardiol. 2012;59(9):857-881. http://content.dementiazones.com.aspx?articleid=1201161  Electronically Signed   By: Myles Rosenthal M.D.   On: 03/02/2017 13:39    Microbiology: No results found for this or any previous visit (from the past 240 hour(s)).   Labs: Basic Metabolic Panel:  Recent Labs Lab 02/28/17 1739 03/01/17 0727 03/02/17 0208  NA 141 138 137  K 3.8 4.0 4.2  CL 105 105 102  CO2 24 24 28   GLUCOSE 126* 134* 171*  BUN 14 11 11   CREATININE 1.52* 1.28* 1.22  CALCIUM 8.9 8.6* 9.0  MG  --  1.5*  --   PHOS  --  3.2  --    Liver Function Tests:  Recent Labs Lab 03/01/17 0727  AST 17  ALT 16*  ALKPHOS 33*  BILITOT 0.5  PROT 6.9  ALBUMIN 3.6   No results for input(s): LIPASE, AMYLASE in the last 168 hours. No results for input(s): AMMONIA in the last 168 hours. CBC:  Recent Labs Lab 02/28/17 1739 03/01/17 0727  WBC 7.1 5.9  HGB 12.0* 11.9*  HCT 35.2* 35.7*  MCV 91.9 92.7  PLT 174 92*   Cardiac Enzymes:  Recent Labs Lab 02/28/17 2153 03/01/17 0134 03/01/17 0727 03/01/17 1227  TROPONINI <0.03 <0.03 <0.03 <0.03   BNP: BNP (last 3 results) No results for input(s): BNP in the last 8760 hours.  ProBNP (last 3 results) No results for input(s): PROBNP in the last 8760 hours.  CBG:  Recent Labs Lab 03/01/17 1145 03/01/17 1707 03/01/17 2140 03/02/17 0647 03/02/17 1132  GLUCAP 175* 125* 130* 152* 210*    Signed:  Penny Pia MD.  [facility] 03/02/2017, 3:04 PM

## 2017-09-28 ENCOUNTER — Encounter (HOSPITAL_COMMUNITY): Payer: Self-pay | Admitting: Emergency Medicine

## 2017-09-28 ENCOUNTER — Emergency Department (HOSPITAL_COMMUNITY): Payer: Medicare Other

## 2017-09-28 ENCOUNTER — Emergency Department (HOSPITAL_COMMUNITY)
Admission: EM | Admit: 2017-09-28 | Discharge: 2017-09-28 | Disposition: A | Discharge status: Home / Self Care | Payer: Medicare Other | Attending: Emergency Medicine | Admitting: Emergency Medicine

## 2017-09-28 DIAGNOSIS — M25532 Pain in left wrist: Secondary | ICD-10-CM | POA: Diagnosis not present

## 2017-09-28 DIAGNOSIS — I10 Essential (primary) hypertension: Secondary | ICD-10-CM | POA: Insufficient documentation

## 2017-09-28 DIAGNOSIS — F17210 Nicotine dependence, cigarettes, uncomplicated: Secondary | ICD-10-CM | POA: Insufficient documentation

## 2017-09-28 DIAGNOSIS — Z7982 Long term (current) use of aspirin: Secondary | ICD-10-CM | POA: Insufficient documentation

## 2017-09-28 DIAGNOSIS — E119 Type 2 diabetes mellitus without complications: Secondary | ICD-10-CM | POA: Insufficient documentation

## 2017-09-28 DIAGNOSIS — J45909 Unspecified asthma, uncomplicated: Secondary | ICD-10-CM | POA: Diagnosis not present

## 2017-09-28 DIAGNOSIS — Z794 Long term (current) use of insulin: Secondary | ICD-10-CM | POA: Diagnosis not present

## 2017-09-28 DIAGNOSIS — Z79899 Other long term (current) drug therapy: Secondary | ICD-10-CM | POA: Diagnosis not present

## 2017-09-28 NOTE — Discharge Instructions (Signed)
Wear the splint for comfort. Continue to take your home medications. Follow up if symptoms worsen. Return here as needed.

## 2017-09-28 NOTE — ED Triage Notes (Signed)
Pt comes in with complaints of left wrist pain that started before Christmas. Denies any new injury. States he is retired but does a lot of things around the house that could lead to overuse.  Has not taken any medication for pain relief.  Ambulatory. No other complaints at this time.

## 2017-09-28 NOTE — ED Provider Notes (Signed)
[facility]-EMERGENCY DEPT Provider Note   CSN: [id number] Arrival date & time: 09/28/17  1355     History   Chief Complaint Chief Complaint  Patient presents with  . Wrist Pain    HPI Justin Clayton is a 67 y.o. male who presents to the ED with wrist pain. The pain started over a week ago and has continued. Patient takes gabapentin and diclofenac for pain. Patient reports being retired but that he does a lot of things at home working. He does no remember a specific injury to the area.   HPI  Past Medical History:  Diagnosis Date  . Asthma   . Diabetes mellitus   . High cholesterol     Patient Active Problem List   Diagnosis Date Noted  . Chest pain 02/28/2017  . HLD (hyperlipidemia) 02/28/2017  . DM2 (diabetes mellitus, type 2) (HCC) 02/28/2017  . Essential hypertension 02/28/2017  . Asthma 02/28/2017    Past Surgical History:  Procedure Laterality Date  . FRACTURE SURGERY Right    R leg  . ROTATOR CUFF REPAIR Bilateral        Home Medications    Prior to Admission medications   Medication Sig Start Date End Date Taking? Authorizing Provider  acetaminophen (TYLENOL) 325 MG tablet Take 650 mg by mouth every 6 (six) hours as needed for moderate pain.    [provider]  acyclovir (ZOVIRAX) 800 MG tablet Take 400 mg by mouth daily. Taking 1/2 of 800mg     [provider]  albuterol (PROVENTIL HFA;VENTOLIN HFA) 108 (90 BASE) MCG/ACT inhaler Inhale 2 puffs into the lungs every 4 (four) hours as needed. For shortness of breath    [provider]  aspirin EC 81 MG tablet Take 81 mg by mouth daily.    [provider]  atorvastatin (LIPITOR) 80 MG tablet Take 40 mg by mouth every evening.     [provider]  Cholecalciferol (VITAMIN D-3) 1000 UNITS CAPS Take 1 capsule by mouth 3 (three) times daily.     [provider]  Colloidal Oatmeal (GOLD BOND ECZEMA RELIEF) 2 % CREA Apply 1 application  topically as needed (dry skin).    [provider]  DICLOFENAC PO Take 1 tablet by mouth 2 (two) times daily.    [provider]  docusate sodium (COLACE) 100 MG capsule Take 100 mg by mouth 2 (two) times daily.    [provider]  fish oil-omega-3 fatty acids 1000 MG capsule Take 1 g by mouth 2 (two) times daily.     [provider]  GABAPENTIN PO Take 300 mg by mouth 2 (two) times daily.     [provider]  glipiZIDE (GLUCOTROL) 10 MG tablet Take 10 mg by mouth 2 (two) times daily before a meal.    [provider]  insulin glargine (LANTUS) 100 units/mL SOLN Inject 12 Units into the skin daily at 10 pm.     [provider]  lisinopril (PRINIVIL,ZESTRIL) 10 MG tablet Take 5 mg by mouth daily.    [provider]  Menthol-Methyl Salicylate (MUSCLE RUB) 10-15 % CREA Apply 1 application topically as needed for muscle pain (knee and elbow pain).    [provider]  metFORMIN (GLUCOPHAGE) 1000 MG tablet Take 1,000 mg by mouth 2 (two) times daily with a meal.    [provider]  mometasone (ASMANEX) 220 MCG/INH inhaler Inhale 2 puffs into the lungs at bedtime.     [provider]  Multiple Vitamins-Minerals (MULTIVITAMINS THER. W/MINERALS) TABS Take 1 tablet by mouth daily.    [provider]  sildenafil (VIAGRA) 100 MG tablet Take 100 mg by mouth daily as needed for erectile dysfunction.    [provider]  terazosin (HYTRIN) 5 MG capsule Take 5 mg by mouth at bedtime.    [provider]    Family History Family History  Problem Relation Age of Onset  . Cancer Mother   . Alcohol abuse Father   . CAD Sister   . Diabetes Neg Hx   . Stroke Neg Hx     Social History Social History   Tobacco Use  . Smoking status: Current Some Day Smoker  . Smokeless tobacco: Never Used  Substance Use Topics  . Alcohol use: No  . Drug use: No     Allergies   Patient has no known  allergies.   Review of Systems Review of Systems  Musculoskeletal: Positive for arthralgias.       Left wrist pain  All other systems reviewed and are negative.    Physical Exam Updated Vital Signs BP (!) 166/106 (BP Location: Left Arm)   Pulse 100   Temp 98.1 F (36.7 C) (Oral)   Resp 18   SpO2 100%   Physical Exam  Constitutional: He appears well-developed and well-nourished. No distress.  HENT:  Head: Normocephalic.  Eyes: EOM are normal.  Neck: Neck supple.  Cardiovascular: Normal rate.  Pulmonary/Chest: Effort normal.  Musculoskeletal:       Left wrist: He exhibits tenderness. He exhibits normal range of motion, no swelling, no deformity and no laceration.  Radial pulses 2+, adequate circulation. Pain with palpation at the base of the left thumb and radial aspect of the wrist. No increase warmth, no erythema or red streaking.  Neurological: He is alert.  Skin: Skin is warm and dry.  Psychiatric: He has a normal mood and affect.  Nursing note and vitals reviewed.    ED Treatments / Results  Labs (all labs ordered are listed, but only abnormal results are displayed) Labs Reviewed - No data to display  Radiology Dg Wrist Complete Left  Result Date: 09/28/2017 CLINICAL DATA:  Pain EXAM: LEFT WRIST - COMPLETE 3+ VIEW COMPARISON:  None. FINDINGS: Frontal, oblique, lateral, and ulnar deviation scaphoid images were obtained. There is no fracture or dislocation. Joint spaces appear normal. No erosive change. IMPRESSION: No fracture or dislocation.  No evident arthropathy. Electronically Signed   By: Bretta Bang III M.D.   On: 09/28/2017 15:42    Procedures Procedures (including critical care time)  Medications Ordered in ED Medications - No data to display   Initial Impression / Assessment and Plan / ED Course  I have reviewed the triage vital signs and the nursing notes. 67 y.o. male with left wrist pain x 1 week stable for d/c without fracture or  dislocation noted on x-ray. No increased warmth or symptoms of gout. No erythema or signs of infection. Will treat for tendonitis. Wrist splint applied by ortho tec. Return precautions discussed.   Final Clinical Impressions(s) / ED Diagnoses   Final diagnoses:  Left wrist pain    ED Discharge Orders    None       Kerrie [place], [place] 09/28/17 1707    Rolan Bucco, MD 09/28/17 1725

## 2018-10-23 ENCOUNTER — Observation Stay (HOSPITAL_COMMUNITY): Payer: Medicare HMO

## 2018-10-23 ENCOUNTER — Other Ambulatory Visit: Payer: Self-pay

## 2018-10-23 ENCOUNTER — Emergency Department (HOSPITAL_COMMUNITY): Payer: Medicare HMO

## 2018-10-23 ENCOUNTER — Inpatient Hospital Stay (HOSPITAL_COMMUNITY)
Admission: EM | Admit: 2018-10-23 | Discharge: 2018-10-26 | DRG: 312 | Disposition: A | Discharge status: Home / Self Care | Payer: Medicare HMO | Attending: Internal Medicine | Admitting: Internal Medicine

## 2018-10-23 ENCOUNTER — Encounter (HOSPITAL_COMMUNITY): Payer: Self-pay

## 2018-10-23 DIAGNOSIS — R5381 Other malaise: Secondary | ICD-10-CM | POA: Diagnosis present

## 2018-10-23 DIAGNOSIS — I951 Orthostatic hypotension: Secondary | ICD-10-CM | POA: Diagnosis not present

## 2018-10-23 DIAGNOSIS — N179 Acute kidney failure, unspecified: Secondary | ICD-10-CM | POA: Diagnosis not present

## 2018-10-23 DIAGNOSIS — R55 Syncope and collapse: Secondary | ICD-10-CM

## 2018-10-23 DIAGNOSIS — E860 Dehydration: Secondary | ICD-10-CM | POA: Diagnosis present

## 2018-10-23 DIAGNOSIS — Z794 Long term (current) use of insulin: Secondary | ICD-10-CM

## 2018-10-23 DIAGNOSIS — D696 Thrombocytopenia, unspecified: Secondary | ICD-10-CM | POA: Diagnosis present

## 2018-10-23 DIAGNOSIS — J4520 Mild intermittent asthma, uncomplicated: Secondary | ICD-10-CM | POA: Diagnosis not present

## 2018-10-23 DIAGNOSIS — K5900 Constipation, unspecified: Secondary | ICD-10-CM

## 2018-10-23 DIAGNOSIS — Z8249 Family history of ischemic heart disease and other diseases of the circulatory system: Secondary | ICD-10-CM

## 2018-10-23 DIAGNOSIS — F17200 Nicotine dependence, unspecified, uncomplicated: Secondary | ICD-10-CM | POA: Diagnosis present

## 2018-10-23 DIAGNOSIS — Z7982 Long term (current) use of aspirin: Secondary | ICD-10-CM

## 2018-10-23 DIAGNOSIS — R339 Retention of urine, unspecified: Secondary | ICD-10-CM | POA: Diagnosis present

## 2018-10-23 DIAGNOSIS — Z79899 Other long term (current) drug therapy: Secondary | ICD-10-CM

## 2018-10-23 DIAGNOSIS — I5189 Other ill-defined heart diseases: Secondary | ICD-10-CM

## 2018-10-23 DIAGNOSIS — I10 Essential (primary) hypertension: Secondary | ICD-10-CM | POA: Diagnosis present

## 2018-10-23 DIAGNOSIS — J45909 Unspecified asthma, uncomplicated: Secondary | ICD-10-CM | POA: Diagnosis present

## 2018-10-23 DIAGNOSIS — E7800 Pure hypercholesterolemia, unspecified: Secondary | ICD-10-CM | POA: Diagnosis present

## 2018-10-23 DIAGNOSIS — E119 Type 2 diabetes mellitus without complications: Secondary | ICD-10-CM

## 2018-10-23 DIAGNOSIS — E785 Hyperlipidemia, unspecified: Secondary | ICD-10-CM | POA: Diagnosis present

## 2018-10-23 HISTORY — DX: Other malaise: R53.81

## 2018-10-23 HISTORY — DX: Syncope and collapse: R55

## 2018-10-23 HISTORY — DX: Thrombocytopenia, unspecified: D69.6

## 2018-10-23 LAB — URINALYSIS, ROUTINE W REFLEX MICROSCOPIC
Bilirubin Urine: NEGATIVE
GLUCOSE, UA: NEGATIVE mg/dL
HGB URINE DIPSTICK: NEGATIVE
Ketones, ur: 5 mg/dL — AB
LEUKOCYTES UA: NEGATIVE
NITRITE: NEGATIVE
Protein, ur: 100 mg/dL — AB
SPECIFIC GRAVITY, URINE: 1.027 (ref 1.005–1.030)
pH: 5 (ref 5.0–8.0)

## 2018-10-23 LAB — CBC
HCT: 37.2 % — ABNORMAL LOW (ref 39.0–52.0)
Hemoglobin: 11.9 g/dL — ABNORMAL LOW (ref 13.0–17.0)
MCH: 30.8 pg (ref 26.0–34.0)
MCHC: 32 g/dL (ref 30.0–36.0)
MCV: 96.4 fL (ref 80.0–100.0)
PLATELETS: 114 10*3/uL — AB (ref 150–400)
RBC: 3.86 MIL/uL — ABNORMAL LOW (ref 4.22–5.81)
RDW: 13.2 % (ref 11.5–15.5)
WBC: 8.3 10*3/uL (ref 4.0–10.5)
nRBC: 0 % (ref 0.0–0.2)

## 2018-10-23 LAB — BASIC METABOLIC PANEL
Anion gap: 11 (ref 5–15)
BUN: 37 mg/dL — ABNORMAL HIGH (ref 8–23)
CALCIUM: 8.9 mg/dL (ref 8.9–10.3)
CO2: 21 mmol/L — ABNORMAL LOW (ref 22–32)
CREATININE: 2.29 mg/dL — AB (ref 0.61–1.24)
Chloride: 107 mmol/L (ref 98–111)
GFR calc Af Amer: 33 mL/min — ABNORMAL LOW (ref >60)
GFR, EST NON AFRICAN AMERICAN: 28 mL/min — AB (ref >60)
Glucose, Bld: 229 mg/dL — ABNORMAL HIGH (ref 70–99)
Potassium: 3.7 mmol/L (ref 3.5–5.1)
SODIUM: 139 mmol/L (ref 135–145)

## 2018-10-23 LAB — HEPATIC FUNCTION PANEL
ALT: 19 U/L (ref 0–44)
AST: 19 U/L (ref 15–41)
Albumin: 3.9 g/dL (ref 3.5–5.0)
Alkaline Phosphatase: 44 U/L (ref 38–126)
BILIRUBIN DIRECT: 0.1 mg/dL (ref 0.0–0.2)
BILIRUBIN INDIRECT: 0.2 mg/dL — AB (ref 0.3–0.9)
BILIRUBIN TOTAL: 0.3 mg/dL (ref 0.3–1.2)
Total Protein: 7.5 g/dL (ref 6.5–8.1)

## 2018-10-23 LAB — D-DIMER, QUANTITATIVE (NOT AT ARMC): D DIMER QUANT: 1.42 ug{FEU}/mL — AB (ref 0.00–0.50)

## 2018-10-23 LAB — POCT I-STAT TROPONIN I: TROPONIN I, POC: 0 ng/mL (ref 0.00–0.08)

## 2018-10-23 LAB — CBG MONITORING, ED: Glucose-Capillary: 216 mg/dL — ABNORMAL HIGH (ref 70–99)

## 2018-10-23 MED ORDER — SODIUM CHLORIDE 0.9% FLUSH: 3.0000 mL | Freq: Once | INTRAVENOUS | Status: DC

## 2018-10-23 MED ORDER — SODIUM CHLORIDE 0.9 % IV BOLUS
500.0000 mL | Freq: Once | INTRAVENOUS | Status: AC
  Administered 2018-10-23: 500 mL via INTRAVENOUS

## 2018-10-23 NOTE — ED Provider Notes (Signed)
[facility]-EMERGENCY DEPT Provider Note   CSN: [id number] Arrival date & time: 10/23/18  1539     History   Chief Complaint Chief Complaint  Patient presents with  . Weakness  . Shortness of Breath    HPI Edwards Mindel is a 68 y.o. male.  HPI Patient states he got up this morning at 3 AM to go to the bathroom.  He lost consciousness falling backwards.  His head went through the drywall.  Loss of consciousness roughly 1 minute.  Has been generally weak since this morning.  Difficulty ambulating.  States has had some mild shortness of breath but denies chest pain.  No focal weakness or numbness.  No vomiting or diarrhea.  Patient is complaining of low back pain after the fall. Past Medical History:  Diagnosis Date  . Asthma   . Diabetes mellitus   . High cholesterol     Patient Active Problem List   Diagnosis Date Noted  . Syncope 10/23/2018  . Thrombocytopenia (HCC) 10/23/2018  . AKI (acute kidney injury) (HCC) 10/23/2018  . Debility 10/23/2018  . Chest pain 02/28/2017  . HLD (hyperlipidemia) 02/28/2017  . DM2 (diabetes mellitus, type 2) (HCC) 02/28/2017  . Essential hypertension 02/28/2017  . Asthma 02/28/2017    Past Surgical History:  Procedure Laterality Date  . FRACTURE SURGERY Right    R leg  . ROTATOR CUFF REPAIR Bilateral         Home Medications    Prior to Admission medications   Medication Sig Start Date End Date Taking? Authorizing Provider  acetaminophen (TYLENOL) 325 MG tablet Take 650 mg by mouth daily as needed for moderate pain.    Yes [provider]  acyclovir (ZOVIRAX) 800 MG tablet Take 400 mg by mouth daily. Taking 1/2 of 800mg    Yes [provider]  albuterol (PROVENTIL HFA;VENTOLIN HFA) 108 (90 BASE) MCG/ACT inhaler Inhale 2 puffs into the lungs every 4 (four) hours as needed. For shortness of breath   Yes [provider]  aspirin EC 81 MG tablet Take 81 mg by mouth daily.   Yes  [provider]  atorvastatin (LIPITOR) 80 MG tablet Take 80 mg by mouth every evening.    Yes [provider]  Cholecalciferol (VITAMIN D-3) 1000 UNITS CAPS Take 1 capsule by mouth daily.    Yes [provider]  Colloidal Oatmeal (GOLD BOND ECZEMA RELIEF) 2 % CREA Apply 1 application topically as needed (dry skin).   Yes [provider]  DICLOFENAC PO Take 75 mg by mouth 2 (two) times daily.    Yes [provider]  docusate sodium (COLACE) 100 MG capsule Take 100 mg by mouth 2 (two) times daily.   Yes [provider]  fish oil-omega-3 fatty acids 1000 MG capsule Take 1 g by mouth 2 (two) times daily.    Yes [provider]  gabapentin (NEURONTIN) 300 MG capsule Take 600 mg by mouth at bedtime.    Yes [provider]  glipiZIDE (GLUCOTROL) 10 MG tablet Take 10 mg by mouth 2 (two) times daily before a meal.   Yes [provider]  insulin glargine (LANTUS) 100 units/mL SOLN Inject 12 Units into the skin daily at 10 pm.    Yes [provider]  lisinopril (PRINIVIL,ZESTRIL) 40 MG tablet Take 40 mg by mouth daily.    Yes [provider]  metFORMIN (GLUCOPHAGE) 1000 MG tablet Take 1,000 mg by mouth 2 (two) times daily with a  meal.   Yes [provider]  Multiple Vitamins-Minerals (MULTIVITAMINS THER. W/MINERALS) TABS Take 1 tablet by mouth daily.   Yes [provider]  pantoprazole (PROTONIX) 40 MG tablet Take 40 mg by mouth daily.   Yes [provider]  terazosin (HYTRIN) 5 MG capsule Take 5 mg by mouth at bedtime.   Yes [provider]    Family History Family History  Problem Relation Age of Onset  . Cancer Mother   . Alcohol abuse Father   . CAD Sister   . Diabetes Neg Hx   . Stroke Neg Hx     Social History Social History   Tobacco Use  . Smoking status: Current Some Day Smoker  . Smokeless tobacco: Never Used  Substance Use Topics  . Alcohol use: No    . Drug use: No     Allergies   Patient has no known allergies.   Review of Systems Review of Systems  Constitutional: Positive for fatigue. Negative for chills and fever.  HENT: Negative for trouble swallowing.   Eyes: Negative for visual disturbance.  Respiratory: Positive for shortness of breath. Negative for cough.   Cardiovascular: Negative for chest pain, palpitations and leg swelling.  Gastrointestinal: Negative for abdominal pain, constipation, diarrhea, nausea and vomiting.  Genitourinary: Negative for dysuria, flank pain, frequency and hematuria.  Musculoskeletal: Positive for back pain. Negative for myalgias and neck pain.  Skin: Negative for rash and wound.  Neurological: Positive for syncope and weakness. Negative for dizziness, light-headedness, numbness and headaches.  All other systems reviewed and are negative.    Physical Exam Updated Vital Signs BP (!) 163/91   Pulse 72   Temp 98.6 F (37 C) (Oral)   Resp 17   Ht 6\' 2"  (1.88 m)   Wt 106.6 kg   SpO2 99%   BMI 30.17 kg/m   Physical Exam Vitals signs and nursing note reviewed.  Constitutional:      Appearance: He is well-developed.  HENT:     Head: Normocephalic and atraumatic.  Eyes:     Pupils: Pupils are equal, round, and reactive to light.  Neck:     Musculoskeletal: Normal range of motion and neck supple.  Cardiovascular:     Rate and Rhythm: Normal rate and regular rhythm.  Pulmonary:     Effort: Pulmonary effort is normal.     Breath sounds: Normal breath sounds.  Abdominal:     General: Bowel sounds are normal.     Palpations: Abdomen is soft.     Tenderness: There is no abdominal tenderness. There is no guarding or rebound.  Musculoskeletal: Normal range of motion.        General: No tenderness.  Skin:    General: Skin is warm and dry.     Findings: No erythema or rash.  Neurological:     Mental Status: He is alert and oriented to person, place, and time.  Psychiatric:         Behavior: Behavior normal.      ED Treatments / Results  Labs (all labs ordered are listed, but only abnormal results are displayed) Labs Reviewed  BASIC METABOLIC PANEL - Abnormal; Notable for the following components:      Result Value   CO2 21 (*)    Glucose, Bld 229 (*)    BUN 37 (*)    Creatinine, Ser 2.29 (*)    GFR calc non Af Amer 28 (*)    GFR calc Af Amer 33 (*)  All other components within normal limits  CBC - Abnormal; Notable for the following components:   RBC 3.86 (*)    Hemoglobin 11.9 (*)    HCT 37.2 (*)    Platelets 114 (*)    All other components within normal limits  HEPATIC FUNCTION PANEL - Abnormal; Notable for the following components:   Indirect Bilirubin 0.2 (*)    All other components within normal limits  URINALYSIS, ROUTINE W REFLEX MICROSCOPIC - Abnormal; Notable for the following components:   Color, Urine AMBER (*)    APPearance HAZY (*)    Ketones, ur 5 (*)    Protein, ur 100 (*)    Bacteria, UA RARE (*)    All other components within normal limits  D-DIMER, QUANTITATIVE (NOT [facility]) - Abnormal; Notable for the following components:   D-Dimer, Quant 1.42 (*)    All other components within normal limits  CBG MONITORING, ED - Abnormal; Notable for the following components:   Glucose-Capillary 216 (*)    All other components within normal limits  TROPONIN I  TROPONIN I  CREATININE, URINE, RANDOM  SODIUM, URINE, RANDOM  CK  TROPONIN I  I-STAT TROPONIN, ED  POCT I-STAT TROPONIN I    EKG EKG Interpretation  Date/Time:  Monday October 23 2018 15:45:17 EST Ventricular Rate:  117 PR Interval:    QRS Duration: 89 QT Interval:  315 QTC Calculation: 440 R Axis:   84 Text Interpretation:  Sinus tachycardia Probable left atrial enlargement Borderline right axis deviation Nonspecific T abnormalities, inferior leads Confirmed by Loren RacerYelverton, Taneisha Fuson ([id number]) on 10/23/2018 11:02:24 PM   Radiology Dg Chest 2 View  Result Date:  10/23/2018 CLINICAL DATA:  Lethargy.  Fall.  Shortness of breath. EXAM: CHEST - 2 VIEW COMPARISON:  Two-view chest x-ray 02/28/2017 FINDINGS: The heart size normal. Lungs are clear. There is no edema or effusion. No focal airspace disease is present. Multilevel degenerative changes are noted in the thoracic spine. IMPRESSION: No active cardiopulmonary disease. Electronically Signed   By: Marin Robertshristopher  Mattern M.D.   On: 10/23/2018 16:19   Dg Lumbar Spine Complete  Result Date: 10/23/2018 CLINICAL DATA:  68 year old male with history of fall and lower back pain EXAM: LUMBAR SPINE - COMPLETE 4+ VIEW COMPARISON:  CT 04/30/2015 FINDINGS: Lumbar Spine: Lumbar vertebral elements maintain normal alignment without evidence of anterolisthesis, retrolisthesis, subluxation. No acute fracture line identified. Vertebral body heights maintained. Disc space heights maintained with no significant degenerative disc disease or endplate changes. Facet hypertrophy is most pronounced at L4-L5. Oblique images demonstrate no displaced pars defect. IMPRESSION: Negative for acute fracture or malalignment of the lumbar spine. Electronically Signed   By: Gilmer MorJaime  Wagner D.O.   On: 10/23/2018 18:34   Ct Head Wo Contrast  Result Date: 10/23/2018 CLINICAL DATA:  Head trauma with generalized weakness and lethargy EXAM: CT HEAD WITHOUT CONTRAST TECHNIQUE: Contiguous axial images were obtained from the base of the skull through the vertex without intravenous contrast. COMPARISON:  None. 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 No large vascular territory infarct, hemorrhage, midline shift or intra-axial mass. No extra-axial fluid collections. No hydrocephalus. Midline fourth ventricle and basal cisterns without effacement. Vascular: No hyperdense vessel or unexpected calcification. Skull: No acute osseous abnormality. No significant calvarial soft tissue swelling  Sinuses/Orbits: Mild ethmoid sinus mucosal thickening. Intact orbits and globes. Clear mastoids. Other: None IMPRESSION: Chronic mild-to-moderate small vessel ischemic disease of periventricular and subcortical white matter. No acute intracranial  abnormality. Electronically Signed   By: Tollie Eth M.D.   On: 10/23/2018 18:34    Procedures Procedures (including critical care time)  Medications Ordered in ED Medications  sodium chloride flush (NS) 0.9 % injection 3 mL (3 mLs Intravenous Not Given 10/23/18 1850)  sodium chloride 0.9 % bolus 500 mL (0 mLs Intravenous Stopped 10/23/18 2034)  sodium chloride 0.9 % bolus 500 mL (500 mLs Intravenous New Bag/Given 10/23/18 2148)     Initial Impression / Assessment and Plan / ED Course  I have reviewed the triage vital signs and the nursing notes.  Pertinent labs & imaging results that were available during my care of the patient were reviewed by me and considered in my medical decision making (see chart for details).     Patient with AKI.  Is also orthostatic.  Suspect due to dehydration.  Initiate IV fluids.  Spoke with hospitalist will see patient emergency department and admit.  Final Clinical Impressions(s) / ED Diagnoses   Final diagnoses:  AKI (acute kidney injury) [facility])  Orthostasis    ED Discharge Orders    None       Loren Racer, MD 10/23/18 2302

## 2018-10-23 NOTE — H&P (Signed)
Justin Clayton:[id number] DOB: 1951-05-27 DOA: 10/23/2018     PCP: [facility]   Outpatient Specialists: all specialist are at [facility]    Patient arrived to ER on 10/23/18 at 1539  Patient coming from: home Lives  With SO     Chief Complaint:  Chief Complaint  Patient presents with  . Weakness  . Shortness of Breath    HPI: Lantz Gasper is a 68 y.o. male with medical history significant of DM 2, hyperlipidemia, asthma HLD, HTN,    Presented with   Fall.  Occurred at night while patient got up to use the restroom. HE fell back. It took his a bit to come around.  He reports afterwards he was too weak to get up he laid on the floor for a while but then able to get up and went to bed. He layed in bed until noon.  Patient though it may have been because his bg dropped. He has generalized fatigue and lethargy.  A bit of shortness of breath and back pain.  He started to feel poorly around midnight. He reports he hit his head on the drywall. Unclear if it was a syncope He reports he has been feeling poorly he has decreased appetite for about a 3-4 days. No vomiting or diarrhea after the fall he has experienced low back pain. No chest pain He has chronic back pain but no change from prior No change in insulin or DM management No sick contacts Smokes on occasion 1 pack last 1 week  No EtOH abuse Dorsal straining to urinate with difficulty emptying his bladder  Regarding pertinent Chronic problems:  Cholesterol on Lipitor. 3 of diabetes on glipizide metformin and Lantus History of hypertension on lisinopril hytrin While in ER: 99/74 now up to 160/70   The following Work up has been ordered so far:  Orders Placed This Encounter  Procedures  . DG Chest 2 View  . DG Lumbar Spine Complete  . CT Head Wo Contrast  . Basic metabolic panel  . CBC  . Hepatic function panel  . Urinalysis, Routine w reflex microscopic  . Cardiac monitoring  . Saline Lock IV,  Maintain IV access  . Orthostatic vital signs  . Consult to hospitalist  . Pulse oximetry, continuous  . I-stat troponin, ED  . CBG monitoring, ED  . POCT i-Stat troponin I  . EKG 12-Lead  . ED EKG     Following Medications were ordered in ER: Medications  sodium chloride flush (NS) 0.9 % injection 3 mL (3 mLs Intravenous Not Given 10/23/18 1850)  sodium chloride 0.9 % bolus 500 mL (has no administration in time range)  sodium chloride 0.9 % bolus 500 mL (0 mLs Intravenous Stopped 10/23/18 2034)    Significant initial  Findings: Abnormal Labs Reviewed  BASIC METABOLIC PANEL - Abnormal; Notable for the following components:      Result Value   CO2 21 (*)    Glucose, Bld 229 (*)    BUN 37 (*)    Creatinine, Ser 2.29 (*)    GFR calc non Af Amer 28 (*)    GFR calc Af Amer 33 (*)    All other components within normal limits  CBC - Abnormal; Notable for the following components:   RBC 3.86 (*)    Hemoglobin 11.9 (*)    HCT 37.2 (*)    Platelets 114 (*)    All other components within normal limits  HEPATIC FUNCTION PANEL -  Abnormal; Notable for the following components:   Indirect Bilirubin 0.2 (*)    All other components within normal limits  URINALYSIS, ROUTINE W REFLEX MICROSCOPIC - Abnormal; Notable for the following components:   Color, Urine AMBER (*)    APPearance HAZY (*)    Ketones, ur 5 (*)    Protein, ur 100 (*)    Bacteria, UA RARE (*)    All other components within normal limits  CBG MONITORING, ED - Abnormal; Notable for the following components:   Glucose-Capillary 216 (*)    All other components within normal limits    Lactic Acid, Venous No results found for: LATICACIDVEN  Na 139 K 3.7  Cr   Up from baseline see below Lab Results  Component Value Date   CREATININE 2.29 (H) 10/23/2018   CREATININE 1.22 03/02/2017   CREATININE 1.28 (H) 03/01/2017      WBC  8.3  HG/HCT stable,     Component Value Date/Time   HGB 11.9 (L) 10/23/2018 1559   HCT  37.2 (L) 10/23/2018 1559    Troponin (Point of Care Test) Recent Labs    10/23/18 1605  TROPIPOC 0.00       UA   no evidence of UTI     CT HEAD  NON acute  CXR -  NON acute    ECG:  Personally reviewed by me showing: HR : 117 Rhythm:   Sinus tachycardia       no evidence of ischemic changes QTC 420     ED Triage Vitals  Enc Vitals Group     BP 10/23/18 1549 99/74     Pulse Rate 10/23/18 1549 (!) 110     Resp 10/23/18 1549 12     Temp 10/23/18 1549 98.6 F (37 C)     Temp Source 10/23/18 1549 Oral     SpO2 10/23/18 1549 100 %     Weight 10/23/18 1550 235 lb (106.6 kg)     Height 10/23/18 1550 6\' 2"  (1.88 m)     Head Circumference --      Peak Flow --      Pain Score 10/23/18 1550 8     Pain Loc --      Pain Edu? --      Excl. in GC? --   TMAX(24)@       Latest  Blood pressure (!) 165/95, pulse 74, temperature 98.6 F (37 C), temperature source Oral, resp. rate 18, height 6\' 2"  (1.88 m), weight 106.6 kg, SpO2 100 %.     Hospitalist was called for admission for possible  syncope dehydration with AKI   Review of Systems:    Pertinent positives include:  Fatigue, fall  Constitutional:  No weight loss, night sweats, Fevers, chills weight loss  HEENT:  No headaches, Difficulty swallowing,Tooth/dental problems,Sore throat,  No sneezing, itching, ear ache, nasal congestion, post nasal drip,  Cardio-vascular:  No chest pain, Orthopnea, PND, anasarca, dizziness, palpitations.no Bilateral lower extremity swelling  GI:  No heartburn, indigestion, abdominal pain, nausea, vomiting, diarrhea, change in bowel habits, loss of appetite, melena, blood in stool, hematemesis Resp:  no shortness of breath at rest. No dyspnea on exertion, No excess mucus, no productive cough, No non-productive cough, No coughing up of blood.No change in color of mucus.No wheezing. Skin:  no rash or lesions. No jaundice GU:  no dysuria, change in color of urine, no urgency or frequency.  No straining to urinate.  No flank pain.  Musculoskeletal:  No joint pain or no joint swelling. No decreased range of motion. No back pain.  Psych:  No change in mood or affect. No depression or anxiety. No memory loss.  Neuro: no localizing neurological complaints, no tingling, no weakness, no double vision, no gait abnormality, no slurred speech, no confusion  All systems reviewed and apart from HOPI all are negative  Past Medical History:   Past Medical History:  Diagnosis Date  . Asthma   . Diabetes mellitus   . High cholesterol       Past Surgical History:  Procedure Laterality Date  . FRACTURE SURGERY Right    R leg  . ROTATOR CUFF REPAIR Bilateral     Social History:  Ambulatory  independently      reports that he has been smoking. He has never used smokeless tobacco. He reports that he does not drink alcohol or use drugs.     Family History:   Family History  Problem Relation Age of Onset  . Cancer Mother   . Alcohol abuse Father   . CAD Sister   . Diabetes Neg Hx   . Stroke Neg Hx     Allergies: No Known Allergies   Prior to Admission medications   Medication Sig Start Date End Date Taking? Authorizing Provider  acetaminophen (TYLENOL) 325 MG tablet Take 650 mg by mouth daily as needed for moderate pain.    Yes [provider]  acyclovir (ZOVIRAX) 800 MG tablet Take 400 mg by mouth daily. Taking 1/2 of 800mg    Yes [provider]  albuterol (PROVENTIL HFA;VENTOLIN HFA) 108 (90 BASE) MCG/ACT inhaler Inhale 2 puffs into the lungs every 4 (four) hours as needed. For shortness of breath   Yes [provider]  aspirin EC 81 MG tablet Take 81 mg by mouth daily.   Yes [provider]  atorvastatin (LIPITOR) 80 MG tablet Take 80 mg by mouth every evening.    Yes [provider]  Cholecalciferol (VITAMIN D-3) 1000 UNITS CAPS Take 1 capsule by mouth daily.    Yes [provider]  Colloidal Oatmeal (GOLD  BOND ECZEMA RELIEF) 2 % CREA Apply 1 application topically as needed (dry skin).   Yes [provider]  DICLOFENAC PO Take 75 mg by mouth 2 (two) times daily.    Yes [provider]  docusate sodium (COLACE) 100 MG capsule Take 100 mg by mouth 2 (two) times daily.   Yes [provider]  fish oil-omega-3 fatty acids 1000 MG capsule Take 1 g by mouth 2 (two) times daily.    Yes [provider]  gabapentin (NEURONTIN) 300 MG capsule Take 600 mg by mouth at bedtime.    Yes [provider]  glipiZIDE (GLUCOTROL) 10 MG tablet Take 10 mg by mouth 2 (two) times daily before a meal.   Yes [provider]  insulin glargine (LANTUS) 100 units/mL SOLN Inject 12 Units into the skin daily at 10 pm.    Yes [provider]  lisinopril (PRINIVIL,ZESTRIL) 40 MG tablet Take 40 mg by mouth daily.    Yes [provider]  metFORMIN (GLUCOPHAGE) 1000 MG tablet Take 1,000 mg by mouth 2 (two) times daily with a meal.   Yes [provider]  Multiple Vitamins-Minerals (MULTIVITAMINS THER. W/MINERALS) TABS Take 1 tablet by mouth daily.   Yes [provider]  pantoprazole (PROTONIX) 40 MG tablet Take 40 mg by mouth daily.   Yes [provider]  terazosin (HYTRIN)  5 MG capsule Take 5 mg by mouth at bedtime.   Yes [provider]   Physical Exam: Blood pressure (!) 165/95, pulse 74, temperature 98.6 F (37 C), temperature source Oral, resp. rate 18, height 6\' 2"  (1.88 m), weight 106.6 kg, SpO2 100 %. 1. General:  in No   Acute distress   well -appearing 2. Psychological: Alert and  Oriented 3. Head/ENT:    Dry Mucous Membranes                          Head Non traumatic, neck supple                           Poor Dentition 4. SKIN:  decreased Skin turgor,  Skin clean Dry and intact no rash 5. Heart: Regular rate and rhythm no  Murmur, no Rub or gallop 6. Lungs:  no wheezes or crackles   7. Abdomen: Soft,   non-tender,  distended  bowel sounds present 8. Lower extremities: no clubbing, cyanosis, no  edema 9. Neurologically   strength 5 out of 5 in all 4 extremities cranial nerves II through XII intact 10. MSK: Normal range of motion   LABS:     Recent Labs  Lab 10/23/18 1559  WBC 8.3  HGB 11.9*  HCT 37.2*  MCV 96.4  PLT 114*   Basic Metabolic Panel: Recent Labs  Lab 10/23/18 1559  NA 139  K 3.7  CL 107  CO2 21*  GLUCOSE 229*  BUN 37*  CREATININE 2.29*  CALCIUM 8.9      Recent Labs  Lab 10/23/18 1853  AST 19  ALT 19  ALKPHOS 44  BILITOT 0.3  PROT 7.5  ALBUMIN 3.9   No results for input(s): LIPASE, AMYLASE in the last 168 hours. No results for input(s): AMMONIA in the last 168 hours.    HbA1C: No results for input(s): HGBA1C in the last 72 hours. CBG: Recent Labs  Lab 10/23/18 1554  GLUCAP 216*      Urine analysis:    Component Value Date/Time   COLORURINE AMBER (A) 10/23/2018 1853   APPEARANCEUR HAZY (A) 10/23/2018 1853   LABSPEC 1.027 10/23/2018 1853   PHURINE 5.0 10/23/2018 1853   GLUCOSEU NEGATIVE 10/23/2018 1853   HGBUR NEGATIVE 10/23/2018 1853   BILIRUBINUR NEGATIVE 10/23/2018 1853   KETONESUR 5 (A) 10/23/2018 1853   PROTEINUR 100 (A) 10/23/2018 1853   UROBILINOGEN 0.2 04/29/2015 2026   NITRITE NEGATIVE 10/23/2018 1853   LEUKOCYTESUR NEGATIVE 10/23/2018 1853      Cultures: No results found for: SDES, SPECREQUEST, CULT, REPTSTATUS   Radiological Exams on Admission: Dg Chest 2 View  Result Date: 10/23/2018 CLINICAL DATA:  Lethargy.  Fall.  Shortness of breath. EXAM: CHEST - 2 VIEW COMPARISON:  Two-view chest x-ray 02/28/2017 FINDINGS: The heart size normal. Lungs are clear. There is no edema or effusion. No focal airspace disease is present. Multilevel degenerative changes are noted in the thoracic spine. IMPRESSION: No active cardiopulmonary disease. Electronically Signed   By: Marin Robertshristopher  Mattern M.D.   On: 10/23/2018 16:19   Dg  Lumbar Spine Complete  Result Date: 10/23/2018 CLINICAL DATA:  68 year old male with history of fall and lower back pain EXAM: LUMBAR SPINE - COMPLETE 4+ VIEW COMPARISON:  CT 04/30/2015 FINDINGS: Lumbar Spine: Lumbar vertebral elements maintain normal alignment without evidence of anterolisthesis, retrolisthesis, subluxation. No acute fracture line identified. Vertebral body heights maintained. Disc  space heights maintained with no significant degenerative disc disease or endplate changes. Facet hypertrophy is most pronounced at L4-L5. Oblique images demonstrate no displaced pars defect. IMPRESSION: Negative for acute fracture or malalignment of the lumbar spine. Electronically Signed   By: Gilmer Mor D.O.   On: 10/23/2018 18:34   Ct Head Wo Contrast  Result Date: 10/23/2018 CLINICAL DATA:  Head trauma with generalized weakness and lethargy EXAM: CT HEAD WITHOUT CONTRAST TECHNIQUE: Contiguous axial images were obtained from the base of the skull through the vertex without intravenous contrast. COMPARISON:  None. 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 No large vascular territory infarct, hemorrhage, midline shift or intra-axial mass. No extra-axial fluid collections. No hydrocephalus. Midline fourth ventricle and basal cisterns without effacement. Vascular: No hyperdense vessel or unexpected calcification. Skull: No acute osseous abnormality. No significant calvarial soft tissue swelling Sinuses/Orbits: Mild ethmoid sinus mucosal thickening. Intact orbits and globes. Clear mastoids. Other: None IMPRESSION: Chronic mild-to-moderate small vessel ischemic disease of periventricular and subcortical white matter. No acute intracranial abnormality. Electronically Signed   By: Tollie Eth M.D.   On: 10/23/2018 18:34    Chart has been reviewed    Assessment/Plan   68 y.o. male with medical history significant of DM 2,  hyperlipidemia, asthma HLD, HTN,  Admitted for syncope dehydration   Present on Admission: . Syncope prior to syncope but will admit to telemetry cycle cardiac enzymes obtain echogram check carotid Dopplers given elevated d-dimer and unclear etiology with elevated creatinine will order VQ scan . HLD (hyperlipidemia)  Stable will continue home medications . Essential hypertension given  initial hypotension we will allow permissive hypertension for tonight . Asthma - chronic stable continue to follow  . Thrombocytopenia (HCC) -chronic unclear etiology but stable Would recommend outpatient follow-up . AKI (acute kidney injury) (HCC) in the setting of decreased p.o. intake as well as urinary complaints of difficulty urination Will rehydrate follow renal status if evidence of urinary retention will Place Foley.  If no improvement will need renal ultrasound . Debility -PT/OT evaluation  prior to discharge DM 2 -  - Order Sensitive     SSI   - continue home insulin regimen but given worsening renal status  Decrease   Lantus 10units,  -  check TSH and HgA1C  - Hold by mouth medications  reorted urinary retention will obtain bladder scan and Place Foley if increased residual  Constipation with abdominal distention   KUB large amount of stool suspicious for constipation may result in decreased p.o. intake causing dehydration.  Would order bowel regimen  And repeat KUB prior to discharge to document clearance  Other plan as per orders.  DVT prophylaxis:    Lovenox     Code Status:  FULL CODE  as per patient   I had personally discussed CODE STATUS with patient    Family Communication:   Family not at  Bedside    Disposition Plan:    To home once workup is complete and patient is stable                     Would benefit from PT/OT eval prior to DC  Ordered                                      Consults called: none    Admission status:  Obs    Level of care      tele  For            Therisa Doyne 10/24/2018, 12:18 AM    [facility]     after 2 AM please page floor coverage PA If 7AM-7PM, please contact the day team taking care of the patient using [email or web address]

## 2018-10-23 NOTE — ED Triage Notes (Addendum)
Pt states that he has been feeling "lousy". Pt states that he fell this morning and was too weak to get up. Pt states that he laid there for a while, then got up and went and laid on the bed. Pt states that he started feeling a little better. Pt states that he has just had generalized weakness and lethargy. Pt also states he has had some shortness of breath and back pain. Pt states he started feeling "not good" around midnight.

## 2018-10-24 ENCOUNTER — Observation Stay (HOSPITAL_BASED_OUTPATIENT_CLINIC_OR_DEPARTMENT_OTHER): Payer: Medicare HMO

## 2018-10-24 ENCOUNTER — Observation Stay (HOSPITAL_COMMUNITY): Payer: Medicare HMO

## 2018-10-24 ENCOUNTER — Other Ambulatory Visit: Payer: Self-pay

## 2018-10-24 DIAGNOSIS — K5900 Constipation, unspecified: Secondary | ICD-10-CM

## 2018-10-24 DIAGNOSIS — I951 Orthostatic hypotension: Secondary | ICD-10-CM

## 2018-10-24 DIAGNOSIS — J4520 Mild intermittent asthma, uncomplicated: Secondary | ICD-10-CM | POA: Diagnosis not present

## 2018-10-24 DIAGNOSIS — N179 Acute kidney failure, unspecified: Secondary | ICD-10-CM | POA: Diagnosis not present

## 2018-10-24 DIAGNOSIS — E119 Type 2 diabetes mellitus without complications: Secondary | ICD-10-CM | POA: Diagnosis not present

## 2018-10-24 DIAGNOSIS — R55 Syncope and collapse: Secondary | ICD-10-CM | POA: Diagnosis not present

## 2018-10-24 HISTORY — DX: Orthostatic hypotension: I95.1

## 2018-10-24 LAB — COMPREHENSIVE METABOLIC PANEL
ALT: 18 U/L (ref 0–44)
AST: 15 U/L (ref 15–41)
Albumin: 3.6 g/dL (ref 3.5–5.0)
Alkaline Phosphatase: 43 U/L (ref 38–126)
Anion gap: 8 (ref 5–15)
BUN: 37 mg/dL — ABNORMAL HIGH (ref 8–23)
CO2: 21 mmol/L — AB (ref 22–32)
Calcium: 8.8 mg/dL — ABNORMAL LOW (ref 8.9–10.3)
Chloride: 109 mmol/L (ref 98–111)
Creatinine, Ser: 1.79 mg/dL — ABNORMAL HIGH (ref 0.61–1.24)
GFR, EST AFRICAN AMERICAN: 44 mL/min — AB (ref >60)
GFR, EST NON AFRICAN AMERICAN: 38 mL/min — AB (ref >60)
Glucose, Bld: 204 mg/dL — ABNORMAL HIGH (ref 70–99)
Potassium: 4.2 mmol/L (ref 3.5–5.1)
SODIUM: 138 mmol/L (ref 135–145)
Total Bilirubin: 0.3 mg/dL (ref 0.3–1.2)
Total Protein: 7.3 g/dL (ref 6.5–8.1)

## 2018-10-24 LAB — GLUCOSE, CAPILLARY
Glucose-Capillary: 151 mg/dL — ABNORMAL HIGH (ref 70–99)
Glucose-Capillary: 157 mg/dL — ABNORMAL HIGH (ref 70–99)
Glucose-Capillary: 159 mg/dL — ABNORMAL HIGH (ref 70–99)
Glucose-Capillary: 193 mg/dL — ABNORMAL HIGH (ref 70–99)
Glucose-Capillary: 202 mg/dL — ABNORMAL HIGH (ref 70–99)
Glucose-Capillary: 212 mg/dL — ABNORMAL HIGH (ref 70–99)
Glucose-Capillary: 212 mg/dL — ABNORMAL HIGH (ref 70–99)

## 2018-10-24 LAB — CBC
HCT: 34.8 % — ABNORMAL LOW (ref 39.0–52.0)
Hemoglobin: 11.2 g/dL — ABNORMAL LOW (ref 13.0–17.0)
MCH: 31.6 pg (ref 26.0–34.0)
MCHC: 32.2 g/dL (ref 30.0–36.0)
MCV: 98.3 fL (ref 80.0–100.0)
NRBC: 0 % (ref 0.0–0.2)
Platelets: 112 10*3/uL — ABNORMAL LOW (ref 150–400)
RBC: 3.54 MIL/uL — ABNORMAL LOW (ref 4.22–5.81)
RDW: 13.1 % (ref 11.5–15.5)
WBC: 8.9 10*3/uL (ref 4.0–10.5)

## 2018-10-24 LAB — HEMOGLOBIN A1C
Hgb A1c MFr Bld: 7.9 % — ABNORMAL HIGH (ref 4.8–5.6)
MEAN PLASMA GLUCOSE: 180.03 mg/dL

## 2018-10-24 LAB — MAGNESIUM: Magnesium: 1.8 mg/dL (ref 1.7–2.4)

## 2018-10-24 LAB — ECHOCARDIOGRAM COMPLETE
HEIGHTINCHES: 74 in
Weight: 3470.92 oz

## 2018-10-24 LAB — SODIUM, URINE, RANDOM: SODIUM UR: 142 mmol/L

## 2018-10-24 LAB — PHOSPHORUS: Phosphorus: 3.2 mg/dL (ref 2.5–4.6)

## 2018-10-24 LAB — CREATININE, URINE, RANDOM: Creatinine, Urine: 169.17 mg/dL

## 2018-10-24 LAB — HIV ANTIBODY (ROUTINE TESTING W REFLEX): HIV Screen 4th Generation wRfx: NONREACTIVE

## 2018-10-24 LAB — TSH: TSH: 0.397 u[IU]/mL (ref 0.350–4.500)

## 2018-10-24 LAB — TROPONIN I
Troponin I: <0.03 ng/mL (ref <0.03)
Troponin I: <0.03 ng/mL (ref <0.03)

## 2018-10-24 MED ORDER — TECHNETIUM TC 99M DIETHYLENETRIAME-PENTAACETIC ACID
30.7000 | Freq: Once | INTRAVENOUS | Status: AC | PRN
  Administered 2018-10-24: 30.7 via INTRAVENOUS

## 2018-10-24 MED ORDER — BISACODYL 10 MG RE SUPP
10.0000 mg | Freq: Once | RECTAL | Status: AC
  Administered 2018-10-24: 10 mg via RECTAL
  Filled 2018-10-24: qty 1

## 2018-10-24 MED ORDER — DOCUSATE SODIUM 100 MG PO CAPS
100.0000 mg | ORAL_CAPSULE | Freq: Two times a day (BID) | ORAL | Status: DC
  Administered 2018-10-24: 100 mg via ORAL
  Filled 2018-10-24: qty 1

## 2018-10-24 MED ORDER — ATORVASTATIN CALCIUM 40 MG PO TABS
80.0000 mg | ORAL_TABLET | Freq: Every evening | ORAL | Status: DC
  Administered 2018-10-24 – 2018-10-25 (×2): 80 mg via ORAL
  Filled 2018-10-24 (×2): qty 2

## 2018-10-24 MED ORDER — INSULIN GLARGINE 100 UNIT/ML ~~LOC~~ SOLN
10.0000 [IU] | Freq: Every day | SUBCUTANEOUS | Status: DC
  Administered 2018-10-24 – 2018-10-25 (×3): 10 [IU] via SUBCUTANEOUS
  Filled 2018-10-24 (×4): qty 0.1

## 2018-10-24 MED ORDER — POLYETHYLENE GLYCOL 3350 17 G PO PACK
17.0000 g | PACK | Freq: Two times a day (BID) | ORAL | Status: DC
  Filled 2018-10-24 (×3): qty 1

## 2018-10-24 MED ORDER — SENNOSIDES-DOCUSATE SODIUM 8.6-50 MG PO TABS
1.0000 | ORAL_TABLET | Freq: Two times a day (BID) | ORAL | Status: DC
  Administered 2018-10-24 – 2018-10-25 (×3): 1 via ORAL
  Filled 2018-10-24 (×4): qty 1

## 2018-10-24 MED ORDER — INSULIN ASPART 100 UNIT/ML ~~LOC~~ SOLN
0.0000 [IU] | SUBCUTANEOUS | Status: DC
  Administered 2018-10-24: 3 [IU] via SUBCUTANEOUS
  Administered 2018-10-24: 2 [IU] via SUBCUTANEOUS
  Administered 2018-10-24: 3 [IU] via SUBCUTANEOUS
  Administered 2018-10-24: 2 [IU] via SUBCUTANEOUS
  Administered 2018-10-24: 3 [IU] via SUBCUTANEOUS
  Administered 2018-10-25: 1 [IU] via SUBCUTANEOUS
  Administered 2018-10-25: 2 [IU] via SUBCUTANEOUS
  Administered 2018-10-25: 3 [IU] via SUBCUTANEOUS
  Administered 2018-10-25: 2 [IU] via SUBCUTANEOUS
  Administered 2018-10-25: 1 [IU] via SUBCUTANEOUS
  Administered 2018-10-26: 2 [IU] via SUBCUTANEOUS
  Administered 2018-10-26: 3 [IU] via SUBCUTANEOUS

## 2018-10-24 MED ORDER — SORBITOL 70 % SOLN
30.0000 mL | Freq: Once | Status: AC
  Administered 2018-10-24: 30 mL via ORAL
  Filled 2018-10-24: qty 30

## 2018-10-24 MED ORDER — ASPIRIN EC 81 MG PO TBEC
81.0000 mg | DELAYED_RELEASE_TABLET | Freq: Every day | ORAL | Status: DC
  Administered 2018-10-24 – 2018-10-25 (×2): 81 mg via ORAL
  Filled 2018-10-24 (×2): qty 1

## 2018-10-24 MED ORDER — ENOXAPARIN SODIUM 40 MG/0.4ML ~~LOC~~ SOLN
40.0000 mg | SUBCUTANEOUS | Status: DC
  Administered 2018-10-24 – 2018-10-25 (×3): 40 mg via SUBCUTANEOUS
  Filled 2018-10-24 (×3): qty 0.4

## 2018-10-24 MED ORDER — ACETAMINOPHEN 325 MG PO TABS
650.0000 mg | ORAL_TABLET | Freq: Four times a day (QID) | ORAL | Status: DC | PRN
  Administered 2018-10-24 – 2018-10-25 (×4): 650 mg via ORAL
  Filled 2018-10-24 (×4): qty 2

## 2018-10-24 MED ORDER — GABAPENTIN 300 MG PO CAPS
600.0000 mg | ORAL_CAPSULE | Freq: Every day | ORAL | Status: DC
  Administered 2018-10-24 – 2018-10-25 (×3): 600 mg via ORAL
  Filled 2018-10-24 (×3): qty 2

## 2018-10-24 MED ORDER — PANTOPRAZOLE SODIUM 40 MG PO TBEC
40.0000 mg | DELAYED_RELEASE_TABLET | Freq: Every day | ORAL | Status: DC
  Administered 2018-10-24 – 2018-10-25 (×2): 40 mg via ORAL
  Filled 2018-10-24 (×2): qty 1

## 2018-10-24 MED ORDER — POLYETHYLENE GLYCOL 3350 17 G PO PACK: 17.0000 g | PACK | Freq: Every day | ORAL | Status: DC | PRN

## 2018-10-24 MED ORDER — ACYCLOVIR 400 MG PO TABS
400.0000 mg | ORAL_TABLET | Freq: Every day | ORAL | Status: DC
  Administered 2018-10-24 – 2018-10-25 (×2): 400 mg via ORAL
  Filled 2018-10-24 (×2): qty 1

## 2018-10-24 MED ORDER — ONDANSETRON HCL 4 MG/2ML IJ SOLN: 4.0000 mg | Freq: Four times a day (QID) | INTRAMUSCULAR | Status: DC | PRN

## 2018-10-24 MED ORDER — ACETAMINOPHEN 650 MG RE SUPP: 650.0000 mg | Freq: Four times a day (QID) | RECTAL | Status: DC | PRN

## 2018-10-24 MED ORDER — MAGNESIUM CITRATE PO SOLN
1.0000 | Freq: Once | ORAL | Status: AC
  Administered 2018-10-24: 1 via ORAL
  Filled 2018-10-24: qty 296

## 2018-10-24 MED ORDER — TECHNETIUM TO 99M ALBUMIN AGGREGATED
4.4000 | Freq: Once | INTRAVENOUS | Status: AC | PRN
  Administered 2018-10-24: 4.4 via INTRAVENOUS

## 2018-10-24 MED ORDER — SODIUM CHLORIDE 0.9 % IV SOLN
INTRAVENOUS | Status: DC
  Administered 2018-10-24 – 2018-10-25 (×4): via INTRAVENOUS

## 2018-10-24 MED ORDER — BISACODYL 10 MG RE SUPP: 10.0000 mg | Freq: Every day | RECTAL | Status: DC | PRN

## 2018-10-24 MED ORDER — HYDROCODONE-ACETAMINOPHEN 5-325 MG PO TABS: 1.0000 | ORAL_TABLET | ORAL | Status: DC | PRN

## 2018-10-24 MED ORDER — ONDANSETRON HCL 4 MG PO TABS: 4.0000 mg | ORAL_TABLET | Freq: Four times a day (QID) | ORAL | Status: DC | PRN

## 2018-10-24 MED ORDER — MILK AND MOLASSES ENEMA
1.0000 | RECTAL | Status: DC | PRN
  Filled 2018-10-24: qty 250

## 2018-10-24 NOTE — Progress Notes (Signed)
Carotid artery duplex has been completed. Preliminary results can be found in CV Proc through chart review.   10/24/18 11:40 AM Olen Cordial RVT

## 2018-10-24 NOTE — Evaluation (Signed)
Physical Therapy Evaluation/Discharge Patient Details Name: Justin Clayton MRN: [id number] DOB: 20-Dec-1950 Today's Date: 10/24/2018   History of Present Illness  68 y.o. male admitted on 10/23/18 for syncopal event with fall.  Pt dx with dehydration.  Medical and cardiac workup in progress.  Pt with significant PMH of  DM, bil RTC repair, and R leg fx surgery.  Clinical Impression  Pt is independent with gait and mobility.  No signs of LOB or gait instability.  No reports of lightheadedness during mobility.  Orthostatics taken by RN tech (see vitals for results) were negative.  PT to sign off as pt does not have any acute or f/u therapy needs at this time.      Follow Up Recommendations No PT follow up    Equipment Recommendations  None recommended by PT    Recommendations for Other Services   NA    Precautions / Restrictions Precautions Precautions: None      Mobility  Bed Mobility Overal bed mobility: Independent                Transfers Overall transfer level: Independent                  Ambulation/Gait Ambulation/Gait assistance: Independent Gait Distance (Feet): 300 Feet Assistive device: None Gait Pattern/deviations: WFL(Within Functional Limits)   Gait velocity interpretation: >4.37 ft/sec, indicative of normal walking speed General Gait Details: steady gait, no reports of lighthededness or dizziness         Balance Overall balance assessment: Needs assistance                           High level balance activites: Backward walking;Turns;Sudden stops;Head turns High Level Balance Comments: all independent without LOB Standardized Balance Assessment Standardized Balance Assessment : Dynamic Gait Index   Dynamic Gait Index Level Surface: Normal Change in Gait Speed: Normal Gait with Horizontal Head Turns: Normal Gait with Vertical Head Turns: Normal Gait and Pivot Turn: Normal Step Over Obstacle: Normal Step Around  Obstacles: Normal       Pertinent Vitals/Pain Pain Assessment: No/denies pain Pain Score: 0-No pain    Home Living Family/patient expects to be discharged to:: Private residence Living Arrangements: Spouse/significant other Available Help at Discharge: Family;Available 24 hours/day Type of Home: House         Home Equipment: None      Prior Function Level of Independence: Independent         Comments: retired, drives, does not use an AD     Hand Dominance   Dominant Hand: Right    Extremity/Trunk Assessment   Upper Extremity Assessment Upper Extremity Assessment: Overall WFL for tasks assessed    Lower Extremity Assessment Lower Extremity Assessment: Overall WFL for tasks assessed    Cervical / Trunk Assessment Cervical / Trunk Assessment: Normal  Communication   Communication: No difficulties  Cognition Arousal/Alertness: Awake/alert Behavior During Therapy: WFL for tasks assessed/performed Overall Cognitive Status: Within Functional Limits for tasks assessed                                        General Comments General comments (skin integrity, edema, etc.): Orthostatics taken by RN tech and were negative.        Assessment/Plan    PT Assessment Patent does not need any further PT services  PT Goals (Current goals can be found in the Care Plan section)  Acute Rehab PT Goals PT Goal Formulation: All assessment and education complete, DC therapy               AM-PAC PT "6 Clicks" Mobility  Outcome Measure Help needed turning from your back to your side while in a flat bed without using bedrails?: None Help needed moving from lying on your back to sitting on the side of a flat bed without using bedrails?: None Help needed moving to and from a bed to a chair (including a wheelchair)?: None Help needed standing up from a chair using your arms (e.g., wheelchair or bedside chair)?: None Help needed to walk in hospital  room?: None Help needed climbing 3-5 steps with a railing? : None 6 Click Score: 24    End of Session   Activity Tolerance: Patient tolerated treatment well Patient left: in bed;with call bell/phone within reach   PT Visit Diagnosis: History of falling (Z91.81)    Time: 8416-6063 PT Time Calculation (min) (ACUTE ONLY): 14 min   Charges:        Lurena Joiner B. Xavien Dauphinais, PT, DPT  Acute Rehabilitation #[phone] pager #[phone] office   PT Evaluation $PT Eval Moderate Complexity: 1 Mod         10/24/2018, 1:48 PM

## 2018-10-24 NOTE — ED Notes (Signed)
ED TO INPATIENT HANDOFF REPORT  Name/Age/Gender Justin Clayton 68 y.o. male  Code Status Code Status History    Date Active Date Inactive Code Status Order ID Comments User Context   03/01/2017 0114 03/02/2017 2019 Full Code [id number]  Therisa Doyne, MD Inpatient    Advance Directive Documentation     Most Recent Value  Type of Advance Directive  Living will  Pre-existing out of facility DNR order (yellow form or pink MOST form)  -  "MOST" Form in Place?  -      Home/SNF/Other Home  Chief Complaint short of breath  Level of Care/Admitting Diagnosis ED Disposition    ED Disposition Condition Comment   Admit  Hospital Area: [facility] [[id number]]  Level of Care: Telemetry [5]  Admit to tele based on following criteria: Eval of Syncope  Diagnosis: Syncope [206001]  Admitting Physician: Therisa Doyne [[id number]]  Attending Physician: Therisa Doyne [[id number]]  PT Class (Do Not Modify): Observation [104]  PT Acc Code (Do Not Modify): Observation [10022]       Medical History Past Medical History:  Diagnosis Date  . Asthma   . Diabetes mellitus   . High cholesterol     Allergies No Known Allergies  IV Location/Drains/Wounds Patient Lines/Drains/Airways Status   Active Line/Drains/Airways    Name:   Placement date:   Placement time:   Site:   Days:   Peripheral IV 10/23/18 Left Forearm   10/23/18    1849    Forearm   1          Labs/Imaging Results for orders placed or performed during the hospital encounter of 10/23/18 (from the past 48 hour(s))  CBG monitoring, ED     Status: Abnormal   Collection Time: 10/23/18  3:54 PM  Result Value Ref Range   Glucose-Capillary 216 (H) 70 - 99 mg/dL  Basic metabolic panel     Status: Abnormal   Collection Time: 10/23/18  3:59 PM  Result Value Ref Range   Sodium 139 135 - 145 mmol/L   Potassium 3.7 3.5 - 5.1 mmol/L   Chloride 107 98 - 111 mmol/L   CO2 21 (L) 22 - 32 mmol/L   Glucose, Bld  229 (H) 70 - 99 mg/dL   BUN 37 (H) 8 - 23 mg/dL   Creatinine, Ser 2.02 (H) 0.61 - 1.24 mg/dL   Calcium 8.9 8.9 - 33.4 mg/dL   GFR calc non Af Amer 28 (L) >60 mL/min   GFR calc Af Amer 33 (L) >60 mL/min   Anion gap 11 5 - 15    Comment: Performed at [facility], [address], [place], [place] [address]  CBC     Status: Abnormal   Collection Time: 10/23/18  3:59 PM  Result Value Ref Range   WBC 8.3 4.0 - 10.5 K/uL   RBC 3.86 (L) 4.22 - 5.81 MIL/uL   Hemoglobin 11.9 (L) 13.0 - 17.0 g/dL   HCT 16.8 (L) 37.2 - 90.2 %   MCV 96.4 80.0 - 100.0 fL   MCH 30.8 26.0 - 34.0 pg   MCHC 32.0 30.0 - 36.0 g/dL   RDW 11.1 55.2 - 08.0 %   Platelets 114 (L) 150 - 400 K/uL    Comment: REPEATED TO VERIFY PLATELET COUNT CONFIRMED BY SMEAR SPECIMEN CHECKED FOR CLOTS    nRBC 0.0 0.0 - 0.2 %    Comment: Performed at [facility], [address] [place], [place] [address]  POCT i-Stat troponin I     Status: None   Collection Time: 10/23/18  4:05 PM  Result Value Ref Range   Troponin i, poc 0.00 0.00 - 0.08 ng/mL   Comment 3            Comment: Due to the release kinetics of cTnI, a negative result within the first hours of the onset of symptoms does not rule out myocardial infarction with certainty. If myocardial infarction is still suspected, repeat the test at appropriate intervals.   Hepatic function panel     Status: Abnormal   Collection Time: 10/23/18  6:53 PM  Result Value Ref Range   Total Protein 7.5 6.5 - 8.1 g/dL   Albumin 3.9 3.5 - 5.0 g/dL   AST 19 15 - 41 U/L   ALT 19 0 - 44 U/L   Alkaline Phosphatase 44 38 - 126 U/L   Total Bilirubin 0.3 0.3 - 1.2 mg/dL   Bilirubin, Direct 0.1 0.0 - 0.2 mg/dL   Indirect Bilirubin 0.2 (L) 0.3 - 0.9 mg/dL    Comment: Performed at [facility], [address] [place], [place] [address]  Urinalysis, Routine w reflex microscopic     Status: Abnormal   Collection Time: 10/23/18  6:53 PM   Result Value Ref Range   Color, Urine AMBER (A) YELLOW    Comment: BIOCHEMICALS MAY BE AFFECTED BY COLOR   APPearance HAZY (A) CLEAR   Specific Gravity, Urine 1.027 1.005 - 1.030   pH 5.0 5.0 - 8.0   Glucose, UA NEGATIVE NEGATIVE mg/dL   Hgb urine dipstick NEGATIVE NEGATIVE   Bilirubin Urine NEGATIVE NEGATIVE   Ketones, ur 5 (A) NEGATIVE mg/dL   Protein, ur 604100 (A) NEGATIVE mg/dL   Nitrite NEGATIVE NEGATIVE   Leukocytes, UA NEGATIVE NEGATIVE   RBC / HPF 0-5 0 - 5 RBC/hpf   WBC, UA 0-5 0 - 5 WBC/hpf   Bacteria, UA RARE (A) NONE SEEN   Squamous Epithelial / LPF 0-5 0 - 5   Mucus PRESENT    Hyaline Casts, UA PRESENT     Comment: Performed at [facility], [address] [place], [place] [address]  D-dimer, quantitative (not at [facility])     Status: Abnormal   Collection Time: 10/23/18  9:52 PM  Result Value Ref Range   D-Dimer, Quant 1.42 (H) 0.00 - 0.50 ug/mL-FEU    Comment: (NOTE) At the manufacturer cut-off of 0.50 ug/mL FEU, this assay has been documented to exclude PE with a sensitivity and negative predictive value of 97 to 99%.  At this time, this assay has not been approved by the FDA to exclude DVT/VTE. Results should be correlated with clinical presentation. Performed at [facility], [address] [place], [place] [address]    Dg Chest 2 View  Result Date: 10/23/2018 CLINICAL DATA:  Lethargy.  Fall.  Shortness of breath. EXAM: CHEST - 2 VIEW COMPARISON:  Two-view chest x-ray 02/28/2017 FINDINGS: The heart size normal. Lungs are clear. There is no edema or effusion. No focal airspace disease is present. Multilevel degenerative changes are noted in the thoracic spine. IMPRESSION: No active cardiopulmonary disease. Electronically Signed   By: Marin Robertshristopher  Mattern M.D.   On: 10/23/2018 16:19   Dg Lumbar Spine Complete  Result Date: 10/23/2018 CLINICAL DATA:  68 year old male with history of fall and lower back pain EXAM: LUMBAR SPINE  - COMPLETE 4+ VIEW COMPARISON:  CT 04/30/2015 FINDINGS: Lumbar Spine: Lumbar vertebral  elements maintain normal alignment without evidence of anterolisthesis, retrolisthesis, subluxation. No acute fracture line identified. Vertebral body heights maintained. Disc space heights maintained with no significant degenerative disc disease or endplate changes. Facet hypertrophy is most pronounced at L4-L5. Oblique images demonstrate no displaced pars defect. IMPRESSION: Negative for acute fracture or malalignment of the lumbar spine. Electronically Signed   By: Gilmer Mor D.O.   On: 10/23/2018 18:34   Dg Abd 1 View  Result Date: 10/23/2018 CLINICAL DATA:  Lower abdominal pain EXAM: ABDOMEN - 1 VIEW COMPARISON:  None. FINDINGS: The bowel gas pattern is normal. No radio-opaque calculi or other significant radiographic abnormality are seen. Large amount of stool in the proximal colon. IMPRESSION: 1. Nonobstructive bowel gas pattern. 2. Large amount of stool in the proximal colon. Electronically Signed   By: Deatra Robinson M.D.   On: 10/23/2018 23:03   Ct Head Wo Contrast  Result Date: 10/23/2018 CLINICAL DATA:  Head trauma with generalized weakness and lethargy EXAM: CT HEAD WITHOUT CONTRAST TECHNIQUE: Contiguous axial images were obtained from the base of the skull through the vertex without intravenous contrast. COMPARISON:  None. 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 No large vascular territory infarct, hemorrhage, midline shift or intra-axial mass. No extra-axial fluid collections. No hydrocephalus. Midline fourth ventricle and basal cisterns without effacement. Vascular: No hyperdense vessel or unexpected calcification. Skull: No acute osseous abnormality. No significant calvarial soft tissue swelling Sinuses/Orbits: Mild ethmoid sinus mucosal thickening. Intact orbits and globes. Clear mastoids. Other: None IMPRESSION: Chronic  mild-to-moderate small vessel ischemic disease of periventricular and subcortical white matter. No acute intracranial abnormality. Electronically Signed   By: Tollie Eth M.D.   On: 10/23/2018 18:34   EKG Interpretation  Date/Time:  Monday October 23 2018 15:45:17 EST Ventricular Rate:  117 PR Interval:    QRS Duration: 89 QT Interval:  315 QTC Calculation: 440 R Axis:   84 Text Interpretation:  Sinus tachycardia Probable left atrial enlargement Borderline right axis deviation Nonspecific T abnormalities, inferior leads Confirmed by Loren Racer ([id number]) on 10/23/2018 11:02:24 PM   Pending Labs Unresulted Labs (From admission, onward)    Start     Ordered   10/23/18 2230  CK  Once,   R     10/23/18 2230   10/23/18 2230  Troponin I -  Once,   R     10/23/18 2230   10/23/18 2200  Creatinine, urine, random  Once,   R     10/23/18 2159   10/23/18 2200  Sodium, urine, random  Once,   R     10/23/18 2159   10/23/18 2154  Troponin I - Now Then Q6H  Now then every 6 hours,   R     10/23/18 2153   Signed and Held  Hemoglobin A1c  Tomorrow morning,   R    Comments:  To assess prior glycemic control    Signed and Held   Signed and Held  HIV antibody (Routine Testing)  Tomorrow morning,   R     Signed and Held   Signed and Held  Magnesium  Tomorrow morning,   R    Comments:  Call MD if <1.5    Signed and Held   Signed and Held  Phosphorus  Tomorrow morning,   R     Signed and Held   Signed and Held  TSH  Once,   R    Comments:  Cancel if  already done within 1 month and notify MD    Signed and Held   Signed and Held  Comprehensive metabolic panel  Once,   R    Comments:  Cal MD for K<3.5 or >5.0    Signed and Held   Signed and Held  CBC  Once,   R    Comments:  Call for hg <8.0    Signed and Held          Vitals/Pain Today's Vitals   10/23/18 1957 10/23/18 2100 10/23/18 2130 10/23/18 2230  BP: (!) 144/93 (!) 165/95 (!) 163/91 (!) 168/94  Pulse: 89 74 72 78  Resp: 20  18 17 15   Temp:      TempSrc:      SpO2: 100% 100% 99% 100%  Weight:      Height:      PainSc:        Isolation Precautions No active isolations  Medications Medications  sodium chloride flush (NS) 0.9 % injection 3 mL (3 mLs Intravenous Not Given 10/23/18 1850)  bisacodyl (DULCOLAX) suppository 10 mg (has no administration in time range)  milk and molasses enema (has no administration in time range)  sodium chloride 0.9 % bolus 500 mL (0 mLs Intravenous Stopped 10/23/18 2034)  sodium chloride 0.9 % bolus 500 mL (0 mLs Intravenous Stopped 10/23/18 2252)    Mobility walks

## 2018-10-24 NOTE — Progress Notes (Signed)
  Echocardiogram 2D Echocardiogram has been performed.  Justin Clayton 10/24/2018, 12:33 PM

## 2018-10-24 NOTE — Progress Notes (Signed)
PROGRESS NOTE    Justin Clayton  JYN:[id number] DOB: Mar 04, 1951 DOA: 10/23/2018 PCP: [facility]    Brief Narrative:  Patient 68 year old gentleman history of type 2 diabetes, hyperlipidemia, hypertension, hyperlipidemia presented with a fall with concern for syncopal episode.  Patient with complaints of fatigue and lethargy.  Patient stated was laying in bed got up to go to the bathroom when he syncopized.  Patient not sure how long he was unconscious for.  Patient also with some complaints of constipation.  Poor oral intake.  Patient admitted for syncope work-up.   Assessment & Plan:   Active Problems:   HLD (hyperlipidemia)   DM2 (diabetes mellitus, type 2) (HCC)   Essential hypertension   Asthma   Syncope   Thrombocytopenia (HCC)   AKI (acute kidney injury) (HCC)   Debility   Orthostasis  1 syncope  Likely secondary to orthostasis.  Patient noted to be orthostatic on admission.  Syncope work-up underway.  Carotid Dopplers with no significant ICA stenosis.  2D echo pending.  CT head unremarkable.  VQ scan negative for acute PE.  No abnormality noted on telemetry.  Patient on IV fluids which we will continue for now.  Repeat orthostatics in the morning.  2.  Orthostasis IV fluids.  Repeat orthostasis in the morning.  3.  Constipation Patient with no bowel movements after Dulcolax suppository.  Patient refused enema.  Continue Senokot twice daily.  Will give mag citrate x1.  Place on MiraLAX twice daily.  Give a dose of sorbitol p.o. x1.  Follow.  4.  Hyperlipidemia Statin.  5.  Acute kidney injury Likely secondary to prerenal azotemia in the setting of decreased oral intake and concern for difficult to urinating.  Creatinine trending down.  Urinalysis nitrite negative leukocytes -100 protein.  Continue IV fluids.  Supportive care.  If renal function does not continue to improve may consider renal ultrasound.  Follow.  6.  Diabetes mellitus type 2 TSH within  normal limits at 0.397.  Hemoglobin A1c 7.9.  CBG of 151 this morning.  Continue Lantus 10 units daily.  Sliding scale insulin.  Follow.  7.  Hypertension Patient noted to be initially borderline hypotensive on admission.  Continue IV fluids.  Follow.  8.  Asthma Stable.  9.  Thrombocytopenia Patient with no overt bleeding.  Follow.  Outpatient follow-up.   DVT prophylaxis: Lovenox Code Status: Full Family Communication: Updated patient.  No family at bedside. Disposition Plan: Likely home once syncope work-up done, orthostasis resolved, clinical improvement and resolution of constipation.   Consultants:   None  Procedures:   2D echo 10/24/2018  Carotid Dopplers 10/24/2018  CT head 10/23/2018  Chest x-ray 10/23/2018  Plain films of the L-spine 10/23/2018  Abdominal film 10/23/2018  VQ scan 10/24/2018  Antimicrobials:   None   Subjective: Patient laying in bed.  States he feels better.  No further syncopal episodes.  Still constipated.  Drinking magnesium citrate.  Denies any chest pain or shortness of breath.  Objective: Vitals:   10/24/18 0155 10/24/18 0200 10/24/18 0450 10/24/18 1330  BP:  (!) 163/95 (!) 153/90 (!) 145/94  Pulse:  79 83 82  Resp:  16 18 18   Temp:  97.7 F (36.5 C) 97.9 F (36.6 C) 98.4 F (36.9 C)  TempSrc:   Oral Oral  SpO2:  100% 100% 100%  Weight: 98.4 kg     Height: 6\' 2"  (1.88 m)       Intake/Output Summary (Last 24 hours) at 10/24/2018 1926  Last data filed at 10/24/2018 1800 Gross per 24 hour  Intake 3434 ml  Output 975 ml  Net 2459 ml   Filed Weights   10/23/18 1550 10/24/18 0155  Weight: 106.6 kg 98.4 kg    Examination:  General exam: NAD Respiratory system: Clear to auscultation. Respiratory effort normal. Cardiovascular system: S1 & S2 heard, RRR. No JVD, murmurs, rubs, gallops or clicks. No pedal edema. Gastrointestinal system: Abdomen is nondistended, soft and nontender. No organomegaly or masses felt. Normal bowel  sounds heard. Central nervous system: Alert and oriented. No focal neurological deficits. Extremities: Symmetric 5 x 5 power. Skin: No rashes, lesions or ulcers Psychiatry: Judgement and insight appear normal. Mood & affect appropriate.     Data Reviewed: I have personally reviewed following labs and imaging studies  CBC: Recent Labs  Lab 10/23/18 1559 10/24/18 0448  WBC 8.3 8.9  HGB 11.9* 11.2*  HCT 37.2* 34.8*  MCV 96.4 98.3  PLT 114* 112*   Basic Metabolic Panel: Recent Labs  Lab 10/23/18 1559 10/24/18 0448  NA 139 138  K 3.7 4.2  CL 107 109  CO2 21* 21*  GLUCOSE 229* 204*  BUN 37* 37*  CREATININE 2.29* 1.79*  CALCIUM 8.9 8.8*  MG  --  1.8  PHOS  --  3.2   GFR: Estimated Creatinine Clearance: 46.6 mL/min (A) (by C-G formula based on SCr of 1.79 mg/dL (H)). Liver Function Tests: Recent Labs  Lab 10/23/18 1853 10/24/18 0448  AST 19 15  ALT 19 18  ALKPHOS 44 43  BILITOT 0.3 0.3  PROT 7.5 7.3  ALBUMIN 3.9 3.6   No results for input(s): LIPASE, AMYLASE in the last 168 hours. No results for input(s): AMMONIA in the last 168 hours. Coagulation Profile: No results for input(s): INR, PROTIME in the last 168 hours. Cardiac Enzymes: Recent Labs  Lab 10/24/18 0448 10/24/18 0946  TROPONINI <0.03 <0.03   BNP (last 3 results) No results for input(s): PROBNP in the last 8760 hours. HbA1C: Recent Labs    10/24/18 0448  HGBA1C 7.9*   CBG: Recent Labs  Lab 10/24/18 0202 10/24/18 0447 10/24/18 0737 10/24/18 1214 10/24/18 1727  GLUCAP 202* 193* 151* 212* 157*   Lipid Profile: No results for input(s): CHOL, HDL, LDLCALC, TRIG, CHOLHDL, LDLDIRECT in the last 72 hours. Thyroid Function Tests: Recent Labs    10/24/18 0448  TSH 0.397   Anemia Panel: No results for input(s): VITAMINB12, FOLATE, FERRITIN, TIBC, IRON, RETICCTPCT in the last 72 hours. Sepsis Labs: No results for input(s): PROCALCITON, LATICACIDVEN in the last 168 hours.  No results  found for this or any previous visit (from the past 240 hour(s)).       Radiology Studies: Dg Chest 2 View  Result Date: 10/23/2018 CLINICAL DATA:  Lethargy.  Fall.  Shortness of breath. EXAM: CHEST - 2 VIEW COMPARISON:  Two-view chest x-ray 02/28/2017 FINDINGS: The heart size normal. Lungs are clear. There is no edema or effusion. No focal airspace disease is present. Multilevel degenerative changes are noted in the thoracic spine. IMPRESSION: No active cardiopulmonary disease. Electronically Signed   By: Marin Roberts M.D.   On: 10/23/2018 16:19   Dg Lumbar Spine Complete  Result Date: 10/23/2018 CLINICAL DATA:  68 year old male with history of fall and lower back pain EXAM: LUMBAR SPINE - COMPLETE 4+ VIEW COMPARISON:  CT 04/30/2015 FINDINGS: Lumbar Spine: Lumbar vertebral elements maintain normal alignment without evidence of anterolisthesis, retrolisthesis, subluxation. No acute fracture line identified. Vertebral body heights  maintained. Disc space heights maintained with no significant degenerative disc disease or endplate changes. Facet hypertrophy is most pronounced at L4-L5. Oblique images demonstrate no displaced pars defect. IMPRESSION: Negative for acute fracture or malalignment of the lumbar spine. Electronically Signed   By: Gilmer MorJaime  Wagner D.O.   On: 10/23/2018 18:34   Dg Abd 1 View  Result Date: 10/23/2018 CLINICAL DATA:  Lower abdominal pain EXAM: ABDOMEN - 1 VIEW COMPARISON:  None. FINDINGS: The bowel gas pattern is normal. No radio-opaque calculi or other significant radiographic abnormality are seen. Large amount of stool in the proximal colon. IMPRESSION: 1. Nonobstructive bowel gas pattern. 2. Large amount of stool in the proximal colon. Electronically Signed   By: Deatra RobinsonKevin  Herman M.D.   On: 10/23/2018 23:03   Ct Head Wo Contrast  Result Date: 10/23/2018 CLINICAL DATA:  Head trauma with generalized weakness and lethargy EXAM: CT HEAD WITHOUT CONTRAST TECHNIQUE:  Contiguous axial images were obtained from the base of the skull through the vertex without intravenous contrast. COMPARISON:  None. 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 No large vascular territory infarct, hemorrhage, midline shift or intra-axial mass. No extra-axial fluid collections. No hydrocephalus. Midline fourth ventricle and basal cisterns without effacement. Vascular: No hyperdense vessel or unexpected calcification. Skull: No acute osseous abnormality. No significant calvarial soft tissue swelling Sinuses/Orbits: Mild ethmoid sinus mucosal thickening. Intact orbits and globes. Clear mastoids. Other: None IMPRESSION: Chronic mild-to-moderate small vessel ischemic disease of periventricular and subcortical white matter. No acute intracranial abnormality. Electronically Signed   By: Tollie Ethavid  Kwon M.D.   On: 10/23/2018 18:34   Nm Pulmonary Perf And Vent  Result Date: 10/24/2018 CLINICAL DATA:  Shortness of breath over the last 6 months which is worsening. Smoking history. EXAM: NUCLEAR MEDICINE VENTILATION - PERFUSION LUNG SCAN TECHNIQUE: Ventilation images were obtained in multiple projections using inhaled aerosol Tc-1175m DTPA. Perfusion images were obtained in multiple projections after intravenous injection of Tc-2975m MAA. RADIOPHARMACEUTICALS:  30.7 mCi of Tc-7875m DTPA aerosol inhalation and 4.4 mCi Tc7175m MAA IV COMPARISON:  Chest radiography 10/23/2018 FINDINGS: Ventilation: Radionuclide clumping in the hilar regions. Allowing for that, ventilation study is normal. Perfusion: Normal perfusion study without any defects. IMPRESSION: Negative V/Q scan, without finding to suggest pulmonary emboli. Normal except for radionuclide clumping on the ventilation study. Electronically Signed   By: Paulina FusiMark  Shogry M.D.   On: 10/24/2018 11:06   Vas [place] Carotid  Result Date: 10/24/2018 Carotid Arterial Duplex Study Indications: Syncope.  Performing Technologist: Chanda BusingGregory Collins RVT  Examination Guidelines: A complete evaluation includes B-mode imaging, spectral Doppler, color Doppler, and power Doppler as needed of all accessible portions of each vessel. Bilateral testing is considered an integral part of a complete examination. Limited examinations for reoccurring indications may be performed as noted.  Right Carotid Findings: +----------+--------+--------+--------+-----------------------+--------+           PSV cm/sEDV cm/sStenosisDescribe               Comments +----------+--------+--------+--------+-----------------------+--------+ CCA Prox  80      6               smooth and heterogenoustortuous +----------+--------+--------+--------+-----------------------+--------+ CCA Distal66      14              smooth and heterogenous         +----------+--------+--------+--------+-----------------------+--------+ ICA Prox  58      14  smooth and heterogenous         +----------+--------+--------+--------+-----------------------+--------+ ICA Distal43      16                                     tortuous +----------+--------+--------+--------+-----------------------+--------+ ECA       68      11                                              +----------+--------+--------+--------+-----------------------+--------+ +----------+--------+-------+--------+-------------------+           PSV cm/sEDV cmsDescribeArm Pressure (mmHG) +----------+--------+-------+--------+-------------------+ [id number]                                         +----------+--------+-------+--------+-------------------+ +---------+--------+--+--------+--+---------+ VertebralPSV cm/s42EDV cm/s12Antegrade +---------+--------+--+--------+--+---------+  Left Carotid Findings: +----------+--------+--------+--------+-----------------------+--------+           PSV cm/sEDV cm/sStenosisDescribe               Comments  +----------+--------+--------+--------+-----------------------+--------+ CCA Prox  95      19              smooth and heterogenous         +----------+--------+--------+--------+-----------------------+--------+ CCA Distal51      14              smooth and heterogenous         +----------+--------+--------+--------+-----------------------+--------+ ICA Prox  49      21              smooth and heterogenous         +----------+--------+--------+--------+-----------------------+--------+ ICA Distal100     41                                     tortuous +----------+--------+--------+--------+-----------------------+--------+ ECA       77      10                                              +----------+--------+--------+--------+-----------------------+--------+ +----------+--------+--------+--------+-------------------+ SubclavianPSV cm/sEDV cm/sDescribeArm Pressure (mmHG) +----------+--------+--------+--------+-------------------+           80                                          +----------+--------+--------+--------+-------------------+ +---------+--------+--+--------+--+---------+ VertebralPSV cm/s52EDV cm/s19Antegrade +---------+--------+--+--------+--+---------+  Summary: Right Carotid: Velocities in the right ICA are consistent with a 1-39% stenosis. Left Carotid: Velocities in the left ICA are consistent with a 1-39% stenosis. Vertebrals: Bilateral vertebral arteries demonstrate antegrade flow. *See table(s) above for measurements and observations.  Electronically signed by Waverly Ferrarihristopher Dickson MD on 10/24/2018 at 5:04:44 PM.    Final         Scheduled Meds: . acyclovir  400 mg Oral Daily  . aspirin EC  81 mg Oral Daily  . atorvastatin  80 mg Oral QPM  . enoxaparin (LOVENOX) injection  40 mg Subcutaneous Q24H  . gabapentin  600 mg Oral QHS  .  insulin aspart  0-9 Units Subcutaneous Q4H  . insulin glargine  10 Units Subcutaneous Q2200  .  pantoprazole  40 mg Oral Daily  . senna-docusate  1 tablet Oral BID  . sodium chloride flush  3 mL Intravenous Once   Continuous Infusions: . sodium chloride 75 mL/hr at 10/24/18 0210     LOS: 0 days    Time spent: 35 minutes    Ramiro Harvest, MD [facility]  If 7PM-7AM, please contact night-coverage [email or web address] 10/24/2018, 7:26 PM

## 2018-10-24 NOTE — Progress Notes (Signed)
OT Cancellation Note  Patient Details Name: Justin Clayton MRN: [id number] DOB: Feb 11, 1951   Cancelled Treatment:    Reason Eval/Treat Not Completed: OT screened, no needs identified, will sign off OT order received, pt chart reviewed. Pt completing BADL at baseline (independent). No further needs identified at this time, OT will sign off. Please re-consult if changes arise, thank you for this consult.   Dalphine Handing, MSOT, OTR/L Behavioral Health OT/ Acute Relief OT [facility] Office: [phone]  Dalphine Handing 10/24/2018, 3:20 PM

## 2018-10-25 DIAGNOSIS — J4520 Mild intermittent asthma, uncomplicated: Secondary | ICD-10-CM | POA: Diagnosis not present

## 2018-10-25 DIAGNOSIS — I10 Essential (primary) hypertension: Secondary | ICD-10-CM | POA: Diagnosis present

## 2018-10-25 DIAGNOSIS — K5900 Constipation, unspecified: Secondary | ICD-10-CM | POA: Diagnosis present

## 2018-10-25 DIAGNOSIS — Z794 Long term (current) use of insulin: Secondary | ICD-10-CM | POA: Diagnosis not present

## 2018-10-25 DIAGNOSIS — R339 Retention of urine, unspecified: Secondary | ICD-10-CM | POA: Diagnosis present

## 2018-10-25 DIAGNOSIS — R5381 Other malaise: Secondary | ICD-10-CM | POA: Diagnosis not present

## 2018-10-25 DIAGNOSIS — I951 Orthostatic hypotension: Secondary | ICD-10-CM | POA: Diagnosis present

## 2018-10-25 DIAGNOSIS — N179 Acute kidney failure, unspecified: Secondary | ICD-10-CM | POA: Diagnosis present

## 2018-10-25 DIAGNOSIS — I351 Nonrheumatic aortic (valve) insufficiency: Secondary | ICD-10-CM | POA: Diagnosis not present

## 2018-10-25 DIAGNOSIS — Z8249 Family history of ischemic heart disease and other diseases of the circulatory system: Secondary | ICD-10-CM | POA: Diagnosis not present

## 2018-10-25 DIAGNOSIS — Z79899 Other long term (current) drug therapy: Secondary | ICD-10-CM | POA: Diagnosis not present

## 2018-10-25 DIAGNOSIS — E7800 Pure hypercholesterolemia, unspecified: Secondary | ICD-10-CM | POA: Diagnosis present

## 2018-10-25 DIAGNOSIS — Z7982 Long term (current) use of aspirin: Secondary | ICD-10-CM | POA: Diagnosis not present

## 2018-10-25 DIAGNOSIS — E860 Dehydration: Secondary | ICD-10-CM | POA: Diagnosis present

## 2018-10-25 DIAGNOSIS — E119 Type 2 diabetes mellitus without complications: Secondary | ICD-10-CM | POA: Diagnosis present

## 2018-10-25 DIAGNOSIS — J45909 Unspecified asthma, uncomplicated: Secondary | ICD-10-CM | POA: Diagnosis present

## 2018-10-25 DIAGNOSIS — E785 Hyperlipidemia, unspecified: Secondary | ICD-10-CM | POA: Diagnosis present

## 2018-10-25 DIAGNOSIS — D696 Thrombocytopenia, unspecified: Secondary | ICD-10-CM | POA: Diagnosis present

## 2018-10-25 DIAGNOSIS — F17200 Nicotine dependence, unspecified, uncomplicated: Secondary | ICD-10-CM | POA: Diagnosis present

## 2018-10-25 LAB — BASIC METABOLIC PANEL
Anion gap: 4 — ABNORMAL LOW (ref 5–15)
BUN: 23 mg/dL (ref 8–23)
CO2: 24 mmol/L (ref 22–32)
Calcium: 8.6 mg/dL — ABNORMAL LOW (ref 8.9–10.3)
Chloride: 112 mmol/L — ABNORMAL HIGH (ref 98–111)
Creatinine, Ser: 1.33 mg/dL — ABNORMAL HIGH (ref 0.61–1.24)
GFR calc Af Amer: >60 mL/min (ref >60)
GFR calc non Af Amer: 55 mL/min — ABNORMAL LOW (ref >60)
Glucose, Bld: 122 mg/dL — ABNORMAL HIGH (ref 70–99)
POTASSIUM: 3.9 mmol/L (ref 3.5–5.1)
Sodium: 140 mmol/L (ref 135–145)

## 2018-10-25 LAB — CBC
HCT: 32 % — ABNORMAL LOW (ref 39.0–52.0)
Hemoglobin: 10 g/dL — ABNORMAL LOW (ref 13.0–17.0)
MCH: 30.4 pg (ref 26.0–34.0)
MCHC: 31.3 g/dL (ref 30.0–36.0)
MCV: 97.3 fL (ref 80.0–100.0)
Platelets: 96 10*3/uL — ABNORMAL LOW (ref 150–400)
RBC: 3.29 MIL/uL — AB (ref 4.22–5.81)
RDW: 12.9 % (ref 11.5–15.5)
WBC: 6.8 10*3/uL (ref 4.0–10.5)
nRBC: 0 % (ref 0.0–0.2)

## 2018-10-25 LAB — GLUCOSE, CAPILLARY
Glucose-Capillary: 112 mg/dL — ABNORMAL HIGH (ref 70–99)
Glucose-Capillary: 133 mg/dL — ABNORMAL HIGH (ref 70–99)
Glucose-Capillary: 215 mg/dL — ABNORMAL HIGH (ref 70–99)
Glucose-Capillary: 139 mg/dL — ABNORMAL HIGH (ref 70–99)
Glucose-Capillary: 173 mg/dL — ABNORMAL HIGH (ref 70–99)

## 2018-10-25 MED ORDER — SODIUM CHLORIDE 0.9 % IV SOLN
INTRAVENOUS | Status: DC
  Administered 2018-10-26 (×2): via INTRAVENOUS

## 2018-10-25 MED ORDER — LISINOPRIL 40 MG PO TABS: 20.0000 mg | ORAL_TABLET | Freq: Every day | ORAL | 1 refills | Status: DC

## 2018-10-25 NOTE — Progress Notes (Signed)
Patient is scheduled for TEE @ [facility] on Thursday, 1.30.2020 @ 1100. Patient needs to arrive via [facility] @ 0930. Spoke with Fleet Contras, RN about setting up transport. Patient will need to be NPO after midnight and have signed consent form sent with [facility].

## 2018-10-25 NOTE — Progress Notes (Signed)
    [facility] has been requested to perform a transesophageal echocardiogram on Justin Clayton for Lt atrial mass.  After careful review of history and examination, the risks and benefits of transesophageal echocardiogram have been explained including risks of esophageal damage, perforation (1:10,000 risk), bleeding, pharyngeal hematoma as well as other potential complications associated with conscious sedation including aspiration, arrhythmia, respiratory failure and death. Alternatives to treatment were discussed, questions were answered. Patient is willing to proceed.   Nada Boozer, NP  10/25/2018 1:33 PM

## 2018-10-25 NOTE — Progress Notes (Signed)
PROGRESS NOTE    Justin Clayton  ZOX:[id number] DOB: [birth year] DOA: 10/23/2018 [facility]  Brief Narrative: 68 year old gentleman history of type 2 diabetes, hyperlipidemia, hypertension, hyperlipidemia presented with a fall with concern for syncopal episode.  Patient with complaints of fatigue and lethargy.  Patient stated was laying in bed got up to go to the bathroom when he syncopized.  Patient not sure how long he was unconscious for.  Patient also with some complaints of constipation.  Poor oral intake.  Patient admitted for syncope work-up.    Assessment & Plan:   Active Problems:   HLD (hyperlipidemia)   DM2 (diabetes mellitus, type 2) (HCC)   Essential hypertension   Asthma   Syncope   Thrombocytopenia (HCC)   AKI (acute kidney injury) (HCC)   Debility   Orthostasis   Constipation   1 syncope  Likely secondary to orthostasis.  Patient noted to be orthostatic on admission.  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  Recommending cardiac MRI or TEE.  Will schedule a TEE with cardiology.  Carotid Dopplers with no significant ICA stenosis.  CT head unremarkable.  VQ scan negative for acute PE.  Out of bed ambulate every shift.  2.  Orthostasis has improved not orthostatic at this time.  However his blood pressure medications were on hold during this hospital stay.  3.  Constipation patient has had multiple bowel movements after treatment with Dulcolax and senna and MiraLAX.  4.  Hyperlipidemia Statin.  5.  Acute kidney injury improved with IV hydration.  6.  Diabetes mellitus type 2 TSH within normal limits at 0.397.  Hemoglobin A1c 7.9.  CBG of 151 this morning.  Continue Lantus 10 units daily.  Sliding scale insulin.  Follow.  7.  Hypertension Patient noted to be initially borderline hypotensive on admission.   8.  Asthma Stable.  9.  Thrombocytopenia platelets continues to drop will hold Lovenox and aspirin  and recheck levels tomorrow.     Estimated body mass index is 28 kg/m as calculated from the following:   Height as of this encounter: 6\' 2"  (1.88 m).   Weight as of this encounter: 98.9 kg.  DVT prophylaxis: SCD due to dropping platelets. Code Status: Full code Family Communication: None Disposition Plan: Pending TEE  Consultants: None  Procedures: None Antimicrobials: None  Subjective: Feels somewhat better anxious to go home denies chest pain shortness of breath headache nausea vomiting  Objective: Vitals:   10/25/18 1017 10/25/18 1018 10/25/18 1020 10/25/18 1024  BP: (!) 154/87 (!) 146/85 132/77 140/78  Pulse: 73 77 83 88  Resp:      Temp:      TempSrc:      SpO2: 99% 99% 100% 99%  Weight:      Height:        Intake/Output Summary (Last 24 hours) at 10/25/2018 1102 Last data filed at 10/25/2018 0403 Gross per 24 hour  Intake 1948.4 ml  Output 450 ml  Net 1498.4 ml   Filed Weights   10/23/18 1550 10/24/18 0155 10/25/18 0424  Weight: 106.6 kg 98.4 kg 98.9 kg    Examination:  General exam: Appears calm and comfortable  Respiratory system: Clear to auscultation. Respiratory effort normal. Cardiovascular system: S1 & S2 heard, RRR. No JVD, murmurs, rubs, gallops or clicks. No pedal edema. Gastrointestinal system: Abdomen is nondistended, soft and nontender. No organomegaly or masses felt. Normal bowel sounds heard. Central nervous system: Alert and oriented. No focal  neurological deficits. Extremities: Symmetric 5 x 5 power. Skin: No rashes, lesions or ulcers Psychiatry: Judgement and insight appear normal. Mood & affect appropriate.     Data Reviewed: I have personally reviewed following labs and imaging studies  CBC: Recent Labs  Lab 10/23/18 1559 10/24/18 0448 10/25/18 0453  WBC 8.3 8.9 6.8  HGB 11.9* 11.2* 10.0*  HCT 37.2* 34.8* 32.0*  MCV 96.4 98.3 97.3  PLT 114* 112* 96*   Basic Metabolic Panel: Recent Labs  Lab 10/23/18 1559  10/24/18 0448 10/25/18 0453  NA 139 138 140  K 3.7 4.2 3.9  CL 107 109 112*  CO2 21* 21* 24  GLUCOSE 229* 204* 122*  BUN 37* 37* 23  CREATININE 2.29* 1.79* 1.33*  CALCIUM 8.9 8.8* 8.6*  MG  --  1.8  --   PHOS  --  3.2  --    GFR: Estimated Creatinine Clearance: 67.8 mL/min (A) (by C-G formula based on SCr of 1.33 mg/dL (H)). Liver Function Tests: Recent Labs  Lab 10/23/18 1853 10/24/18 0448  AST 19 15  ALT 19 18  ALKPHOS 44 43  BILITOT 0.3 0.3  PROT 7.5 7.3  ALBUMIN 3.9 3.6   No results for input(s): LIPASE, AMYLASE in the last 168 hours. No results for input(s): AMMONIA in the last 168 hours. Coagulation Profile: No results for input(s): INR, PROTIME in the last 168 hours. Cardiac Enzymes: Recent Labs  Lab 10/24/18 0448 10/24/18 0946  TROPONINI <0.03 <0.03   BNP (last 3 results) No results for input(s): PROBNP in the last 8760 hours. HbA1C: Recent Labs    10/24/18 0448  HGBA1C 7.9*   CBG: Recent Labs  Lab 10/24/18 1727 10/24/18 2128 10/24/18 2355 10/25/18 0429 10/25/18 0815  GLUCAP 157* 212* 159* 112* 133*   Lipid Profile: No results for input(s): CHOL, HDL, LDLCALC, TRIG, CHOLHDL, LDLDIRECT in the last 72 hours. Thyroid Function Tests: Recent Labs    10/24/18 0448  TSH 0.397   Anemia Panel: No results for input(s): VITAMINB12, FOLATE, FERRITIN, TIBC, IRON, RETICCTPCT in the last 72 hours. Sepsis Labs: No results for input(s): PROCALCITON, LATICACIDVEN in the last 168 hours.  No results found for this or any previous visit (from the past 240 hour(s)).       Radiology Studies: Dg Chest 2 View  Result Date: 10/23/2018 CLINICAL DATA:  Lethargy.  Fall.  Shortness of breath. EXAM: CHEST - 2 VIEW COMPARISON:  Two-view chest x-ray 02/28/2017 FINDINGS: The heart size normal. Lungs are clear. There is no edema or effusion. No focal airspace disease is present. Multilevel degenerative changes are noted in the thoracic spine. IMPRESSION: No active  cardiopulmonary disease. Electronically Signed   By: Marin Roberts M.D.   On: 10/23/2018 16:19   Dg Lumbar Spine Complete  Result Date: 10/23/2018 CLINICAL DATA:  68 year old male with history of fall and lower back pain EXAM: LUMBAR SPINE - COMPLETE 4+ VIEW COMPARISON:  CT 04/30/2015 FINDINGS: Lumbar Spine: Lumbar vertebral elements maintain normal alignment without evidence of anterolisthesis, retrolisthesis, subluxation. No acute fracture line identified. Vertebral body heights maintained. Disc space heights maintained with no significant degenerative disc disease or endplate changes. Facet hypertrophy is most pronounced at L4-L5. Oblique images demonstrate no displaced pars defect. IMPRESSION: Negative for acute fracture or malalignment of the lumbar spine. Electronically Signed   By: Gilmer Mor D.O.   On: 10/23/2018 18:34   Dg Abd 1 View  Result Date: 10/23/2018 CLINICAL DATA:  Lower abdominal pain EXAM: ABDOMEN - 1  VIEW COMPARISON:  None. FINDINGS: The bowel gas pattern is normal. No radio-opaque calculi or other significant radiographic abnormality are seen. Large amount of stool in the proximal colon. IMPRESSION: 1. Nonobstructive bowel gas pattern. 2. Large amount of stool in the proximal colon. Electronically Signed   By: Deatra RobinsonKevin  Herman M.D.   On: 10/23/2018 23:03   Ct Head Wo Contrast  Result Date: 10/23/2018 CLINICAL DATA:  Head trauma with generalized weakness and lethargy EXAM: CT HEAD WITHOUT CONTRAST TECHNIQUE: Contiguous axial images were obtained from the base of the skull through the vertex without intravenous contrast. COMPARISON:  None. 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 No large vascular territory infarct, hemorrhage, midline shift or intra-axial mass. No extra-axial fluid collections. No hydrocephalus. Midline fourth ventricle and basal cisterns without effacement. Vascular: No hyperdense  vessel or unexpected calcification. Skull: No acute osseous abnormality. No significant calvarial soft tissue swelling Sinuses/Orbits: Mild ethmoid sinus mucosal thickening. Intact orbits and globes. Clear mastoids. Other: None IMPRESSION: Chronic mild-to-moderate small vessel ischemic disease of periventricular and subcortical white matter. No acute intracranial abnormality. Electronically Signed   By: Tollie Ethavid  Kwon M.D.   On: 10/23/2018 18:34   Nm Pulmonary Perf And Vent  Result Date: 10/24/2018 CLINICAL DATA:  Shortness of breath over the last 6 months which is worsening. Smoking history. EXAM: NUCLEAR MEDICINE VENTILATION - PERFUSION LUNG SCAN TECHNIQUE: Ventilation images were obtained in multiple projections using inhaled aerosol Tc-1413m DTPA. Perfusion images were obtained in multiple projections after intravenous injection of Tc-1613m MAA. RADIOPHARMACEUTICALS:  30.7 mCi of Tc-2413m DTPA aerosol inhalation and 4.4 mCi Tc6413m MAA IV COMPARISON:  Chest radiography 10/23/2018 FINDINGS: Ventilation: Radionuclide clumping in the hilar regions. Allowing for that, ventilation study is normal. Perfusion: Normal perfusion study without any defects. IMPRESSION: Negative V/Q scan, without finding to suggest pulmonary emboli. Normal except for radionuclide clumping on the ventilation study. Electronically Signed   By: Paulina FusiMark  Shogry M.D.   On: 10/24/2018 11:06   Vas [place] Carotid  Result Date: 10/24/2018 Carotid Arterial Duplex Study Indications: Syncope. Performing Technologist: Chanda BusingGregory Collins RVT  Examination Guidelines: A complete evaluation includes B-mode imaging, spectral Doppler, color Doppler, and power Doppler as needed of all accessible portions of each vessel. Bilateral testing is considered an integral part of a complete examination. Limited examinations for reoccurring indications may be performed as noted.  Right Carotid Findings: +----------+--------+--------+--------+-----------------------+--------+            PSV cm/sEDV cm/sStenosisDescribe               Comments +----------+--------+--------+--------+-----------------------+--------+ CCA Prox  80      6               smooth and heterogenoustortuous +----------+--------+--------+--------+-----------------------+--------+ CCA Distal66      14              smooth and heterogenous         +----------+--------+--------+--------+-----------------------+--------+ ICA Prox  58      14              smooth and heterogenous         +----------+--------+--------+--------+-----------------------+--------+ ICA Distal43      16                                     tortuous +----------+--------+--------+--------+-----------------------+--------+ ECA       68      11                                              +----------+--------+--------+--------+-----------------------+--------+ +----------+--------+-------+--------+-------------------+  PSV cm/sEDV cmsDescribeArm Pressure (mmHG) +----------+--------+-------+--------+-------------------+ [id number]                                         +----------+--------+-------+--------+-------------------+ +---------+--------+--+--------+--+---------+ VertebralPSV cm/s42EDV cm/s12Antegrade +---------+--------+--+--------+--+---------+  Left Carotid Findings: +----------+--------+--------+--------+-----------------------+--------+           PSV cm/sEDV cm/sStenosisDescribe               Comments +----------+--------+--------+--------+-----------------------+--------+ CCA Prox  95      19              smooth and heterogenous         +----------+--------+--------+--------+-----------------------+--------+ CCA Distal51      14              smooth and heterogenous         +----------+--------+--------+--------+-----------------------+--------+ ICA Prox  49      21              smooth and heterogenous          +----------+--------+--------+--------+-----------------------+--------+ ICA Distal100     41                                     tortuous +----------+--------+--------+--------+-----------------------+--------+ ECA       77      10                                              +----------+--------+--------+--------+-----------------------+--------+ +----------+--------+--------+--------+-------------------+ SubclavianPSV cm/sEDV cm/sDescribeArm Pressure (mmHG) +----------+--------+--------+--------+-------------------+           80                                          +----------+--------+--------+--------+-------------------+ +---------+--------+--+--------+--+---------+ VertebralPSV cm/s52EDV cm/s19Antegrade +---------+--------+--+--------+--+---------+  Summary: Right Carotid: Velocities in the right ICA are consistent with a 1-39% stenosis. Left Carotid: Velocities in the left ICA are consistent with a 1-39% stenosis. Vertebrals: Bilateral vertebral arteries demonstrate antegrade flow. *See table(s) above for measurements and observations.  Electronically signed by Waverly Ferrari MD on 10/24/2018 at 5:04:44 PM.    Final         Scheduled Meds: . acyclovir  400 mg Oral Daily  . aspirin EC  81 mg Oral Daily  . atorvastatin  80 mg Oral QPM  . enoxaparin (LOVENOX) injection  40 mg Subcutaneous Q24H  . gabapentin  600 mg Oral QHS  . insulin aspart  0-9 Units Subcutaneous Q4H  . insulin glargine  10 Units Subcutaneous Q2200  . pantoprazole  40 mg Oral Daily  . polyethylene glycol  17 g Oral BID  . senna-docusate  1 tablet Oral BID  . sodium chloride flush  3 mL Intravenous Once   Continuous Infusions: . sodium chloride 100 mL/hr at 10/25/18 0335     LOS: 0 days     Alwyn Ren, MD [facility]   If 7PM-7AM, please contact night-coverage [email or web address] Password [email or web address] 10/25/2018, 11:02 AM

## 2018-10-26 ENCOUNTER — Encounter (HOSPITAL_COMMUNITY)
Admission: EM | Disposition: A | Discharge status: Home / Self Care | Payer: Self-pay | Source: Home / Self Care | Attending: Internal Medicine

## 2018-10-26 ENCOUNTER — Inpatient Hospital Stay (HOSPITAL_COMMUNITY): Payer: Medicare HMO

## 2018-10-26 DIAGNOSIS — I5189 Other ill-defined heart diseases: Secondary | ICD-10-CM

## 2018-10-26 DIAGNOSIS — I351 Nonrheumatic aortic (valve) insufficiency: Secondary | ICD-10-CM

## 2018-10-26 HISTORY — PX: TEE WITHOUT CARDIOVERSION: SHX5443

## 2018-10-26 LAB — BASIC METABOLIC PANEL
ANION GAP: 7 (ref 5–15)
BUN: 17 mg/dL (ref 8–23)
CO2: 24 mmol/L (ref 22–32)
Calcium: 8.8 mg/dL — ABNORMAL LOW (ref 8.9–10.3)
Chloride: 110 mmol/L (ref 98–111)
Creatinine, Ser: 1.39 mg/dL — ABNORMAL HIGH (ref 0.61–1.24)
GFR calc Af Amer: >60 mL/min (ref >60)
GFR, EST NON AFRICAN AMERICAN: 52 mL/min — AB (ref >60)
Glucose, Bld: 177 mg/dL — ABNORMAL HIGH (ref 70–99)
POTASSIUM: 4.3 mmol/L (ref 3.5–5.1)
Sodium: 141 mmol/L (ref 135–145)

## 2018-10-26 LAB — GLUCOSE, CAPILLARY
Glucose-Capillary: 129 mg/dL — ABNORMAL HIGH (ref 70–99)
Glucose-Capillary: 155 mg/dL — ABNORMAL HIGH (ref 70–99)
Glucose-Capillary: 172 mg/dL — ABNORMAL HIGH (ref 70–99)
Glucose-Capillary: 246 mg/dL — ABNORMAL HIGH (ref 70–99)

## 2018-10-26 LAB — CBC
HCT: 33.2 % — ABNORMAL LOW (ref 39.0–52.0)
Hemoglobin: 10.5 g/dL — ABNORMAL LOW (ref 13.0–17.0)
MCH: 30.7 pg (ref 26.0–34.0)
MCHC: 31.6 g/dL (ref 30.0–36.0)
MCV: 97.1 fL (ref 80.0–100.0)
NRBC: 0 % (ref 0.0–0.2)
Platelets: 95 10*3/uL — ABNORMAL LOW (ref 150–400)
RBC: 3.42 MIL/uL — AB (ref 4.22–5.81)
RDW: 12.8 % (ref 11.5–15.5)
WBC: 7.2 10*3/uL (ref 4.0–10.5)

## 2018-10-26 LAB — PROTIME-INR
INR: 1.11
PROTHROMBIN TIME: 14.2 s (ref 11.4–15.2)

## 2018-10-26 SURGERY — ECHOCARDIOGRAM, TRANSESOPHAGEAL
Anesthesia: Moderate Sedation

## 2018-10-26 MED ORDER — MIDAZOLAM HCL (PF) 5 MG/ML IJ SOLN
INTRAMUSCULAR | Status: AC
  Filled 2018-10-26: qty 2

## 2018-10-26 MED ORDER — MIDAZOLAM HCL (PF) 10 MG/2ML IJ SOLN
INTRAMUSCULAR | Status: DC | PRN
  Administered 2018-10-26: 2 mg via INTRAVENOUS

## 2018-10-26 MED ORDER — BUTAMBEN-TETRACAINE-BENZOCAINE 2-2-14 % EX AERO
INHALATION_SPRAY | CUTANEOUS | Status: DC | PRN
  Administered 2018-10-26: 2 via TOPICAL

## 2018-10-26 MED ORDER — FENTANYL CITRATE (PF) 100 MCG/2ML IJ SOLN
INTRAMUSCULAR | Status: DC | PRN
  Administered 2018-10-26: 25 ug via INTRAVENOUS

## 2018-10-26 MED ORDER — FENTANYL CITRATE (PF) 100 MCG/2ML IJ SOLN
INTRAMUSCULAR | Status: AC
  Filled 2018-10-26: qty 2

## 2018-10-26 NOTE — Progress Notes (Addendum)
Went over discharge papers with patient and family.  All questions answered.  VSS.  Walked out with patient to exit.  Pt wanted to take medications at home that were scheduled this am.

## 2018-10-26 NOTE — Care Management Note (Signed)
Case Management Note  Patient Details  Name: Justin Clayton MRN: [id number] Date of Birth: [birth year]  Subjective/Objective:                    Action/Plan: Pt discharging home with no needs.   Expected Discharge Date:  10/26/18               Expected Discharge Plan:  Home/Self Care  In-House Referral:     Discharge planning Services  CM Consult  Post Acute Care Choice:    Choice offered to:     DME Arranged:    DME Agency:     HH Arranged:    HH Agency:     Status of Service:  Completed, signed off  If discussed at [employer] of Stay Meetings, dates discussed:    Additional CommentsGeni Bers, RN 10/26/2018, 12:46 PM

## 2018-10-26 NOTE — Discharge Summary (Signed)
Physician Discharge Summary  [facility]in [facility] ONG:[id number] DOB: January 07, 1951 DOA: 10/23/2018  PCP: [facility]  Admit date: 10/23/2018 Discharge date: 10/26/2018  Admitted From: Home Disposition: Home  Recommendations for Outpatient Follow-up:  1. Follow up with PCP in 1-2 weeks 2. Please obtain BMP/CBC in one week   Home Health none Equipment/Devices: None Discharge Condition stable CODE STATUS full code Diet recommendation: Cardiac Brief/Interim Summary: 68 year old gentleman history of type 2 diabetes, hyperlipidemia, hypertension, hyperlipidemia presented with a fall with concern for syncopal episode. Patient with complaints of fatigue and lethargy. Patient stated was laying in bed got up to go to the bathroom when he syncopized. Patient not sure how long he was unconscious for. Patient also with some complaints of constipation. Poor oral intake. Patient admitted for syncope work-up.   Discharge Diagnoses:  Active Problems:   HLD (hyperlipidemia)   DM2 (diabetes mellitus, type 2) (HCC)   Essential hypertension   Asthma   Syncope   Thrombocytopenia (HCC)   AKI (acute kidney injury) (HCC)   Debility   Orthostasis   Constipation   Left atrial mass   1 syncope  Likely secondary to orthostasis. Patient noted to be orthostatic on admission. 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  So a transesophageal echo was done which did not reveal any mass in the left atrium.  Patient will be discharged home today.  Carotid Dopplers with no significant ICA stenosis. CT head unremarkable. VQ scan negative for acute PE.   2. Orthostasis has improved not orthostatic at this time.  However his blood pressure medications were on hold during this hospital stay.  3. Constipation patient has had multiple bowel movements after treatment with Dulcolax and senna and MiraLAX.  4. Hyperlipidemia Statin.  5. Acute kidney injury improved with  IV hydration.  6. Diabetes mellitus type 2 TSH within normal limits at 0.397. Hemoglobin A1c 7.9. CBG of 151 this morning. Continue Lantus 10 units daily. Sliding scale insulin. Follow.  7. Hypertension Patient noted to be initially borderline hypotensive on admission.  8. Asthma Stable.  Estimated body mass index is 27.99 kg/m as calculated from the following:   Height as of this encounter: 6\' 2"  (1.88 m).   Weight as of this encounter: 98.9 kg.  Discharge Instructions  Discharge Instructions    Call MD for:  difficulty breathing, headache or visual disturbances   Complete by:  As directed    Call MD for:  persistant dizziness or light-headedness   Complete by:  As directed    Call MD for:  persistant nausea and vomiting   Complete by:  As directed    Call MD for:  severe uncontrolled pain   Complete by:  As directed    Diet - low sodium heart healthy   Complete by:  As directed    Increase activity slowly   Complete by:  As directed      Allergies as of 10/26/2018   No Known Allergies     Medication List    STOP taking these medications   DICLOFENAC PO   metFORMIN 1000 MG tablet Commonly known as:  GLUCOPHAGE     TAKE these medications   acetaminophen 325 MG tablet Commonly known as:  TYLENOL Take 650 mg by mouth daily as needed for moderate pain.   acyclovir 800 MG tablet Commonly known as:  ZOVIRAX Take 400 mg by mouth daily. Taking 1/2 of 800mg    albuterol 108 (90 Base) MCG/ACT inhaler Commonly known  as:  PROVENTIL HFA;VENTOLIN HFA Inhale 2 puffs into the lungs every 4 (four) hours as needed. For shortness of breath   aspirin EC 81 MG tablet Take 81 mg by mouth daily.   atorvastatin 80 MG tablet Commonly known as:  LIPITOR Take 80 mg by mouth every evening.   docusate sodium 100 MG capsule Commonly known as:  COLACE Take 100 mg by mouth 2 (two) times daily.   fish oil-omega-3 fatty acids 1000 MG capsule Take 1 g by mouth 2 (two)  times daily.   gabapentin 300 MG capsule Commonly known as:  NEURONTIN Take 600 mg by mouth at bedtime.   glipiZIDE 10 MG tablet Commonly known as:  GLUCOTROL Take 10 mg by mouth 2 (two) times daily before a meal.   GOLD BOND ECZEMA RELIEF 2 % Crea Generic drug:  Colloidal Oatmeal Apply 1 application topically as needed (dry skin).   insulin glargine 100 unit/mL Sopn Commonly known as:  LANTUS Inject 12 Units into the skin daily at 10 pm.   lisinopril 40 MG tablet Commonly known as:  PRINIVIL,ZESTRIL Take 0.5 tablets (20 mg total) by mouth daily. What changed:  how much to take   multivitamins ther. w/minerals Tabs tablet Take 1 tablet by mouth daily.   pantoprazole 40 MG tablet Commonly known as:  PROTONIX Take 40 mg by mouth daily.   terazosin 5 MG capsule Commonly known as:  HYTRIN Take 5 mg by mouth at bedtime.   Vitamin D-3 25 MCG (1000 UT) Caps Take 1 capsule by mouth daily.      Follow-up Information    [facility].   Contact information: [address] [facility] [place] [address] [phone]          No Known Allergies  Consultations:  Cardiology for TEE   Procedures/Studies: Dg Chest 2 View  Result Date: 10/23/2018 CLINICAL DATA:  Lethargy.  Fall.  Shortness of breath. EXAM: CHEST - 2 VIEW COMPARISON:  Two-view chest x-ray 02/28/2017 FINDINGS: The heart size normal. Lungs are clear. There is no edema or effusion. No focal airspace disease is present. Multilevel degenerative changes are noted in the thoracic spine. IMPRESSION: No active cardiopulmonary disease. Electronically Signed   By: Marin Roberts M.D.   On: 10/23/2018 16:19   Dg Lumbar Spine Complete  Result Date: 10/23/2018 CLINICAL DATA:  68 year old male with history of fall and lower back pain EXAM: LUMBAR SPINE - COMPLETE 4+ VIEW COMPARISON:  CT 04/30/2015 FINDINGS: Lumbar Spine: Lumbar vertebral elements maintain normal alignment without  evidence of anterolisthesis, retrolisthesis, subluxation. No acute fracture line identified. Vertebral body heights maintained. Disc space heights maintained with no significant degenerative disc disease or endplate changes. Facet hypertrophy is most pronounced at L4-L5. Oblique images demonstrate no displaced pars defect. IMPRESSION: Negative for acute fracture or malalignment of the lumbar spine. Electronically Signed   By: Gilmer Mor D.O.   On: 10/23/2018 18:34   Dg Abd 1 View  Result Date: 10/23/2018 CLINICAL DATA:  Lower abdominal pain EXAM: ABDOMEN - 1 VIEW COMPARISON:  None. FINDINGS: The bowel gas pattern is normal. No radio-opaque calculi or other significant radiographic abnormality are seen. Large amount of stool in the proximal colon. IMPRESSION: 1. Nonobstructive bowel gas pattern. 2. Large amount of stool in the proximal colon. Electronically Signed   By: Deatra Robinson M.D.   On: 10/23/2018 23:03   Ct Head Wo Contrast  Result Date: 10/23/2018 CLINICAL DATA:  Head trauma with generalized weakness and  lethargy EXAM: CT HEAD WITHOUT CONTRAST TECHNIQUE: Contiguous axial images were obtained from the base of the skull through the vertex without intravenous contrast. COMPARISON:  None. 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 No large vascular territory infarct, hemorrhage, midline shift or intra-axial mass. No extra-axial fluid collections. No hydrocephalus. Midline fourth ventricle and basal cisterns without effacement. Vascular: No hyperdense vessel or unexpected calcification. Skull: No acute osseous abnormality. No significant calvarial soft tissue swelling Sinuses/Orbits: Mild ethmoid sinus mucosal thickening. Intact orbits and globes. Clear mastoids. Other: None IMPRESSION: Chronic mild-to-moderate small vessel ischemic disease of periventricular and subcortical white matter. No acute intracranial abnormality.  Electronically Signed   By: Tollie Eth M.D.   On: 10/23/2018 18:34   Nm Pulmonary Perf And Vent  Result Date: 10/24/2018 CLINICAL DATA:  Shortness of breath over the last 6 months which is worsening. Smoking history. EXAM: NUCLEAR MEDICINE VENTILATION - PERFUSION LUNG SCAN TECHNIQUE: Ventilation images were obtained in multiple projections using inhaled aerosol Tc-14m DTPA. Perfusion images were obtained in multiple projections after intravenous injection of Tc-56m MAA. RADIOPHARMACEUTICALS:  30.7 mCi of Tc-28m DTPA aerosol inhalation and 4.4 mCi Tc16m MAA IV COMPARISON:  Chest radiography 10/23/2018 FINDINGS: Ventilation: Radionuclide clumping in the hilar regions. Allowing for that, ventilation study is normal. Perfusion: Normal perfusion study without any defects. IMPRESSION: Negative V/Q scan, without finding to suggest pulmonary emboli. Normal except for radionuclide clumping on the ventilation study. Electronically Signed   By: Paulina Fusi M.D.   On: 10/24/2018 11:06   Vas US Carotid  Result Date: 10/24/2018 Carotid Arterial Duplex Study Indications: Syncope. Performing Technologist: Chanda Busing RVT  Examination Guidelines: A complete evaluation includes B-mode imaging, spectral Doppler, color Doppler, and power Doppler as needed of all accessible portions of each vessel. Bilateral testing is considered an integral part of a complete examination. Limited examinations for reoccurring indications may be performed as noted.  Right Carotid Findings: +----------+--------+--------+--------+-----------------------+--------+           PSV cm/sEDV cm/sStenosisDescribe               Comments +----------+--------+--------+--------+-----------------------+--------+ CCA Prox  80      6               smooth and heterogenoustortuous +----------+--------+--------+--------+-----------------------+--------+ CCA Distal66      14              smooth and heterogenous          +----------+--------+--------+--------+-----------------------+--------+ ICA Prox  58      14              smooth and heterogenous         +----------+--------+--------+--------+-----------------------+--------+ ICA Distal43      16                                     tortuous +----------+--------+--------+--------+-----------------------+--------+ ECA       68      11                                              +----------+--------+--------+--------+-----------------------+--------+ +----------+--------+-------+--------+-------------------+           PSV cm/sEDV cmsDescribeArm Pressure (mmHG) +----------+--------+-------+--------+-------------------+ [id number]                                         +----------+--------+-------+--------+-------------------+ +---------+--------+--+--------+--+---------+  VertebralPSV cm/s42EDV cm/s12Antegrade +---------+--------+--+--------+--+---------+  Left Carotid Findings: +----------+--------+--------+--------+-----------------------+--------+           PSV cm/sEDV cm/sStenosisDescribe               Comments +----------+--------+--------+--------+-----------------------+--------+ CCA Prox  95      19              smooth and heterogenous         +----------+--------+--------+--------+-----------------------+--------+ CCA Distal51      14              smooth and heterogenous         +----------+--------+--------+--------+-----------------------+--------+ ICA Prox  49      21              smooth and heterogenous         +----------+--------+--------+--------+-----------------------+--------+ ICA Distal100     41                                     tortuous +----------+--------+--------+--------+-----------------------+--------+ ECA       77      10                                              +----------+--------+--------+--------+-----------------------+--------+  +----------+--------+--------+--------+-------------------+ SubclavianPSV cm/sEDV cm/sDescribeArm Pressure (mmHG) +----------+--------+--------+--------+-------------------+           80                                          +----------+--------+--------+--------+-------------------+ +---------+--------+--+--------+--+---------+ VertebralPSV cm/s52EDV cm/s19Antegrade +---------+--------+--+--------+--+---------+  Summary: Right Carotid: Velocities in the right ICA are consistent with a 1-39% stenosis. Left Carotid: Velocities in the left ICA are consistent with a 1-39% stenosis. Vertebrals: Bilateral vertebral arteries demonstrate antegrade flow. *See table(s) above for measurements and observations.  Electronically signed by Waverly Ferrarihristopher Dickson MD on 10/24/2018 at 5:04:44 PM.    Final     (Echo, Carotid, EGD, Colonoscopy, ERCP)    Subjective:  Feels good better than yesterday anxious about the procedure Discharge Exam: Vitals:   10/26/18 1140 10/26/18 1146  BP: 130/84 (!) 137/96  Pulse: 65 62  Resp: 19 17  Temp:    SpO2: 98% 97%   Vitals:   10/26/18 1120 10/26/18 1130 10/26/18 1140 10/26/18 1146  BP: (!) 145/71  130/84 (!) 137/96  Pulse: 64 64 65 62  Resp: 18 18 19 17   Temp:      TempSrc:      SpO2: 97% 97% 98% 97%  Weight:      Height:        General: Pt is alert, awake, not in acute distress Cardiovascular: RRR, S1/S2 +, no rubs, no gallops Respiratory: CTA bilaterally, no wheezing, no rhonchi Abdominal: Soft, NT, ND, bowel sounds + Extremities: no edema, no cyanosis    The results of significant diagnostics from this hospitalization (including imaging, microbiology, ancillary and laboratory) are listed below for reference.     Microbiology: No results found for this or any previous visit (from the past 240 hour(s)).   Labs: BNP (last 3 results) No results for input(s): BNP in the last 8760 hours. Basic Metabolic Panel: Recent Labs  Lab  10/23/18 1559 10/24/18 0448 10/25/18 [id number] 10/26/18 [id number]  NA 139 138 140 141  K 3.7 4.2 3.9 4.3  CL 107 109 112* 110  CO2 21* 21* 24 24  GLUCOSE 229* 204* 122* 177*  BUN 37* 37* 23 17  CREATININE 2.29* 1.79* 1.33* 1.39*  CALCIUM 8.9 8.8* 8.6* 8.8*  MG  --  1.8  --   --   PHOS  --  3.2  --   --    Liver Function Tests: Recent Labs  Lab 10/23/18 1853 10/24/18 0448  AST 19 15  ALT 19 18  ALKPHOS 44 43  BILITOT 0.3 0.3  PROT 7.5 7.3  ALBUMIN 3.9 3.6   No results for input(s): LIPASE, AMYLASE in the last 168 hours. No results for input(s): AMMONIA in the last 168 hours. CBC: Recent Labs  Lab 10/23/18 1559 10/24/18 0448 10/25/18 0453 10/26/18 0433  WBC 8.3 8.9 6.8 7.2  HGB 11.9* 11.2* 10.0* 10.5*  HCT 37.2* 34.8* 32.0* 33.2*  MCV 96.4 98.3 97.3 97.1  PLT 114* 112* 96* 95*   Cardiac Enzymes: Recent Labs  Lab 10/24/18 0448 10/24/18 0946  TROPONINI <0.03 <0.03   BNP: Invalid input(s): POCBNP CBG: Recent Labs  Lab 10/25/18 1643 10/25/18 2035 10/26/18 0004 10/26/18 0440 10/26/18 0737  GLUCAP 139* 173* 246* 172* 155*   D-Dimer Recent Labs    10/23/18 2152  DDIMER 1.42*   Hgb A1c Recent Labs    10/24/18 0448  HGBA1C 7.9*   Lipid Profile No results for input(s): CHOL, HDL, LDLCALC, TRIG, CHOLHDL, LDLDIRECT in the last 72 hours. Thyroid function studies Recent Labs    10/24/18 0448  TSH 0.397   Anemia work up No results for input(s): VITAMINB12, FOLATE, FERRITIN, TIBC, IRON, RETICCTPCT in the last 72 hours. Urinalysis    Component Value Date/Time   COLORURINE AMBER (A) 10/23/2018 1853   APPEARANCEUR HAZY (A) 10/23/2018 1853   LABSPEC 1.027 10/23/2018 1853   PHURINE 5.0 10/23/2018 1853   GLUCOSEU NEGATIVE 10/23/2018 1853   HGBUR NEGATIVE 10/23/2018 1853   BILIRUBINUR NEGATIVE 10/23/2018 1853   KETONESUR 5 (A) 10/23/2018 1853   PROTEINUR 100 (A) 10/23/2018 1853   UROBILINOGEN 0.2 04/29/2015 2026   NITRITE NEGATIVE 10/23/2018 1853    LEUKOCYTESUR NEGATIVE 10/23/2018 1853   Sepsis Labs Invalid input(s): PROCALCITONIN,  WBC,  LACTICIDVEN Microbiology No results found for this or any previous visit (from the past 240 hour(s)).   Time coordinating discharge: 34 minutes  SIGNED:   Alwyn RenElizabeth G Annabel Gibeau, MD  [facility] 10/26/2018, 12:32 PM Pager   If 7PM-7AM, please contact night-coverage [email or web address] Password [email or web address]

## 2018-10-26 NOTE — Interval H&P Note (Signed)
History and Physical Interval Note:  10/26/2018 9:51 AM  Justin Clayton  has presented today for surgery, with the diagnosis of LEFT ATRIAL MASS  The various methods of treatment have been discussed with the patient and family. After consideration of risks, benefits and other options for treatment, the patient has consented to  Procedure(s): TRANSESOPHAGEAL ECHOCARDIOGRAM (TEE) (N/A) as a surgical intervention .  The patient's history has been reviewed, patient examined, no change in status, stable for surgery.  I have reviewed the patient's chart and labs.  Questions were answered to the patient's satisfaction.     [name]

## 2018-10-26 NOTE — Progress Notes (Signed)
  Echocardiogram Echocardiogram Transesophageal has been performed.  Justin Clayton 10/26/2018, 11:45 AM

## 2018-10-26 NOTE — Op Note (Signed)
INDICATIONS: left atrial mass  PROCEDURE:   Informed consent was obtained prior to the procedure. The risks, benefits and alternatives for the procedure were discussed and the patient comprehended these risks.  Risks include, but are not limited to, cough, sore throat, vomiting, nausea, somnolence, esophageal and stomach trauma or perforation, bleeding, low blood pressure, aspiration, pneumonia, infection, trauma to the teeth and death.    After a procedural time-out, the oropharynx was anesthetized with 20% benzocaine spray.   During this procedure the patient was administered a total of Versed 2 mg and Fentanyl 25 mcg to achieve and maintain moderate conscious sedation.  The patient's heart rate, blood pressure, and oxygen saturationweare monitored continuously during the procedure. The period of conscious sedation was 10 minutes, of which I was present face-to-face 100% of this time.  The transesophageal probe was inserted in the esophagus and stomach without difficulty and multiple views were obtained.  The patient was kept under observation until the patient left the procedure room.  The patient left the procedure room in stable condition.   Agitated microbubble saline contrast was not administered.  COMPLICATIONS:    There were no immediate complications.  FINDINGS:  Mild LVH, normal left ventricular size and function. Mild AI and mild MR. Otherwise normal study.  No evidence of a left atrial mass. The impression of a mass may have been an artifact related to a prominent "coumadin ridge" separating the appendage from the left upper pulmonary vein (normal variant).  RECOMMENDATIONS:    No further evaluation for intracariac mass is necessary.  Time Spent Directly with the Patient:  30 minutes   Anna Beaird 10/26/2018, 11:09 AM

## 2018-10-27 ENCOUNTER — Encounter (HOSPITAL_COMMUNITY): Payer: Self-pay | Admitting: Cardiovascular Disease

## 2018-10-30 ENCOUNTER — Other Ambulatory Visit: Payer: Self-pay | Admitting: *Deleted

## 2018-10-30 NOTE — Patient Outreach (Signed)
[facility]) Care Management  10/30/2018  Justin Clayton 02-28-1951 [id number]   EMMI-general discharge RED ON EMMI ALERT Day # 1  Date: Saturday 10/28/2018 1046  Red Alert Reason: Scheduled follow-up? No Insurance:  [employer] inpatient referral   [facility] admissions x 1 ED visits x 1in the last 6 months  Last admission 10/23/2018 to 10/26/2018 fall with concern of syncopal episode Pcp [facility] Diet recommendation: Cardiac Home Health none Equipment/Devices: None   Outreach attempt # 1 Male answered. She reports that the patient is not home [facility] RN CM left HIPAA compliant voicemail message along with CM's contact info.     Conditions: type 2 diabetes, hyperlipidemia, hypertension, hyperlipidemia, fall, asthma, constipation, orthostasis, left atrial mass   Plan: [facility] RN CM sent an unsuccessful outreach letter and scheduled this patient for another call attempt within 4 business days  Kimberly L. Noelle Penner, RN, BSN, CCM [facility] Telephonic Care Management Care Coordinator Office number [phone] Mobile number [phone]  Main [facility] number [phone] Fax number [phone]

## 2018-10-31 ENCOUNTER — Other Ambulatory Visit: Payer: Self-pay | Admitting: *Deleted

## 2018-10-31 NOTE — Patient Outreach (Addendum)
[facility]) Care Management  10/31/2018  Armeen Capulong [birth year] [id number]   EMMI-general discharge RED ON EMMI ALERT Day # 1  Date: Saturday 10/28/2018 1046  Red Alert Reason: Scheduled follow-up? No Insurance:  [employer] inpatient referral   [facility] admissions x 1 ED visits x 1 in the last 6 months  Last admission 10/23/2018 to 10/26/2018 fall with concern of syncopal episode [facility] Diet recommendation:Cardiac Home Healthnone Equipment/Devices:None   Outreach attempt # 2  Patient is able to verify HIPAA Reviewed and addressed EMMI red alert to [facility] with patient Mr Corcoran confirms the answer to the EMMI question is correct He reports he has not been able to get an appointment for f/u at the [facility] He reports issues with getting in contact with anyone He denies any further falls or syncope episodes since his discharge on 10/26/2018 He has all his medications, his cbgs have been lower at 180s vs 225-250  Confirms stopped metformin and taking lisinopril   [facility] RN CM called and spoke with Jasmine December at [facility] [place] to change Mr Keatts's appointment to 11/21/2018 at -730  [facility] RN CM updated Mr Yelinek and he was very Adult nurse   Social: Mr Moresi lives with his significant other, "girlfriend, Donalee Citrin  He is independent with his care needs and has transportation to medical appointments    Conditions: HTN, syncope, orthostasis, asthma, DM type , AKI, chest pain, debility, constipation, left atrial mass, thrombocytopenia, HLD  DME cane, eyeglasses, glucose meter   Medications: denies concerns with taking medications as prescribed, affording medications, side effects of medications and questions about medications Gets his medications from the [place] does not get flu shot   Appointments: seen in September 2019, was scheduled to be seen Q 6 months  He is aware of a December 19 2018 visit with Dr Jess Barters am CM assisted him to get  a Nov 21 2018 0730 appointment with Dr Jess Barters  Advance Directives: He confirms he has a living will, no changes at this time   Consent: [facility] RN CM reviewed [facility] services with patient. Patient gave verbal consent for services.    Plan: [facility] RN CM will close case at this time as patient has been assessed and no needs identified/needs resolved.   Pt encouraged to return a call to [facility] RN CM prn   Pa Tennant L. Noelle Penner, RN, BSN, CCM [facility] Telephonic Care Management Care Coordinator Office number [phone] Mobile number [phone]  Main [facility] number [phone] Fax number [phone]

## 2018-11-01 ENCOUNTER — Other Ambulatory Visit: Payer: Self-pay | Admitting: *Deleted

## 2018-11-01 NOTE — Patient Outreach (Signed)
[facility]) Care Management  11/01/2018  Justin Clayton August 12, 1951 [id number]   Care coordination for EMMI referral for   EMMI-general discharge RED ON EMMI ALERT Day # 4 Date: 11/01/2018 1046 Red Alert Reason: Scheduled follow-up? No Insurance: [employer] Medicare [facility] admissions x 1 ED visits x 1 in the last 6 months     EMMI issue resolved  [facility] RN CM assisted Mr Reason on 10/31/2018 1219 to obtain an earlier follow up visit with his primary care MD, Dr Jess Barters at the [facility] ([place]). He had a 6 month follow up in March but was having difficulty reaching staff at [facility] to get his appointment changed.  [facility] RN CM was able to reach staff at the [facility] to get the earliest appoint available for November 21 2018 at 0730  [name] agreed to this appointment date and time and was appreciative of assistance Further transition of care assessment completed without identified needs at this time   Plan [facility] RN CM will close case at this time as patient has been assessed and no needs identified and appointment needs resolved on 10/31/2018   Pt encouraged to return a call to [facility] RN CM prn  Danielly Ackerley L. Noelle Penner, RN, BSN, CCM [facility] Telephonic Care Management Care Coordinator Office number [phone] Mobile number [phone]  Main [facility] number [phone] Fax number [phone]

## 2019-12-11 ENCOUNTER — Other Ambulatory Visit: Payer: Self-pay

## 2019-12-11 ENCOUNTER — Encounter (HOSPITAL_COMMUNITY): Payer: Self-pay | Admitting: Emergency Medicine

## 2019-12-11 ENCOUNTER — Emergency Department (HOSPITAL_COMMUNITY): Payer: Medicare HMO

## 2019-12-11 ENCOUNTER — Emergency Department (HOSPITAL_COMMUNITY)
Admission: EM | Admit: 2019-12-11 | Discharge: 2019-12-11 | Disposition: A | Discharge status: Home / Self Care | Payer: Medicare HMO | Attending: Emergency Medicine | Admitting: Emergency Medicine

## 2019-12-11 DIAGNOSIS — D696 Thrombocytopenia, unspecified: Secondary | ICD-10-CM | POA: Diagnosis not present

## 2019-12-11 DIAGNOSIS — R200 Anesthesia of skin: Secondary | ICD-10-CM

## 2019-12-11 DIAGNOSIS — E119 Type 2 diabetes mellitus without complications: Secondary | ICD-10-CM | POA: Diagnosis not present

## 2019-12-11 DIAGNOSIS — Z7982 Long term (current) use of aspirin: Secondary | ICD-10-CM | POA: Diagnosis not present

## 2019-12-11 DIAGNOSIS — Z794 Long term (current) use of insulin: Secondary | ICD-10-CM | POA: Insufficient documentation

## 2019-12-11 DIAGNOSIS — F17200 Nicotine dependence, unspecified, uncomplicated: Secondary | ICD-10-CM | POA: Insufficient documentation

## 2019-12-11 DIAGNOSIS — J45909 Unspecified asthma, uncomplicated: Secondary | ICD-10-CM | POA: Diagnosis not present

## 2019-12-11 DIAGNOSIS — I10 Essential (primary) hypertension: Secondary | ICD-10-CM | POA: Diagnosis not present

## 2019-12-11 DIAGNOSIS — M21371 Foot drop, right foot: Secondary | ICD-10-CM

## 2019-12-11 LAB — COMPREHENSIVE METABOLIC PANEL
ALT: 28 U/L (ref 0–44)
AST: 43 U/L — ABNORMAL HIGH (ref 15–41)
Albumin: 3.7 g/dL (ref 3.5–5.0)
Alkaline Phosphatase: 42 U/L (ref 38–126)
Anion gap: 7 (ref 5–15)
BUN: 17 mg/dL (ref 8–23)
CO2: 26 mmol/L (ref 22–32)
Calcium: 9 mg/dL (ref 8.9–10.3)
Chloride: 108 mmol/L (ref 98–111)
Creatinine, Ser: 1.41 mg/dL — ABNORMAL HIGH (ref 0.61–1.24)
GFR calc Af Amer: 59 mL/min — ABNORMAL LOW (ref >60)
GFR calc non Af Amer: 51 mL/min — ABNORMAL LOW (ref >60)
Glucose, Bld: 116 mg/dL — ABNORMAL HIGH (ref 70–99)
Potassium: 4 mmol/L (ref 3.5–5.1)
Sodium: 141 mmol/L (ref 135–145)
Total Bilirubin: 0.7 mg/dL (ref 0.3–1.2)
Total Protein: 7.3 g/dL (ref 6.5–8.1)

## 2019-12-11 LAB — CBC WITH DIFFERENTIAL/PLATELET
Abs Immature Granulocytes: 0.01 10*3/uL (ref 0.00–0.07)
Basophils Absolute: 0 10*3/uL (ref 0.0–0.1)
Basophils Relative: 0 %
Eosinophils Absolute: 0.1 10*3/uL (ref 0.0–0.5)
Eosinophils Relative: 1 %
HCT: 34.7 % — ABNORMAL LOW (ref 39.0–52.0)
Hemoglobin: 11.5 g/dL — ABNORMAL LOW (ref 13.0–17.0)
Immature Granulocytes: 0 %
Lymphocytes Relative: 43 %
Lymphs Abs: 3 10*3/uL (ref 0.7–4.0)
MCH: 31.9 pg (ref 26.0–34.0)
MCHC: 33.1 g/dL (ref 30.0–36.0)
MCV: 96.4 fL (ref 80.0–100.0)
Monocytes Absolute: 0.5 10*3/uL (ref 0.1–1.0)
Monocytes Relative: 7 %
Neutro Abs: 3.3 10*3/uL (ref 1.7–7.7)
Neutrophils Relative %: 49 %
Platelets: 91 10*3/uL — ABNORMAL LOW (ref 150–400)
RBC: 3.6 MIL/uL — ABNORMAL LOW (ref 4.22–5.81)
RDW: 12.1 % (ref 11.5–15.5)
WBC: 6.9 10*3/uL (ref 4.0–10.5)
nRBC: 0 % (ref 0.0–0.2)

## 2019-12-11 LAB — CBG MONITORING, ED: Glucose-Capillary: 101 mg/dL — ABNORMAL HIGH (ref 70–99)

## 2019-12-11 MED ORDER — SODIUM CHLORIDE 0.9 % IV BOLUS
1000.0000 mL | Freq: Once | INTRAVENOUS | Status: AC
  Administered 2019-12-11: 1000 mL via INTRAVENOUS

## 2019-12-11 NOTE — ED Provider Notes (Signed)
[facility]-EMERGENCY DEPT Provider Note   CSN: [id number] Arrival date & time: 12/11/19  1123     History Chief Complaint  Patient presents with  . leg numbness    Justin PlumRichard Clayton is a 69 y.o. male.  HPI  Patient is a 69 year old male with a history of diabetes, high cholesterol, asthma presented today with complaints of right leg numbness, right foot burning and numbness, transient right foot drop.  Patient states that his thigh numbness started 1 week ago has been constant since that time.  He denies any aggravating or mitigating factors.  States that he has had no trauma.  Patient has no history of numbness extremities in the past.  He also endorses some numbness in his perineal area.  States that he cannot completely feel his testicles and perineal area between his anus and testicles.  He denies any bowel or bladder incontinence.  Denies any history of IV drug use, denies any blood thinners or history of cancer.  He states he has a history of chronic back pain after his time of service however no trauma or fractures in the past.   Patient states he has been able to ambulate without difficulty.  He denies any fevers or chills.  No weakness in his legs other than the transient 2 days of right foot drop that he experienced 2 days ago and yesterday but states that it resolved by this morning.  He denies any history of strokes or seizure.     Past Medical History:  Diagnosis Date  . Asthma   . Diabetes mellitus   . High cholesterol     Patient Active Problem List   Diagnosis Date Noted  . Left atrial mass   . Orthostasis 10/24/2018  . Constipation   . Syncope 10/23/2018  . Thrombocytopenia (HCC) 10/23/2018  . AKI (acute kidney injury) (HCC) 10/23/2018  . Debility 10/23/2018  . Chest pain 02/28/2017  . HLD (hyperlipidemia) 02/28/2017  . DM2 (diabetes mellitus, type 2) (HCC) 02/28/2017  . Essential hypertension 02/28/2017  . Asthma 02/28/2017     Past Surgical History:  Procedure Laterality Date  . FRACTURE SURGERY Right    R leg  . ROTATOR CUFF REPAIR Bilateral   . TEE WITHOUT CARDIOVERSION N/A 10/26/2018   Procedure: TRANSESOPHAGEAL ECHOCARDIOGRAM (TEE);  Surgeon: Thurmon Fairroitoru, Mihai, MD;  [facility];  Service: Cardiovascular;  Laterality: N/A;       Family History  Problem Relation Age of Onset  . Cancer Mother   . Alcohol abuse Father   . CAD Sister   . Diabetes Neg Hx   . Stroke Neg Hx     Social History   Tobacco Use  . Smoking status: Current Some Day Smoker  . Smokeless tobacco: Never Used  Substance Use Topics  . Alcohol use: No  . Drug use: No    Home Medications Prior to Admission medications   Medication Sig Start Date End Date Taking? Authorizing Provider  acetaminophen (TYLENOL) 325 MG tablet Take 650 mg by mouth daily as needed for moderate pain.    Yes [provider]  acyclovir (ZOVIRAX) 800 MG tablet Take 400 mg by mouth daily. Taking 1/2 of 800mg    Yes [provider]  albuterol (PROVENTIL HFA;VENTOLIN HFA) 108 (90 BASE) MCG/ACT inhaler Inhale 2 puffs into the lungs every 4 (four) hours as needed. For shortness of breath   Yes [provider]  aspirin EC 81 MG tablet Take 81 mg by mouth daily.  Yes [provider]  atorvastatin (LIPITOR) 80 MG tablet Take 80 mg by mouth every evening.    Yes [provider]  Cholecalciferol (VITAMIN D-3) 1000 UNITS CAPS Take 1 capsule by mouth daily.    Yes [provider]  Colloidal Oatmeal (GOLD BOND ECZEMA RELIEF) 2 % CREA Apply 1 application topically as needed (dry skin).   Yes [provider]  docusate sodium (COLACE) 100 MG capsule Take 100 mg by mouth 2 (two) times daily.   Yes [provider]  fish oil-omega-3 fatty acids 1000 MG capsule Take 1 g by mouth 2 (two) times daily.    Yes [provider]  gabapentin (NEURONTIN) 300 MG capsule Take 300 mg by mouth 2  (two) times daily.    Yes [provider]  glipiZIDE (GLUCOTROL) 10 MG tablet Take 10 mg by mouth 2 (two) times daily before a meal.   Yes [provider]  insulin glargine (LANTUS) 100 units/mL SOLN Inject 32 Units into the skin daily at 10 pm.    Yes [provider]  lisinopril (PRINIVIL,ZESTRIL) 40 MG tablet Take 0.5 tablets (20 mg total) by mouth daily. Patient taking differently: Take 40 mg by mouth daily.  10/25/18  Yes Alwyn Ren, MD  nicotine (NICODERM CQ - DOSED IN MG/24 HR) 7 mg/24hr patch Place 7 mg onto the skin daily as needed (nicotine addiction).   Yes [provider]  pantoprazole (PROTONIX) 40 MG tablet Take 40 mg by mouth daily.   Yes [provider]  terazosin (HYTRIN) 5 MG capsule Take 5 mg by mouth at bedtime.   Yes [provider]    Allergies    Patient has no known allergies.  Review of Systems   Review of Systems  Constitutional: Negative for chills, fatigue and fever.  HENT: Negative for congestion.   Eyes: Negative for pain.  Respiratory: Negative for cough and shortness of breath.   Cardiovascular: Negative for chest pain and leg swelling.  Gastrointestinal: Negative for abdominal pain, diarrhea, nausea and vomiting.  Genitourinary: Negative for dysuria.  Musculoskeletal: Negative for myalgias.  Skin: Negative for rash.  Neurological: Positive for weakness (right foot) and numbness (right thigh, right foot). Negative for dizziness and headaches.    Physical Exam Updated Vital Signs BP (!) 154/82   Pulse 69   Temp 98.2 F (36.8 C) (Oral)   Resp 16   SpO2 100%   Physical Exam Vitals and nursing note reviewed.  Constitutional:      General: He is not in acute distress. HENT:     Head: Normocephalic and atraumatic.     Nose: Nose normal.     Mouth/Throat:     Mouth: Mucous membranes are moist.  Eyes:     General: No scleral icterus. Cardiovascular:     Rate and Rhythm: Normal rate  and regular rhythm.     Pulses: Normal pulses.     Heart sounds: Normal heart sounds.  Pulmonary:     Effort: Pulmonary effort is normal. No respiratory distress.     Breath sounds: No wheezing.  Abdominal:     Palpations: Abdomen is soft.     Tenderness: There is no abdominal tenderness.  Musculoskeletal:     Cervical back: Normal range of motion.     Right lower leg: No edema.     Left lower leg: No edema.  Skin:    General: Skin is warm and dry.     Capillary Refill: Capillary refill  takes less than 2 seconds.  Neurological:     Mental Status: He is alert. Mental status is at baseline.     Comments: Alert and oriented to self, place, time and event.   Speech is fluent, clear without dysarthria or dysphasia.   Strength 5/5 in upper/lower extremities  Patient versus decreased sensation in his plantar aspects of the MTPs of his foot however still has sharp dull differentiation in this area.  Similar perceived a decrease in sensation in his right proximal anterior thigh however sharp and dull differentiation is still intact.  Normal gait.  Patient ambulated around room several times.  He has normal gait.  No hesitancy.  Is able to stand with eyes closed.  Negative Romberg negative pronator drift. Normal finger-to-nose and feet tapping.  CN I not tested  CN II grossly intact visual fields bilaterally. Did not visualize posterior eye.   CN III, IV, VI PERRLA and EOMs intact bilaterally  CN V Intact sensation to sharp and light touch to the face  CN VII facial movements symmetric  CN VIII not tested  CN IX, X no uvula deviation, symmetric rise of soft palate  CN XI 5/5 SCM and trapezius strength bilaterally  CN XII Midline tongue protrusion, symmetric L/R movements   Psychiatric:        Mood and Affect: Mood normal.        Behavior: Behavior normal.     ED Results / Procedures / Treatments   Labs (all labs ordered are listed, but only abnormal results are displayed) Labs  Reviewed  COMPREHENSIVE METABOLIC PANEL - Abnormal; Notable for the following components:      Result Value   Glucose, Bld 116 (*)    Creatinine, Ser 1.41 (*)    AST 43 (*)    GFR calc non Af Amer 51 (*)    GFR calc Af Amer 59 (*)    All other components within normal limits  CBC WITH DIFFERENTIAL/PLATELET - Abnormal; Notable for the following components:   RBC 3.60 (*)    Hemoglobin 11.5 (*)    HCT 34.7 (*)    Platelets 91 (*)    All other components within normal limits  CBG MONITORING, ED - Abnormal; Notable for the following components:   Glucose-Capillary 101 (*)    All other components within normal limits    EKG None  Radiology DG Cervical Spine Complete  Result Date: 12/11/2019 CLINICAL DATA:  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 Decreased sensation between the legs, transient right foot drop and tenderness palpation of the cervical spine. EXAM: LUMBAR SPINE - COMPLETE 4+ VIEW; CERVICAL SPINE - COMPLETE 4+ VIEW; THORACIC SPINE 2 VIEWS COMPARISON:  October 23, 2018. FINDINGS: Five non rib-bearing lumbar vertebral bodies with sacralization of L5 and mild lumbosacral dextrocurvature apical at L4. Mild right sacroiliitis. No spondylolysis. No evidence of lumbar, spine fracture. Alignment is normal. Lumbar intervertebral disc space is mildly decreased at L4-5, and the remaining levels are maintained. Mild facet arthropathy at the right thoracolumbar junction and L3-S1 levels. Reversal of the cervical lordosis. Mild dextrocurvature of the cervicothoracic spine apical at T6. Degenerative mild anterior wedging of C2, C5 and C6. Diffuse cervical disc space narrowing mild from C2 to C5 obliterated at C5-6 and severely narrowed C6 to T1 with bulky endplate degenerative changes. Surgical repair anchors of a bicipital tendon in the lateral view. Minimal degenerative endplate changes in the midthoracic spine. Normal prevertebral soft tissues. Nuchal calcification at  the C5 level. Moderate osseous neural foraminal narrowing at the left C4-5 and C6-7 levels and right C6-7 and C7-T1 level. IMPRESSION: No acute fracture or malalignment of the cervical, thoracic, or lumbar spine. Multilevel degenerative disc disease of the spine most pronounced in the cervical region, with obliteration of the C5-6 disc space and cervical muscle spasm. Right-sided mild sacroiliitis, due at least in part to the mild S-shaped spinal curvature. L5-S1 sacralization, mild L4-5 disc space narrowing, and mild lumbar facet arthropathy. 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 Decreased sensation between the legs, transient right foot drop and tenderness palpation of the cervical spine. EXAM: LUMBAR SPINE - COMPLETE 4+ VIEW; CERVICAL SPINE - COMPLETE 4+ VIEW; THORACIC SPINE 2 VIEWS COMPARISON:  October 23, 2018. FINDINGS: Five non rib-bearing lumbar vertebral bodies with sacralization of L5 and mild lumbosacral dextrocurvature apical at L4. Mild right sacroiliitis. No spondylolysis. No evidence of lumbar, spine fracture. Alignment is normal. Lumbar intervertebral disc space is mildly decreased at L4-5, and the remaining levels are maintained. Mild facet arthropathy at the right thoracolumbar junction and L3-S1 levels. Reversal of the cervical lordosis. Mild dextrocurvature of the cervicothoracic spine apical at T6. Degenerative mild anterior wedging of C2, C5 and C6. Diffuse cervical disc space narrowing mild from C2 to C5 obliterated at C5-6 and severely narrowed C6 to T1 with bulky endplate degenerative changes. Surgical repair anchors of a bicipital tendon in the lateral view. Minimal degenerative endplate changes in the midthoracic spine. Normal prevertebral soft tissues. Nuchal calcification at the C5 level. Moderate osseous neural foraminal  narrowing at the left C4-5 and C6-7 levels and right C6-7 and C7-T1 level. IMPRESSION: No acute fracture or malalignment of the cervical, thoracic, or lumbar spine. Multilevel degenerative disc disease of the spine most pronounced in the cervical region, with obliteration of the C5-6 disc space and cervical muscle spasm. Right-sided mild sacroiliitis, due at least in part to the mild S-shaped spinal curvature. L5-S1 sacralization, mild L4-5 disc space narrowing, and mild lumbar facet arthropathy. 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 Decreased sensation between the legs, transient right foot drop and tenderness palpation of the cervical spine. EXAM: LUMBAR SPINE - COMPLETE 4+ VIEW; CERVICAL SPINE - COMPLETE 4+ VIEW; THORACIC SPINE 2 VIEWS COMPARISON:  October 23, 2018. FINDINGS: Five non rib-bearing lumbar vertebral bodies with sacralization of L5 and mild lumbosacral dextrocurvature apical at L4. Mild right sacroiliitis. No spondylolysis. No evidence of lumbar, spine fracture. Alignment is normal. Lumbar intervertebral disc space is mildly decreased at L4-5, and the remaining levels are maintained. Mild facet arthropathy at the right thoracolumbar junction and L3-S1 levels. Reversal of the cervical lordosis. Mild dextrocurvature of the cervicothoracic spine apical at T6. Degenerative mild anterior wedging of C2, C5 and C6. Diffuse cervical disc space narrowing mild from C2 to C5 obliterated at C5-6 and severely narrowed C6 to T1 with bulky endplate degenerative changes. Surgical repair anchors of a bicipital tendon in the lateral view. Minimal degenerative endplate changes in the midthoracic spine. Normal prevertebral soft tissues. Nuchal calcification at the C5 level. Moderate osseous neural foraminal narrowing at the left C4-5 and C6-7 levels and  right C6-7 and  C7-T1 level. IMPRESSION: No acute fracture or malalignment of the cervical, thoracic, or lumbar spine. Multilevel degenerative disc disease of the spine most pronounced in the cervical region, with obliteration of the C5-6 disc space and cervical muscle spasm. Right-sided mild sacroiliitis, due at least in part to the mild S-shaped spinal curvature. L5-S1 sacralization, mild L4-5 disc space narrowing, and mild lumbar facet arthropathy. Electronically Signed   By: Revonda Humphrey   On: 12/11/2019 15:31   MR LUMBAR SPINE WO CONTRAST  Result Date: 12/11/2019 CLINICAL DATA:  Low back pain. Cauda equina syndrome. Foot drop. Right leg numbness. Symptoms began last week. EXAM: MRI LUMBAR SPINE WITHOUT CONTRAST TECHNIQUE: Multiplanar, multisequence MR imaging of the lumbar spine was performed. No intravenous contrast was administered. COMPARISON:  Radiography same day.  MRI report 01/08/1999. FINDINGS: Segmentation: Correlating with the previous studies, L5 was described as a transitional vertebra. I will continue to utilize that numbering terminology. Alignment:  Normal Vertebrae:  No fracture or primary bone lesion. Conus medullaris and cauda equina: Conus extends to the L1 level. Conus and cauda equina appear normal. Paraspinal and other soft tissues: Negative Disc levels: T12-L1: Normal. L1-2: Minimal desiccation and bulging of the disc. No stenosis. Facet osteoarthritis with mild edema on the left. L2-3: Minimal desiccation and bulging of the disc. No stenosis. Facet osteoarthritis with mild edema on the left. L3-4: Desiccation and mild bulging of the disc. Mild facet and ligamentous hypertrophy with some edema. Mild stenosis of both lateral recesses. Definite neural compression not demonstrated. L4-5: Disc degeneration with bulging more prominent towards the left. Facet degeneration and hypertrophy left more pronounced than right, with some edema. There is stenosis of the lateral recesses,  particularly on the left. There is potential that the left L5 nerve could be compressed. The facet arthropathy could certainly be a cause of back pain or referred facet syndrome pain. L5-S1: Transitional level.  Wide patency of the canal and foramina. IMPRESSION: L5-S1 is a transitional level as described previously. L4-5: Disc bulge more prominent towards the left. Facet degeneration and hypertrophy more prominent on the left. Lateral recess stenosis worse on the left. Potential for compression of the left L5 nerve in the lateral recess. The facet arthropathy shows edematous change, worse on the left, and could be associated with low back pain or facet syndrome pain. Disc bulges and facet degeneration at L1-2, L2-3 and L3-4. No apparent compressive stenosis. These findings could contribute to back pain and, in the case of the facet arthritis, referred facet syndrome pain. Electronically Signed   By: Nelson Chimes M.D.   On: 12/11/2019 18:25    Procedures Procedures (including critical care time)  Medications Ordered in ED Medications  sodium chloride 0.9 % bolus 1,000 mL (0 mLs Intravenous Stopped 12/11/19 2045)    ED Course  I have reviewed the triage vital signs and the nursing notes.  Pertinent labs & imaging results that were available during my care of the patient were reviewed by me and considered in my medical decision making (see chart for details).  Clinical Course as of Dec 11 2155  Tue Dec 11, 2019  1954 MR LUMBAR SPINE WO CONTRAST [WF]  2155 Creatinine mildly elevated but at baseline per patient.  No other acute abnormalities on CMP.  Creatinine(!): 1.41 [WF]  2156 CBC with stable anemia.  No acute abnormalities.  Platelets somewhat low however this is patient's baseline.  He will follow up with PCP about this issue.  CBC with Differential(!) [WF]  Clinical Course User Index [WF] Gailen Shelter, [place]   MDM Rules/Calculators/A&P                      Patient is 69 year old  gentleman with a diabetes, high cholesterol, asthma presented today with complaints of right leg numbness, right foot burning and numbness, transient right foot drop.  His symptoms have been ongoing for 1 week in regards to the thigh numbness.   Given presentation low suspicion for central process.  This patient does endorse decrease in station and perineum will obtain MR lumbar spine to evaluate for cauda equina.   Given that patient has no objective decrease in station and no neurologic dysfunction on exam low suspicion for cauda equina syndrome.  We will initiate work-up with complete spinal x-rays. Which revealed No acute fracture or malalignment of the C/T/L spine. Multilevel degenerative disc disease of the spine most pronounced in the cervical region, with obliteration of the C5-6 disc space and cervical muscle spasm.   I discussed this case with my attending physician who cosigned this note including patient's presenting symptoms, physical exam, and planned diagnostics and interventions. Attending physician stated agreement with plan or made changes to plan which were implemented.   Attending physician assessed patient at bedside.   Plan to obtain MRI.  MRI shows degenerative disc disease.  There is leftward protrusion of L5 disc.  However patient has no radicular symptoms in left side.  Results of the MRI reviewed at 6:50 PM.  I discussed the results with Dr. Freida Busman my attending physician at this time who recommended orthopedic follow-up.  Discharge at this time.  I reassessed patient at 6:55 PM explained results of lumbar MRI.  He is agreeable to discharge at this time and states that he will follow up with his primary care doctor and possibly with orthopedic surgeon.  Vital signs are within normal limits apart from mild hypertension.   Patient will follow up with his primary care doctor to reassess his hypertension.  Final Clinical Impression(s) / ED Diagnoses Final diagnoses:   Right foot drop  Leg numbness    Rx / DC Orders ED Discharge Orders    None       Gailen Shelter, [place] 12/11/19 2158    Pricilla Loveless, MD 12/16/19 1504

## 2019-12-11 NOTE — ED Notes (Signed)
Pt wife called to pick pt up. Pt A/O and ambulatory at discharge. PT verbalized discharge instructions and denies any questions.

## 2019-12-11 NOTE — ED Triage Notes (Signed)
Pt c/o right leg numbness that started last week. reports been doing some exercises.

## 2019-12-11 NOTE — ED Notes (Signed)
Labs not sent down by this RN. Found on bedside table by this RN. Will redraw.

## 2019-12-11 NOTE — Discharge Instructions (Addendum)
Please call [facility] to make a follow-up appointment to evaluate you for your discriminations and back pain.  Your lab work was reassuring and unremarkable today.

## 2020-11-24 ENCOUNTER — Encounter (HOSPITAL_COMMUNITY): Payer: Self-pay | Admitting: Emergency Medicine

## 2020-11-24 ENCOUNTER — Emergency Department (HOSPITAL_BASED_OUTPATIENT_CLINIC_OR_DEPARTMENT_OTHER): Payer: Medicare HMO

## 2020-11-24 ENCOUNTER — Emergency Department (HOSPITAL_COMMUNITY): Payer: Medicare HMO

## 2020-11-24 ENCOUNTER — Other Ambulatory Visit: Payer: Self-pay

## 2020-11-24 ENCOUNTER — Emergency Department (HOSPITAL_COMMUNITY)
Admission: EM | Admit: 2020-11-24 | Discharge: 2020-11-24 | Disposition: A | Discharge status: Home / Self Care | Payer: Medicare HMO | Attending: Emergency Medicine | Admitting: Emergency Medicine

## 2020-11-24 DIAGNOSIS — I10 Essential (primary) hypertension: Secondary | ICD-10-CM | POA: Insufficient documentation

## 2020-11-24 DIAGNOSIS — E119 Type 2 diabetes mellitus without complications: Secondary | ICD-10-CM | POA: Diagnosis not present

## 2020-11-24 DIAGNOSIS — M79604 Pain in right leg: Secondary | ICD-10-CM

## 2020-11-24 DIAGNOSIS — J45909 Unspecified asthma, uncomplicated: Secondary | ICD-10-CM | POA: Insufficient documentation

## 2020-11-24 DIAGNOSIS — M79651 Pain in right thigh: Secondary | ICD-10-CM | POA: Insufficient documentation

## 2020-11-24 DIAGNOSIS — F17200 Nicotine dependence, unspecified, uncomplicated: Secondary | ICD-10-CM | POA: Insufficient documentation

## 2020-11-24 MED ORDER — DICLOFENAC SODIUM 1 % EX GEL: 2.0000 g | Freq: Four times a day (QID) | CUTANEOUS | 0 refills | Status: DC

## 2020-11-24 NOTE — ED Provider Notes (Signed)
Emergency Department Provider Note   I have reviewed the triage vital signs and the nursing notes.   HISTORY  Chief Complaint Leg Pain   HPI Justin Clayton is a 70 y.o. male with past medical history reviewed below presents the emergency department with acute onset pain in the back of his right thigh.  Patient was at home, resting on the couch, when symptoms began.  He denies any obvious injury.  He notes intermittent severe pain in the back of the calf and became concerned because he has a history of DVT.  He is no longer anticoagulated.  He is not feeling shortness of breath or chest pain.  No pain radiating down the leg or into the lower back.  No pain at the top of the buttock. He has not taken any medication PTA.   Past Medical History:  Diagnosis Date  . Asthma   . Diabetes mellitus   . High cholesterol     Patient Active Problem List   Diagnosis Date Noted  . Left atrial mass   . Orthostasis 10/24/2018  . Constipation   . Syncope 10/23/2018  . Thrombocytopenia (HCC) 10/23/2018  . AKI (acute kidney injury) (HCC) 10/23/2018  . Debility 10/23/2018  . Chest pain 02/28/2017  . HLD (hyperlipidemia) 02/28/2017  . DM2 (diabetes mellitus, type 2) (HCC) 02/28/2017  . Essential hypertension 02/28/2017  . Asthma 02/28/2017    Past Surgical History:  Procedure Laterality Date  . FRACTURE SURGERY Right    R leg  . ROTATOR CUFF REPAIR Bilateral   . TEE WITHOUT CARDIOVERSION N/A 10/26/2018   Procedure: TRANSESOPHAGEAL ECHOCARDIOGRAM (TEE);  Surgeon: Thurmon Fair, MD;  Location: [facility] ENDOSCOPY;  Service: Cardiovascular;  Laterality: N/A;    Allergies Patient has no known allergies.  Family History  Problem Relation Age of Onset  . Cancer Mother   . Alcohol abuse Father   . CAD Sister   . Diabetes Neg Hx   . Stroke Neg Hx     Social History Social History   Tobacco Use  . Smoking status: Current Some Day Smoker  . Smokeless tobacco: Never Used  Substance  Use Topics  . Alcohol use: No  . Drug use: No    Review of Systems  Constitutional: No fever/chills Eyes: No visual changes. ENT: No sore throat. Cardiovascular: Denies chest pain. Respiratory: Denies shortness of breath. Gastrointestinal: No abdominal pain.  No nausea, no vomiting.  No diarrhea.  No constipation. Genitourinary: Negative for dysuria. Musculoskeletal: Negative for back pain. Positive right leg pain.  Skin: Negative for rash. Neurological: Negative for headaches, focal weakness or numbness.  10-point ROS otherwise negative.  ____________________________________________   PHYSICAL EXAM:  VITAL SIGNS: ED Triage Vitals  Enc Vitals Group     BP 11/24/20 1742 (!) 155/95     Pulse Rate 11/24/20 1742 (!) 102     Resp 11/24/20 1742 16     Temp 11/24/20 1742 98.6 F (37 C)     Temp Source 11/24/20 1742 Oral     SpO2 11/24/20 1742 99 %   Constitutional: Alert and oriented. Well appearing and in no acute distress. Eyes: Conjunctivae are normal.  Head: Atraumatic. Nose: No congestion/rhinnorhea. Mouth/Throat: Mucous membranes are moist.   Neck: No stridor.   Cardiovascular: Normal rate, regular rhythm. Good peripheral circulation. Grossly normal heart sounds.   Respiratory: Normal respiratory effort.  No retractions. Lungs CTAB. Gastrointestinal: No distention.  Musculoskeletal: No lower extremity tenderness nor edema. No gross deformities of extremities. No  joint swelling. Normal ROM. Tenderness to palpation along the posterior thigh. No palpable masses.  Neurologic:  Normal speech and language.  Skin:  Skin is warm, dry and intact. No rash noted.  ____________________________________________  RADIOLOGY  DG Hip Unilat W or Wo Pelvis 2-3 Views Right  Result Date: 11/24/2020 CLINICAL DATA:  Chronic right hip pain with recent worsening EXAM: DG HIP (WITH OR WITHOUT PELVIS) 2-3V RIGHT COMPARISON:  None. FINDINGS: Frontal view of the pelvis as well as frontal and  frogleg lateral views of the right hip are obtained. No fracture, subluxation, or dislocation. Mild symmetrical bilateral hip osteoarthritis. Sacroiliac joints are normal. Hemi sacralization of the L5 vertebral body on the left. IMPRESSION: 1. Mild symmetrical hip osteoarthritis.  No acute fracture. Electronically Signed   By: Sharlet Salina M.D.   On: 11/24/2020 21:24   VAS [place] LOWER EXTREMITY VENOUS (DVT) (ONLY [facility] & [facility])  Result Date: 11/24/2020  Lower Venous DVT Study Indications: Pain.  Risk Factors: DVT DVT 2018. Comparison Study: Prev 2018 Positive for DVT Performing Technologist: Clint Guy RVT  Examination Guidelines: A complete evaluation includes B-mode imaging, spectral Doppler, color Doppler, and power Doppler as needed of all accessible portions of each vessel. Bilateral testing is considered an integral part of a complete examination. Limited examinations for reoccurring indications may be performed as noted. The reflux portion of the exam is performed with the patient in reverse Trendelenburg.  +---------+---------------+---------+-----------+----------+--------------+ RIGHT    CompressibilityPhasicitySpontaneityPropertiesThrombus Aging +---------+---------------+---------+-----------+----------+--------------+ CFV      Full           Yes      Yes                                 +---------+---------------+---------+-----------+----------+--------------+ SFJ      Full                                                        +---------+---------------+---------+-----------+----------+--------------+ FV Prox  Full                                                        +---------+---------------+---------+-----------+----------+--------------+ FV Mid   Full                                                        +---------+---------------+---------+-----------+----------+--------------+ FV DistalFull                                                         +---------+---------------+---------+-----------+----------+--------------+ PFV      Full                                                        +---------+---------------+---------+-----------+----------+--------------+  POP      Full           Yes      Yes                                 +---------+---------------+---------+-----------+----------+--------------+ PTV      Full                                                        +---------+---------------+---------+-----------+----------+--------------+ PERO     Full                                                        +---------+---------------+---------+-----------+----------+--------------+  Summary: RIGHT: - There is no evidence of deep vein thrombosis in the lower extremity.  - No cystic structure found in the popliteal fossa.   *See table(s) above for measurements and observations.    Preliminary     ____________________________________________   PROCEDURES  Procedure(s) performed:   Procedures  None  ____________________________________________   INITIAL IMPRESSION / ASSESSMENT AND PLAN / ED COURSE  Pertinent labs & imaging results that were available during my care of the patient were reviewed by me and considered in my medical decision making (see chart for details).   Patient presents to the emergency department for evaluation of acute onset posterior right thigh pain.  No palpable masses.  Normal pulses in the leg.  No mechanism to suspect injury.  Plan for plain film of the right hip.  DVT ultrasound from triage negative. Clinically seems most consistent with muscle spasm. Will r/o obvious bony lesion causing pain.   Plain film without abnormality. Patient ambulatory here and more comfortable. Plan for OTC medication and topical meds at home with close PCP follow up.  ____________________________________________  FINAL CLINICAL IMPRESSION(S) / ED DIAGNOSES  Final diagnoses:  Right leg pain     NEW OUTPATIENT MEDICATIONS STARTED DURING THIS VISIT:  Discharge Medication List as of 11/24/2020 10:02 PM    START taking these medications   Details  diclofenac Sodium (VOLTAREN) 1 % GEL Apply 2 g topically 4 (four) times daily., Starting Mon 11/24/2020, Print        Note:  This document was prepared using Dragon voice recognition software and may include unintentional dictation errors.  Alona Bene, MD, [facility] Emergency Medicine    Kazoua Gossen, Arlyss Repress, MD 11/25/20 [phone]

## 2020-11-24 NOTE — ED Triage Notes (Signed)
Pt reports pain in right upper thigh since last night. Pt has hx of DVT. Not taking blood thinners currently. Pt ambulatory.

## 2020-11-24 NOTE — Discharge Instructions (Signed)

## 2020-11-24 NOTE — Progress Notes (Signed)
Right lower extremity venous study completed.     Please see CV Proc for preliminary results.   Lisa Gibson, RVT  

## 2021-11-12 IMAGING — CR DG LUMBAR SPINE COMPLETE 4+V
5 series · 5 of 5 positions shown · non-contrast
Comparison: October 23, 2018.

CLINICAL DATA: Former paratrooper with right lateral thigh numbness
the past week. Decreased sensation between the legs, transient right
foot drop and tenderness palpation of the cervical spine.

EXAM:
LUMBAR SPINE - COMPLETE 4+ VIEW; CERVICAL SPINE - COMPLETE 4+ VIEW;
THORACIC SPINE 2 VIEWS

[t lumbar spine ap]
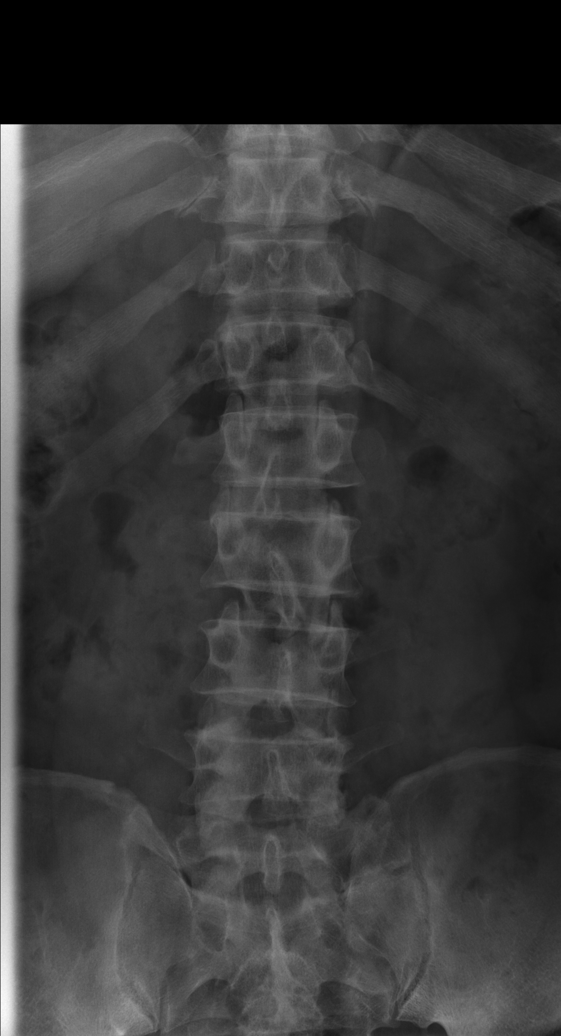

[t lumbar spine obl (1 of 2)]
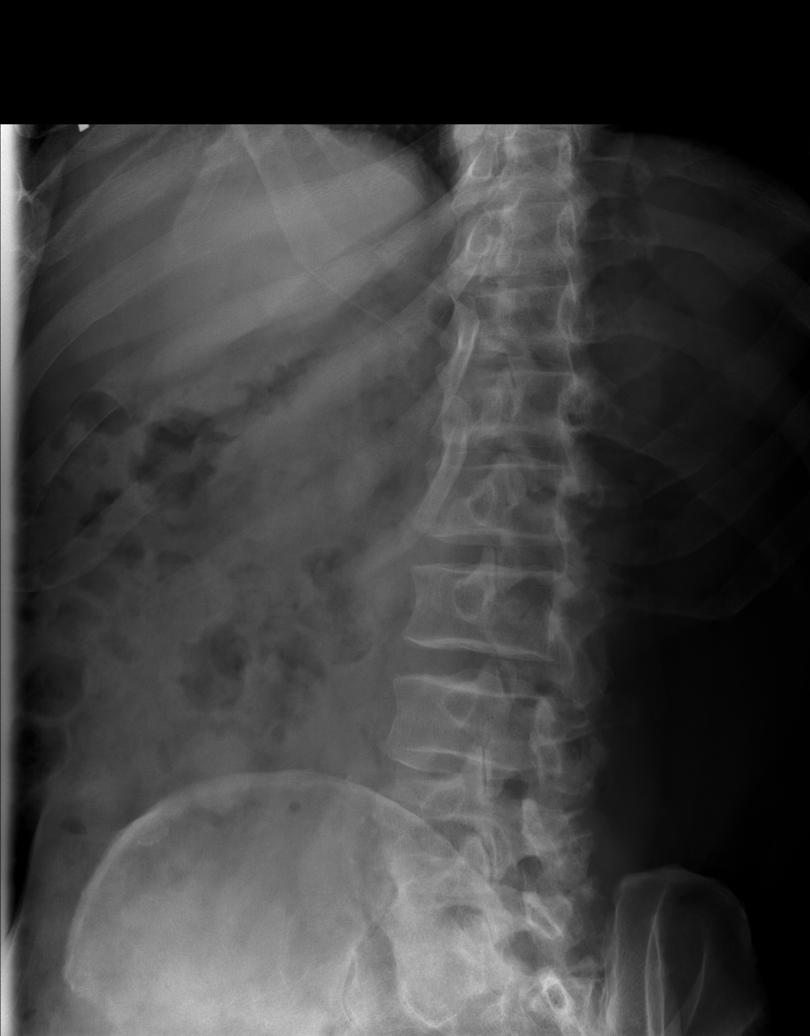

[t lumbar spine obl (2 of 2)]
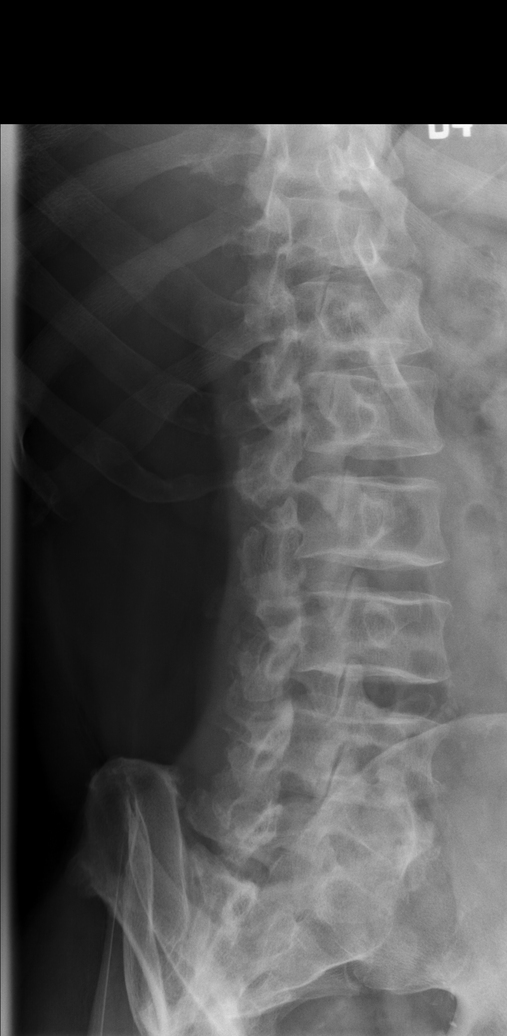

[t lumbar spine lat]
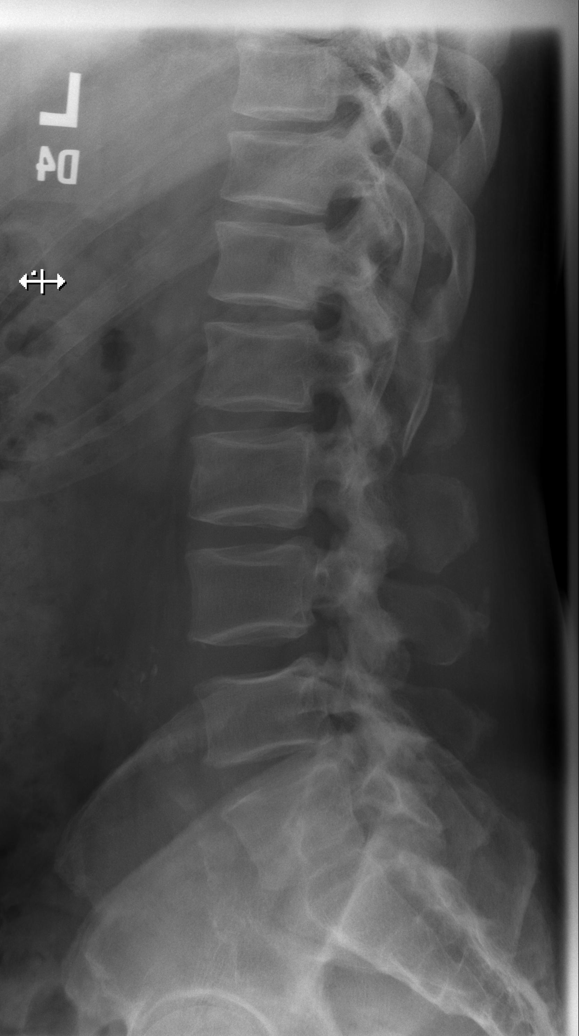

[t lumbar l-5 s-1 spot]
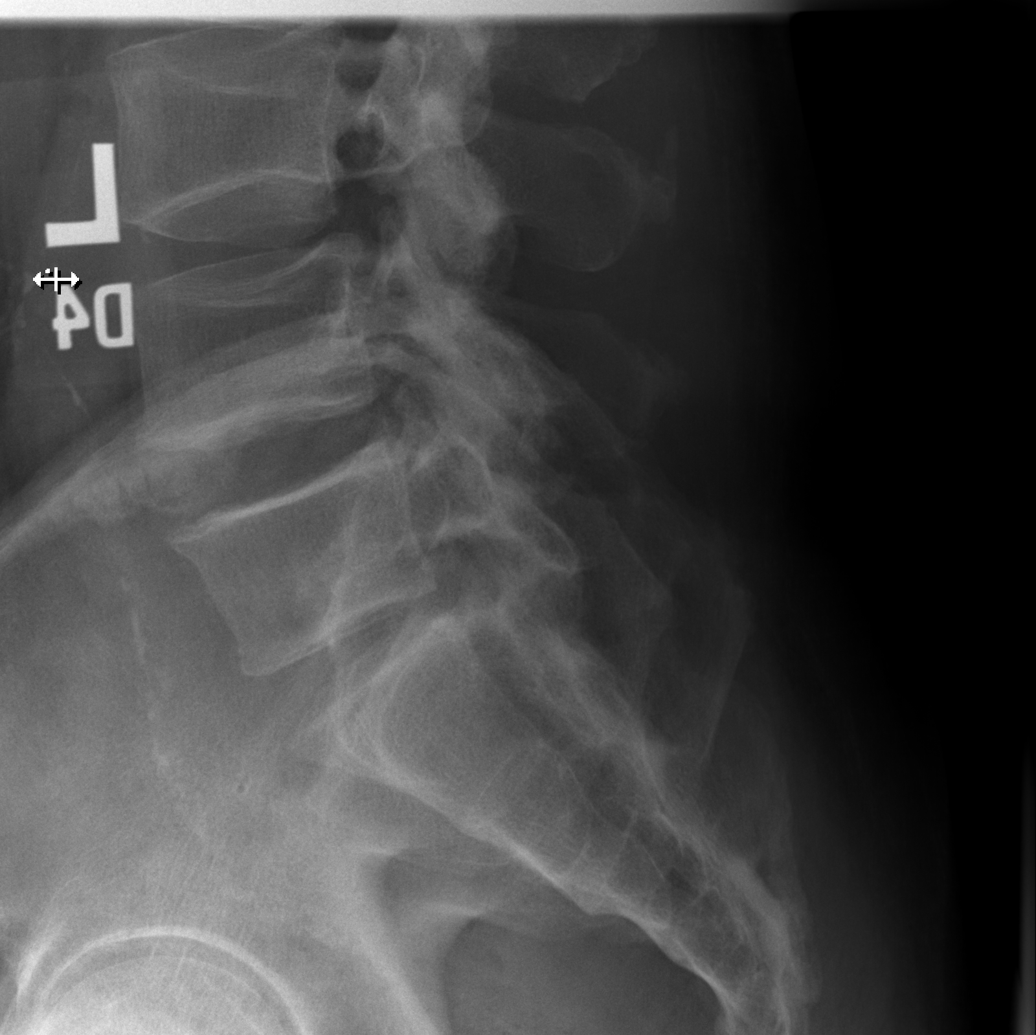

[5 of 5 positions shown; findings below may reference images not displayed]

FINDINGS: Five non rib-bearing lumbar vertebral bodies with sacralization of
L5 and mild lumbosacral dextrocurvature apical at L4. Mild right
sacroiliitis. No spondylolysis. No evidence of lumbar, spine
fracture. Alignment is normal. Lumbar intervertebral disc space is
mildly decreased at L4-5, and the remaining levels are maintained.
Mild facet arthropathy at the right thoracolumbar junction and L3-S1
levels.

Reversal of the cervical lordosis. Mild dextrocurvature of the
cervicothoracic spine apical at T6. Degenerative mild anterior
wedging of C2, C5 and C6. Diffuse cervical disc space narrowing mild
from C2 to C5 obliterated at C5-6 and severely narrowed C6 to T1
with bulky endplate degenerative changes. Surgical repair anchors of
a bicipital tendon in the lateral view. Minimal degenerative
endplate changes in the midthoracic spine. Normal prevertebral soft
tissues. Nuchal calcification at the C5 level. Moderate osseous
neural foraminal narrowing at the left C4-5 and C6-7 levels and
right C6-7 and C7-T1 level.
IMPRESSION: No acute fracture or malalignment of the cervical, thoracic, or
lumbar spine.

Multilevel degenerative disc disease of the spine most pronounced in
the cervical region, with obliteration of the C5-6 disc space and
cervical muscle spasm.

Right-sided mild sacroiliitis, due at least in part to the mild
S-shaped spinal curvature.

L5-S1 sacralization, mild L4-5 disc space narrowing, and mild lumbar
facet arthropathy.

## 2022-07-05 ENCOUNTER — Other Ambulatory Visit: Payer: Self-pay

## 2022-07-05 ENCOUNTER — Emergency Department (HOSPITAL_COMMUNITY)
Admission: EM | Admit: 2022-07-05 | Discharge: 2022-07-05 | Disposition: A | Discharge status: Home / Self Care | Payer: No Typology Code available for payment source | Attending: Emergency Medicine | Admitting: Emergency Medicine

## 2022-07-05 ENCOUNTER — Emergency Department (HOSPITAL_COMMUNITY): Payer: No Typology Code available for payment source

## 2022-07-05 ENCOUNTER — Encounter (HOSPITAL_COMMUNITY): Payer: Self-pay

## 2022-07-05 DIAGNOSIS — Z7982 Long term (current) use of aspirin: Secondary | ICD-10-CM | POA: Insufficient documentation

## 2022-07-05 DIAGNOSIS — R739 Hyperglycemia, unspecified: Secondary | ICD-10-CM | POA: Diagnosis not present

## 2022-07-05 DIAGNOSIS — Z794 Long term (current) use of insulin: Secondary | ICD-10-CM | POA: Diagnosis not present

## 2022-07-05 DIAGNOSIS — R1013 Epigastric pain: Secondary | ICD-10-CM | POA: Insufficient documentation

## 2022-07-05 LAB — HEPATIC FUNCTION PANEL
ALT: 19 U/L (ref 0–44)
AST: 25 U/L (ref 15–41)
Albumin: 3.5 g/dL (ref 3.5–5.0)
Alkaline Phosphatase: 40 U/L (ref 38–126)
Bilirubin, Direct: 0.2 mg/dL (ref 0.0–0.2)
Indirect Bilirubin: 0.4 mg/dL (ref 0.3–0.9)
Total Bilirubin: 0.6 mg/dL (ref 0.3–1.2)
Total Protein: 7.4 g/dL (ref 6.5–8.1)

## 2022-07-05 LAB — CBC
HCT: 37.2 % — ABNORMAL LOW (ref 39.0–52.0)
Hemoglobin: 12.7 g/dL — ABNORMAL LOW (ref 13.0–17.0)
MCH: 33.2 pg (ref 26.0–34.0)
MCHC: 34.1 g/dL (ref 30.0–36.0)
MCV: 97.4 fL (ref 80.0–100.0)
Platelets: 85 10*3/uL — ABNORMAL LOW (ref 150–400)
RBC: 3.82 MIL/uL — ABNORMAL LOW (ref 4.22–5.81)
RDW: 13.4 % (ref 11.5–15.5)
WBC: 6.5 10*3/uL (ref 4.0–10.5)
nRBC: 0 % (ref 0.0–0.2)

## 2022-07-05 LAB — URINALYSIS, ROUTINE W REFLEX MICROSCOPIC
Bilirubin Urine: NEGATIVE
Glucose, UA: 50 mg/dL — AB
Hgb urine dipstick: NEGATIVE
Ketones, ur: NEGATIVE mg/dL
Leukocytes,Ua: NEGATIVE
Nitrite: NEGATIVE
Protein, ur: 30 mg/dL — AB
Specific Gravity, Urine: 1.028 (ref 1.005–1.030)
pH: 5 (ref 5.0–8.0)

## 2022-07-05 LAB — I-STAT CHEM 8, ED
BUN: 24 mg/dL — ABNORMAL HIGH (ref 8–23)
Calcium, Ion: 1.16 mmol/L (ref 1.15–1.40)
Chloride: 109 mmol/L (ref 98–111)
Creatinine, Ser: 1.3 mg/dL — ABNORMAL HIGH (ref 0.61–1.24)
Glucose, Bld: 225 mg/dL — ABNORMAL HIGH (ref 70–99)
HCT: 38 % — ABNORMAL LOW (ref 39.0–52.0)
Hemoglobin: 12.9 g/dL — ABNORMAL LOW (ref 13.0–17.0)
Potassium: 4.1 mmol/L (ref 3.5–5.1)
Sodium: 143 mmol/L (ref 135–145)
TCO2: 22 mmol/L (ref 22–32)

## 2022-07-05 LAB — BASIC METABOLIC PANEL
Anion gap: 7 (ref 5–15)
BUN: 22 mg/dL (ref 8–23)
CO2: 23 mmol/L (ref 22–32)
Calcium: 8.9 mg/dL (ref 8.9–10.3)
Chloride: 110 mmol/L (ref 98–111)
Creatinine, Ser: 1.34 mg/dL — ABNORMAL HIGH (ref 0.61–1.24)
GFR, Estimated: 57 mL/min — ABNORMAL LOW (ref >60)
Glucose, Bld: 230 mg/dL — ABNORMAL HIGH (ref 70–99)
Potassium: 4.2 mmol/L (ref 3.5–5.1)
Sodium: 140 mmol/L (ref 135–145)

## 2022-07-05 LAB — TROPONIN I (HIGH SENSITIVITY)
Troponin I (High Sensitivity): 16 ng/L (ref <18)
Troponin I (High Sensitivity): 13 ng/L (ref <18)

## 2022-07-05 LAB — CBG MONITORING, ED: Glucose-Capillary: 225 mg/dL — ABNORMAL HIGH (ref 70–99)

## 2022-07-05 LAB — LIPASE, BLOOD: Lipase: 40 U/L (ref 11–51)

## 2022-07-05 MED ORDER — ALUM & MAG HYDROXIDE-SIMETH 200-200-20 MG/5ML PO SUSP
30.0000 mL | Freq: Once | ORAL | Status: AC
  Administered 2022-07-05: 30 mL via ORAL
  Filled 2022-07-05: qty 30

## 2022-07-05 MED ORDER — IOHEXOL 300 MG/ML  SOLN
100.0000 mL | Freq: Once | INTRAMUSCULAR | Status: AC | PRN
  Administered 2022-07-05: 100 mL via INTRAVENOUS

## 2022-07-05 MED ORDER — SODIUM CHLORIDE (PF) 0.9 % IJ SOLN
INTRAMUSCULAR | Status: AC
  Filled 2022-07-05: qty 50

## 2022-07-05 NOTE — ED Triage Notes (Addendum)
Patient reports that he had a CBG-55 last night and this AM it was 263 at 0800. Patient states he took his insulin this AM.  Patienat c/o abdominal pain that he rates 7/10.

## 2022-07-05 NOTE — ED Notes (Signed)
Patient transported to CT 

## 2022-07-05 NOTE — ED Notes (Signed)
XR at bedside

## 2022-07-05 NOTE — ED Notes (Signed)
Patient provided with urinal. Aware urine sample is needed. 

## 2022-07-05 NOTE — ED Provider Notes (Signed)
[facility]-EMERGENCY DEPT Provider Note   CSN: [id number] Arrival date & time: 07/05/22  0844     History  Chief Complaint  Patient presents with   Hyperglycemia    Justin Clayton is a 71 y.o. male.  71 yo M with a chief complaints of epigastric discomfort.  He noticed this when he woke up this morning.  He has been very concerned about his blood sugar.  That he took it this morning it was higher than it should be.  Was over 300.  He checked it again and so although did come down a little bit but that was bothering him enough to come to the ED for evaluation.  He tells me he has had trouble with his blood sugar in the past.  He is experiencing some epigastric discomfort that feels like he had too much to eat.  Has not had anything yet this morning.  He denies cough congestion or fever.  Tells me that yesterday he had no issues but his blood sugar was slightly elevated.  He denies prior history of abdominal surgery.  Denies urinary symptoms.   Hyperglycemia      Home Medications Prior to Admission medications   Medication Sig Start Date End Date Taking? Authorizing Provider  acetaminophen (TYLENOL) 325 MG tablet Take 650 mg by mouth daily as needed for moderate pain.     [provider]  acyclovir (ZOVIRAX) 800 MG tablet Take 400 mg by mouth daily. Taking 1/2 of 800mg     [provider]  albuterol (PROVENTIL HFA;VENTOLIN HFA) 108 (90 BASE) MCG/ACT inhaler Inhale 2 puffs into the lungs every 4 (four) hours as needed. For shortness of breath    [provider]  aspirin EC 81 MG tablet Take 81 mg by mouth daily.    [provider]  atorvastatin (LIPITOR) 80 MG tablet Take 80 mg by mouth every evening.     [provider]  Cholecalciferol (VITAMIN D-3) 1000 UNITS CAPS Take 1 capsule by mouth daily.     [provider]  Colloidal Oatmeal (GOLD BOND ECZEMA RELIEF) 2 % CREA Apply 1 application topically as needed  (dry skin).    [provider]  diclofenac Sodium (VOLTAREN) 1 % GEL Apply 2 g topically 4 (four) times daily. 11/24/20   Long, 11/26/20, MD  docusate sodium (COLACE) 100 MG capsule Take 100 mg by mouth 2 (two) times daily.    [provider]  fish oil-omega-3 fatty acids 1000 MG capsule Take 1 g by mouth 2 (two) times daily.     [provider]  gabapentin (NEURONTIN) 300 MG capsule Take 300 mg by mouth 2 (two) times daily.     [provider]  glipiZIDE (GLUCOTROL) 10 MG tablet Take 10 mg by mouth 2 (two) times daily before a meal.    [provider]  insulin glargine (LANTUS) 100 units/mL SOLN Inject 32 Units into the skin daily at 10 pm.     [provider]  lisinopril (PRINIVIL,ZESTRIL) 40 MG tablet Take 0.5 tablets (20 mg total) by mouth daily. Patient taking differently: Take 40 mg by mouth daily.  10/25/18   10/27/18, MD  nicotine (NICODERM CQ - DOSED IN MG/24 HR) 7 mg/24hr patch Place 7 mg onto the skin daily as needed (nicotine addiction).    [provider]  pantoprazole (PROTONIX) 40 MG tablet Take 40 mg by mouth daily.    [provider]  terazosin (HYTRIN) 5  MG capsule Take 5 mg by mouth at bedtime.    [provider]      Allergies    Patient has no known allergies.    Review of Systems   Review of Systems  Physical Exam Updated Vital Signs BP (!) 137/95   Pulse 72   Temp 98.3 F (36.8 C)   Resp 20   Ht 6\' 1"  (1.854 m)   Wt 99.3 kg   SpO2 100%   BMI 28.89 kg/m  Physical Exam Vitals and nursing note reviewed.  Constitutional:      Appearance: He is well-developed.  HENT:     Head: Normocephalic and atraumatic.  Eyes:     Pupils: Pupils are equal, round, and reactive to light.  Neck:     Vascular: No JVD.  Cardiovascular:     Rate and Rhythm: Normal rate and regular rhythm.     Heart sounds: No murmur heard.    No friction rub. No gallop.  Pulmonary:     Effort: No  respiratory distress.     Breath sounds: No wheezing.  Abdominal:     General: There is no distension.     Tenderness: There is no abdominal tenderness. There is no guarding or rebound.     Comments: No obvious pain on palpation of the abdomen but says the when I push in the epigastrium is where it hurts.  No appreciable right upper quadrant pain.  Negative Murphy sign.  Musculoskeletal:        General: Normal range of motion.     Cervical back: Normal range of motion and neck supple.  Skin:    Coloration: Skin is not pale.     Findings: No rash.  Neurological:     Mental Status: He is alert and oriented to person, place, and time.  Psychiatric:        Behavior: Behavior normal.     ED Results / Procedures / Treatments   Labs (all labs ordered are listed, but only abnormal results are displayed) Labs Reviewed  BASIC METABOLIC PANEL - Abnormal; Notable for the following components:      Result Value   Glucose, Bld 230 (*)    Creatinine, Ser 1.34 (*)    GFR, Estimated 57 (*)    All other components within normal limits  CBC - Abnormal; Notable for the following components:   RBC 3.82 (*)    Hemoglobin 12.7 (*)    HCT 37.2 (*)    Platelets 85 (*)    All other components within normal limits  URINALYSIS, ROUTINE W REFLEX MICROSCOPIC - Abnormal; Notable for the following components:   Glucose, UA 50 (*)    Protein, ur 30 (*)    Bacteria, UA RARE (*)    All other components within normal limits  CBG MONITORING, ED - Abnormal; Notable for the following components:   Glucose-Capillary 225 (*)    All other components within normal limits  I-STAT CHEM 8, ED - Abnormal; Notable for the following components:   BUN 24 (*)    Creatinine, Ser 1.30 (*)    Glucose, Bld 225 (*)    Hemoglobin 12.9 (*)    HCT 38.0 (*)    All other components within normal limits  HEPATIC FUNCTION PANEL  LIPASE, BLOOD  CBG MONITORING, ED  TROPONIN I (HIGH SENSITIVITY)  TROPONIN I (HIGH SENSITIVITY)     EKG EKG Interpretation  Date/Time:  Monday July 05 2022 09:15:47 EDT Ventricular Rate:  103  PR Interval:  141 QRS Duration: 90 QT Interval:  348 QTC Calculation: 456 R Axis:   86 Text Interpretation: Sinus tachycardia Probable left atrial enlargement Borderline right axis deviation Abnormal inferior Q waves Borderline T wave abnormalities No significant change since last tracing Confirmed by Deno Etienne [phone]) on 07/05/2022 9:24:33 AM  Radiology CT ABDOMEN PELVIS W CONTRAST  Result Date: 07/05/2022 CLINICAL DATA:  Abdominal pain EXAM: CT ABDOMEN AND PELVIS WITH CONTRAST TECHNIQUE: Multidetector CT imaging of the abdomen and pelvis was performed using the standard protocol following bolus administration of intravenous contrast. RADIATION DOSE REDUCTION: This exam was performed according to the departmental dose-optimization program which includes automated exposure control, adjustment of the mA and/or kV according to patient size and/or use of iterative reconstruction technique. CONTRAST:  170mL OMNIPAQUE IOHEXOL 300 MG/ML  SOLN COMPARISON:  CT abdomen/pelvis 04/30/2015 FINDINGS: Lower chest: The lung bases are clear. The imaged heart is unremarkable. Hepatobiliary: The liver and gallbladder are unremarkable. There is no biliary ductal dilatation. Pancreas: Unremarkable. Spleen: Unremarkable. Adrenals/Urinary Tract: The adrenals are unremarkable. The kidneys enhance symmetrically. A right upper pole cyst is unchanged since 2016, requiring no specific imaging follow-up. There are no other focal lesions. There are no stones. There is no hydronephrosis or hydroureter. There is symmetric excretion of contrast into the collecting systems on the delayed images. The bladder is unremarkable. Stomach/Bowel: The stomach is unremarkable. There is no evidence of bowel obstruction. There is no abnormal bowel wall thickening or inflammatory change. There are scattered right colonic diverticuli without  evidence of acute diverticulitis. The appendix is normal. Vascular/Lymphatic: There is scattered calcified atherosclerotic plaque in the nonaneurysmal abdominal aorta. The major branch vessels are patent. The main portal and splenic veins are patent. Incidental note is made of a retroaortic left renal vein. There is no abdominopelvic lymphadenopathy. Reproductive: The prostate and seminal vesicles are unremarkable. Other: There is no ascites or free air. There is a small fat containing umbilical hernia, unchanged. Musculoskeletal: There is no acute osseous abnormality or suspicious osseous lesion. IMPRESSION: No acute finding in the abdomen or pelvis to explain the patient's pain. Electronically Signed   By: Valetta Mole M.D.   On: 07/05/2022 11:25   DG Chest Port 1 View  Result Date: 07/05/2022 CLINICAL DATA:  Epigastric pain. EXAM: PORTABLE CHEST 1 VIEW COMPARISON:  Chest x-ray 10/23/2018. FINDINGS: The heart size and mediastinal contours are within normal limits. Both lungs are clear. No visible pleural effusions or pneumothorax. No acute osseous abnormality. IMPRESSION: No active disease. Electronically Signed   By: Margaretha Sheffield M.D.   On: 07/05/2022 09:12    Procedures Procedures    Medications Ordered in ED Medications  alum & mag hydroxide-simeth (MAALOX/MYLANTA) 200-200-20 MG/5ML suspension 30 mL (30 mLs Oral Given 07/05/22 0924)  iohexol (OMNIPAQUE) 300 MG/ML solution 100 mL (100 mLs Intravenous Contrast Given 07/05/22 1109)  sodium chloride (PF) 0.9 % injection (  Given by Other 07/05/22 1104)    ED Course/ Medical Decision Making/ A&P                           Medical Decision Making Amount and/or Complexity of Data Reviewed Labs: ordered. Radiology: ordered. ECG/medicine tests: ordered.  Risk OTC drugs. Prescription drug management.   71 yo M with a chief complaints of epigastric discomfort.  This started this morning.  He is also been having trouble with his blood sugar  this morning.  We will obtain laboratory evaluation  including liver function test and lipase.  Dose of Maalox.  Troponin EKG chest x-ray.  Likely CT imaging of troponins negative to evaluate for intra-abdominal pathology.  2 troponins are negative.  UA negative for infection.  Will discharge patient home.  PCP follow-up.  3:30 PM:  I have discussed the diagnosis/risks/treatment options with the patient and family.  Evaluation and diagnostic testing in the emergency department does not suggest an emergent condition requiring admission or immediate intervention beyond what has been performed at this time.  They will follow up with PCP. We also discussed returning to the ED immediately if new or worsening sx occur. We discussed the sx which are most concerning (e.g., sudden worsening pain, fever, inability to tolerate by mouth) that necessitate immediate return. Medications administered to the patient during their visit and any new prescriptions provided to the patient are listed below.  Medications given during this visit Medications  alum & mag hydroxide-simeth (MAALOX/MYLANTA) 200-200-20 MG/5ML suspension 30 mL (30 mLs Oral Given 07/05/22 0924)  iohexol (OMNIPAQUE) 300 MG/ML solution 100 mL (100 mLs Intravenous Contrast Given 07/05/22 1109)  sodium chloride (PF) 0.9 % injection (  Given by Other 07/05/22 1104)     The patient appears reasonably screen and/or stabilized for discharge and I doubt any other medical condition or other [facility] requiring further screening, evaluation, or treatment in the ED at this time prior to discharge.         Final Clinical Impression(s) / ED Diagnoses Final diagnoses:  Epigastric abdominal pain    Rx / DC Orders ED Discharge Orders     None         Melene Plan, DO 07/05/22 1530

## 2022-07-05 NOTE — Discharge Instructions (Signed)
Try pepcid or tagamet up to twice a day.  Try to avoid things that may make this worse, most commonly these are spicy foods tomato based products fatty foods chocolate and peppermint.  Alcohol and tobacco can also make this worse.  Return to the emergency department for sudden worsening pain fever or inability to eat or drink.  

## 2022-10-27 IMAGING — DX DG HIP (WITH OR WITHOUT PELVIS) 2-3V*R*
3 series · 3 of 3 positions shown · non-contrast
Comparison: None.

CLINICAL DATA: Chronic right hip pain with recent worsening

EXAM:
DG HIP (WITH OR WITHOUT PELVIS) 2-3V RIGHT

[pelvis ap]
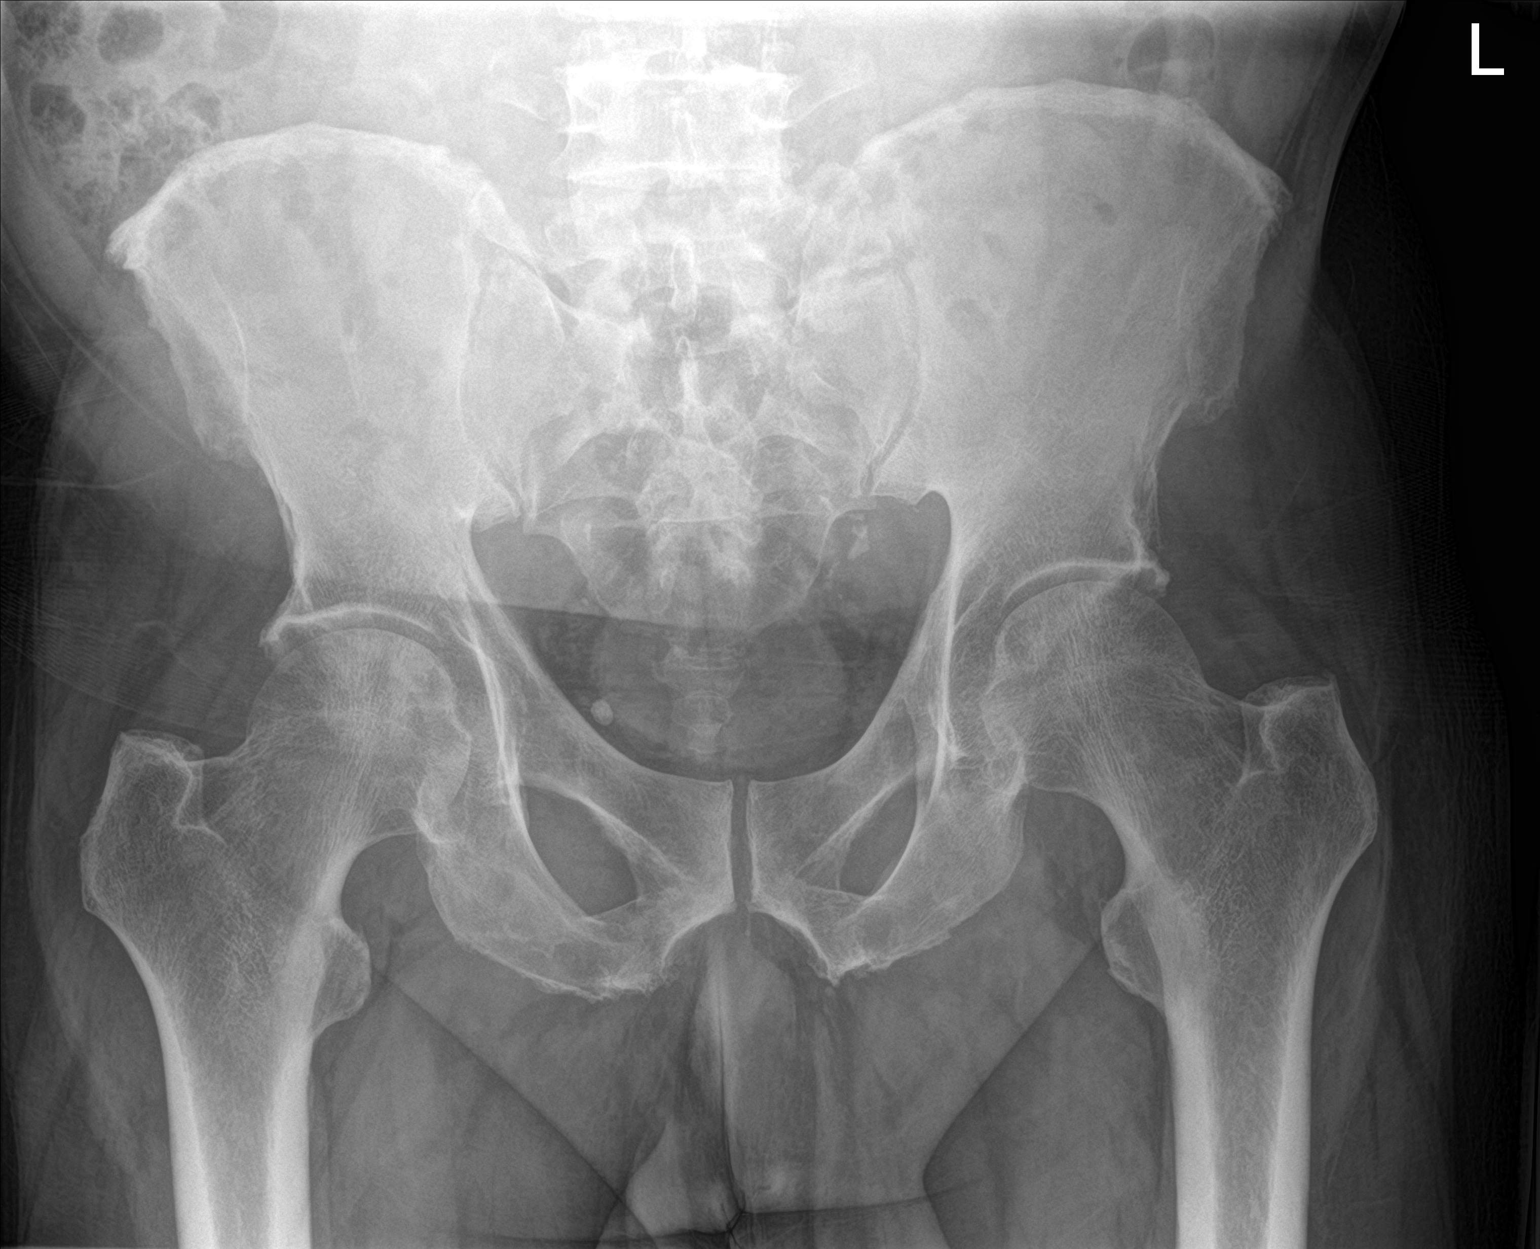

[hip ap]
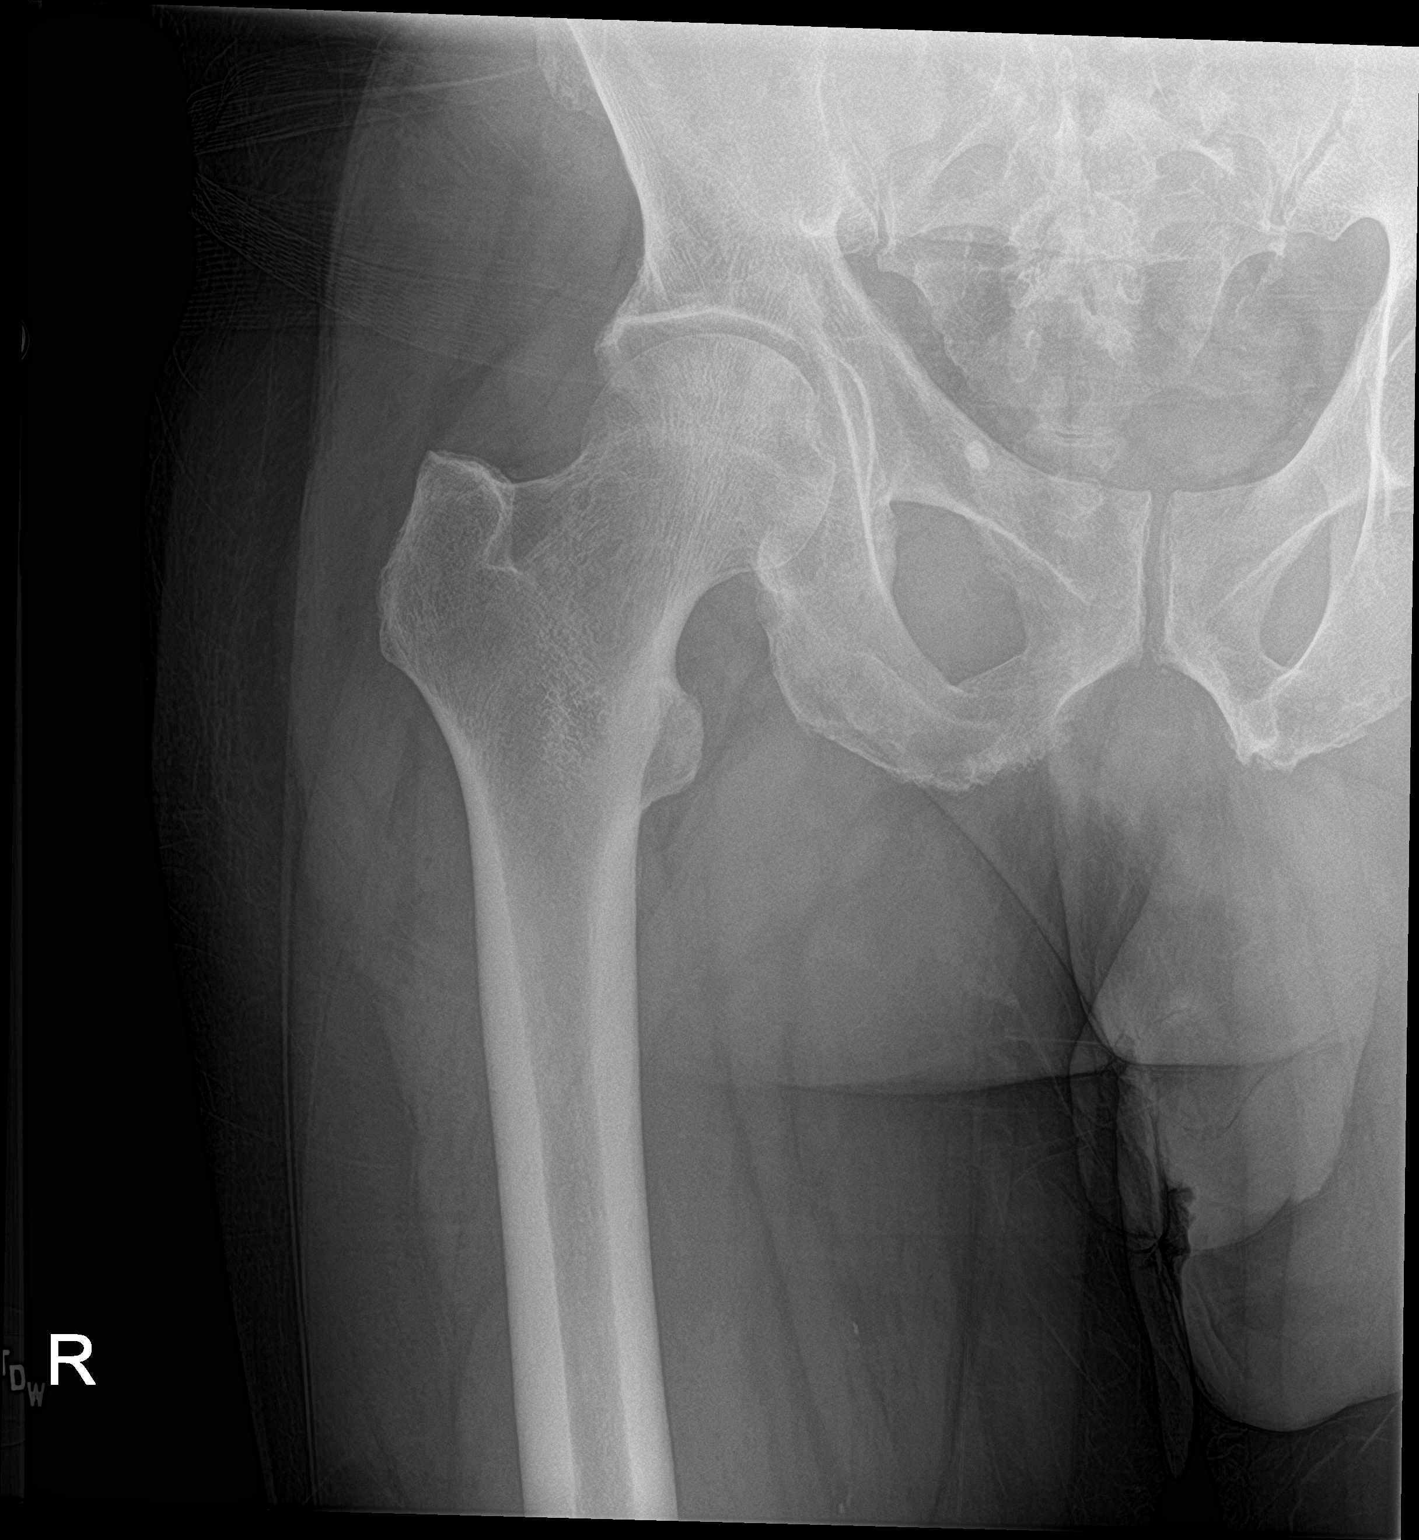

[hip lat]
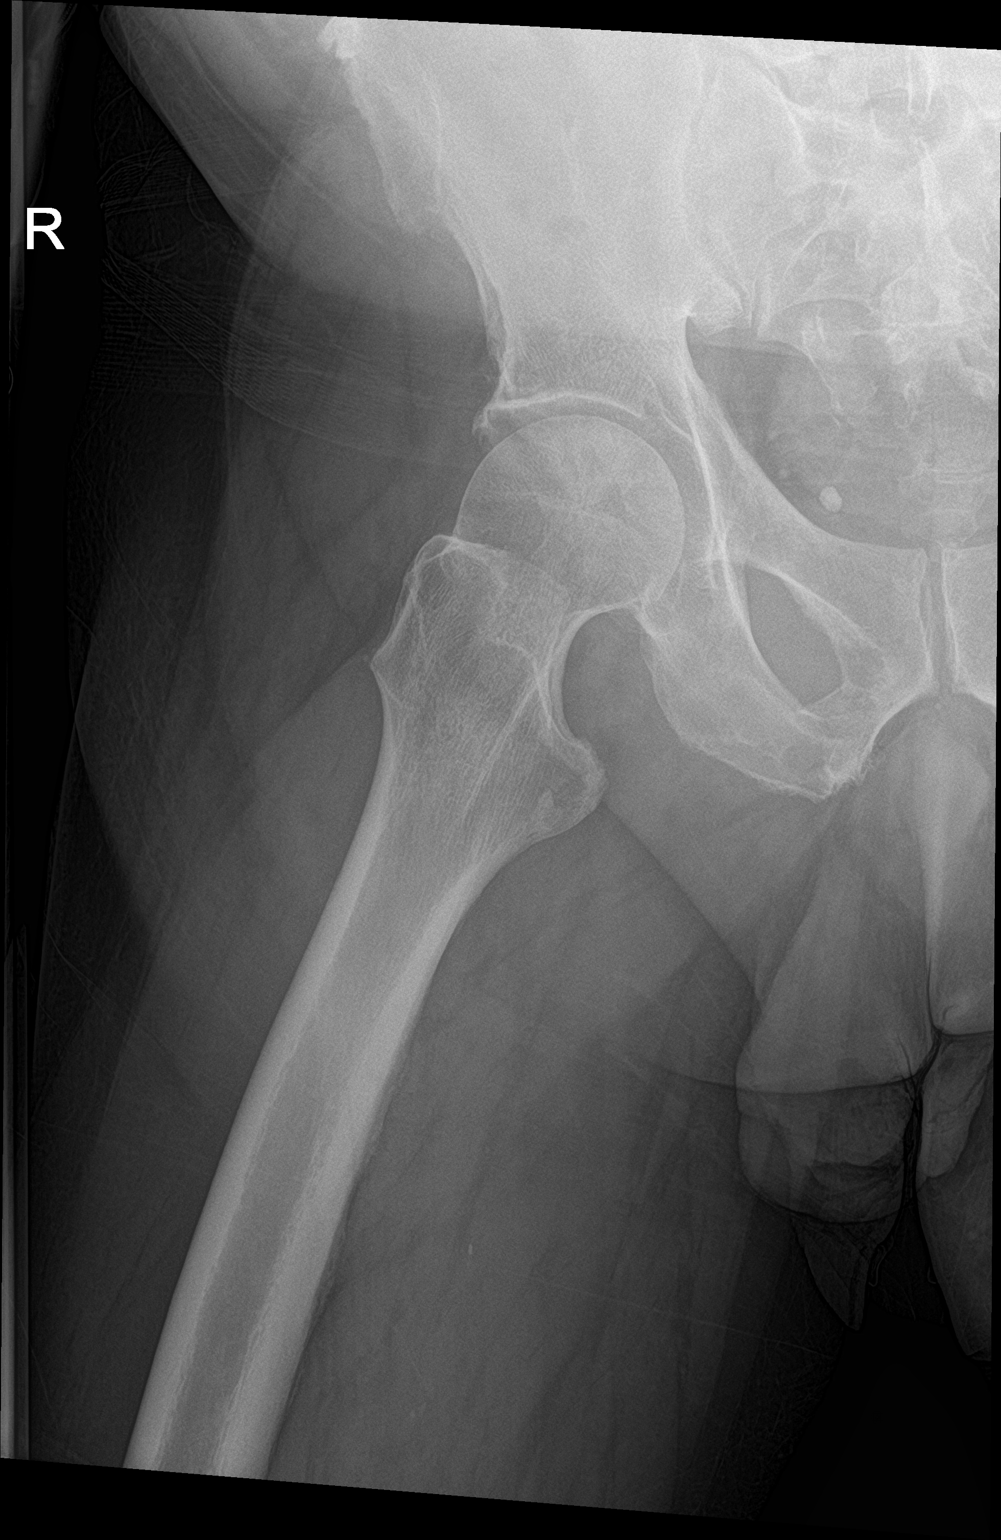

[3 of 3 positions shown; findings below may reference images not displayed]

FINDINGS: Frontal view of the pelvis as well as frontal and frogleg lateral
views of the right hip are obtained. No fracture, subluxation, or
dislocation. Mild symmetrical bilateral hip osteoarthritis.
Sacroiliac joints are normal. Hemi sacralization of the L5 vertebral
body on the left.
IMPRESSION: 1. Mild symmetrical hip osteoarthritis.  No acute fracture.

## 2022-11-23 ENCOUNTER — Emergency Department (HOSPITAL_COMMUNITY): Payer: No Typology Code available for payment source

## 2022-11-23 ENCOUNTER — Other Ambulatory Visit: Payer: Self-pay

## 2022-11-23 ENCOUNTER — Emergency Department (HOSPITAL_COMMUNITY)
Admission: EM | Admit: 2022-11-23 | Discharge: 2022-11-23 | Disposition: A | Discharge status: Home / Self Care | Payer: No Typology Code available for payment source | Attending: Emergency Medicine | Admitting: Emergency Medicine

## 2022-11-23 DIAGNOSIS — M8448XA Pathological fracture, other site, initial encounter for fracture: Secondary | ICD-10-CM | POA: Insufficient documentation

## 2022-11-23 DIAGNOSIS — I10 Essential (primary) hypertension: Secondary | ICD-10-CM | POA: Diagnosis not present

## 2022-11-23 DIAGNOSIS — E119 Type 2 diabetes mellitus without complications: Secondary | ICD-10-CM | POA: Insufficient documentation

## 2022-11-23 DIAGNOSIS — Z7984 Long term (current) use of oral hypoglycemic drugs: Secondary | ICD-10-CM | POA: Insufficient documentation

## 2022-11-23 DIAGNOSIS — Z79899 Other long term (current) drug therapy: Secondary | ICD-10-CM | POA: Insufficient documentation

## 2022-11-23 DIAGNOSIS — M4856XA Collapsed vertebra, not elsewhere classified, lumbar region, initial encounter for fracture: Secondary | ICD-10-CM

## 2022-11-23 DIAGNOSIS — R55 Syncope and collapse: Secondary | ICD-10-CM | POA: Diagnosis present

## 2022-11-23 DIAGNOSIS — W19XXXA Unspecified fall, initial encounter: Secondary | ICD-10-CM | POA: Insufficient documentation

## 2022-11-23 DIAGNOSIS — Z7982 Long term (current) use of aspirin: Secondary | ICD-10-CM | POA: Insufficient documentation

## 2022-11-23 DIAGNOSIS — Z794 Long term (current) use of insulin: Secondary | ICD-10-CM | POA: Diagnosis not present

## 2022-11-23 LAB — COMPREHENSIVE METABOLIC PANEL
ALT: 14 U/L (ref 0–44)
AST: 19 U/L (ref 15–41)
Albumin: 3.3 g/dL — ABNORMAL LOW (ref 3.5–5.0)
Alkaline Phosphatase: 28 U/L — ABNORMAL LOW (ref 38–126)
Anion gap: 6 (ref 5–15)
BUN: 20 mg/dL (ref 8–23)
CO2: 25 mmol/L (ref 22–32)
Calcium: 8.7 mg/dL — ABNORMAL LOW (ref 8.9–10.3)
Chloride: 110 mmol/L (ref 98–111)
Creatinine, Ser: 1.42 mg/dL — ABNORMAL HIGH (ref 0.61–1.24)
GFR, Estimated: 53 mL/min — ABNORMAL LOW (ref >60)
Glucose, Bld: 121 mg/dL — ABNORMAL HIGH (ref 70–99)
Potassium: 4.4 mmol/L (ref 3.5–5.1)
Sodium: 141 mmol/L (ref 135–145)
Total Bilirubin: 0.6 mg/dL (ref 0.3–1.2)
Total Protein: 6.8 g/dL (ref 6.5–8.1)

## 2022-11-23 LAB — URINALYSIS, ROUTINE W REFLEX MICROSCOPIC
Bilirubin Urine: NEGATIVE
Glucose, UA: NEGATIVE mg/dL
Hgb urine dipstick: NEGATIVE
Ketones, ur: NEGATIVE mg/dL
Leukocytes,Ua: NEGATIVE
Nitrite: NEGATIVE
Protein, ur: NEGATIVE mg/dL
Specific Gravity, Urine: 1.01 (ref 1.005–1.030)
pH: 5 (ref 5.0–8.0)

## 2022-11-23 LAB — CBC
HCT: 41.4 % (ref 39.0–52.0)
Hemoglobin: 12.9 g/dL — ABNORMAL LOW (ref 13.0–17.0)
MCH: 32.4 pg (ref 26.0–34.0)
MCHC: 31.2 g/dL (ref 30.0–36.0)
MCV: 104 fL — ABNORMAL HIGH (ref 80.0–100.0)
Platelets: 96 10*3/uL — ABNORMAL LOW (ref 150–400)
RBC: 3.98 MIL/uL — ABNORMAL LOW (ref 4.22–5.81)
RDW: 12.2 % (ref 11.5–15.5)
WBC: 9.4 10*3/uL (ref 4.0–10.5)
nRBC: 0 % (ref 0.0–0.2)

## 2022-11-23 LAB — TROPONIN I (HIGH SENSITIVITY)
Troponin I (High Sensitivity): 3 ng/L (ref <18)
Troponin I (High Sensitivity): 3 ng/L (ref <18)

## 2022-11-23 LAB — CBG MONITORING, ED: Glucose-Capillary: 115 mg/dL — ABNORMAL HIGH (ref 70–99)

## 2022-11-23 MED ORDER — SODIUM CHLORIDE 0.9 % IV SOLN: Freq: Once | INTRAVENOUS | Status: AC

## 2022-11-23 MED ORDER — DICLOFENAC EPOLAMINE 1.3 % EX PTCH: 1.0000 | MEDICATED_PATCH | Freq: Two times a day (BID) | CUTANEOUS | 1 refills | Status: DC

## 2022-11-23 MED ORDER — HYDROCODONE-ACETAMINOPHEN 5-325 MG PO TABS: 1.0000 | ORAL_TABLET | Freq: Four times a day (QID) | ORAL | 0 refills | Status: DC | PRN

## 2022-11-23 MED ORDER — HYDROMORPHONE HCL 1 MG/ML IJ SOLN
1.0000 mg | Freq: Once | INTRAMUSCULAR | Status: AC
  Administered 2022-11-23: 1 mg via INTRAMUSCULAR
  Filled 2022-11-23: qty 1

## 2022-11-23 MED ORDER — MORPHINE SULFATE (PF) 4 MG/ML IV SOLN
4.0000 mg | Freq: Once | INTRAVENOUS | Status: AC
  Administered 2022-11-23: 4 mg via INTRAVENOUS
  Filled 2022-11-23: qty 1

## 2022-11-23 MED ORDER — DICLOFENAC EPOLAMINE 1.3 % EX PTCH
1.0000 | MEDICATED_PATCH | Freq: Two times a day (BID) | CUTANEOUS | Status: DC
  Administered 2022-11-23: 1 via TRANSDERMAL
  Filled 2022-11-23 (×2): qty 1

## 2022-11-23 NOTE — ED Notes (Signed)
BLOODWORK RECOLLECTED AND SENT TO LAB

## 2022-11-23 NOTE — ED Notes (Signed)
Bloodwork collected and sent to lab. Pt laying on stretcher on cm sp02 and nibp, call bell in reach. Family at bedside

## 2022-11-23 NOTE — ED Notes (Signed)
Ortho tech jake to bring brace down for patient.

## 2022-11-23 NOTE — ED Notes (Signed)
Called out of ortho tech about TLSO brace

## 2022-11-23 NOTE — ED Triage Notes (Signed)
C/o lower back pain after syncopal episode x2 mins. Denies blood thinner usage. Unsure if hit head.  Denies cp  Hx DM [id number]

## 2022-11-23 NOTE — ED Notes (Signed)
Patient transported to X-ray 

## 2022-11-23 NOTE — Discharge Instructions (Addendum)
As discussed, with your lumbar spine fracture it is important that you monitor your condition carefully and follow-up with our neurosurgeon as well as with your primary care physician.  Return here for concerning changes in your condition.

## 2022-11-23 NOTE — Progress Notes (Signed)
Orthopedic Tech Progress Note Patient Details:  Justin Clayton 01-01-1951 [id number]  Patient ID: Justin Clayton, male   DOB: December 17, 1950, 72 y.o.   MRN: [id number] RN to call back once it is confirmed on if the patient is being admitted or discharged.  Vernona Rieger 11/23/2022, 2:06 PM

## 2022-11-23 NOTE — Progress Notes (Signed)
Orthopedic Tech Progress Note Patient Details:  Justin Clayton July 28, 1951 [id number]  Ortho Devices Type of Ortho Device: Thoracolumbar corset (TLSO) Ortho Device/Splint Interventions: Ordered, Application, Adjustment, Removal Pt issued Sleeq brand TLSO secondary to discharge from the ED.   Post Interventions Patient Tolerated: Well Instructions Provided: Adjustment of device, Care of device  Justin Clayton OTR/L 11/23/2022, 4:32 PM

## 2022-11-23 NOTE — ED Provider Notes (Signed)
[facility] Provider Note   CSN: [id number] Arrival date & time: 11/23/22  [id number]     History  Chief Complaint  Patient presents with   Loss of Consciousness    Justin Clayton is a 72 y.o. male.  HPI Patient presents with his wife who assists with the history. Patient was in his usual state of health until this morning.  He notes that after awakening, rushing to the bathroom he was lightheaded, had episode of syncope, fall.  No chest pain prior to or after the event.  However, the patient did develop severe low back pain without incontinence, weakness, or numbness in his lower extremities.  He notes a history of low back issues secondary to his history in the [employer] Home Medications Prior to Admission medications   Medication Sig Start Date End Date Taking? Authorizing Provider  diclofenac (FLECTOR) 1.3 % PTCH Place 1 patch onto the skin 2 (two) times daily. 11/23/22  Yes Carmin Muskrat, MD  HYDROcodone-acetaminophen (NORCO/VICODIN) 5-325 MG tablet Take 1 tablet by mouth every 6 (six) hours as needed for severe pain. 11/23/22  Yes Carmin Muskrat, MD  acetaminophen (TYLENOL) 325 MG tablet Take 650 mg by mouth daily as needed for moderate pain.     [provider]  acyclovir (ZOVIRAX) 800 MG tablet Take 400 mg by mouth daily. Taking 1/2 of '800mg'$     [provider]  albuterol (PROVENTIL HFA;VENTOLIN HFA) 108 (90 BASE) MCG/ACT inhaler Inhale 2 puffs into the lungs every 4 (four) hours as needed. For shortness of breath    [provider]  aspirin EC 81 MG tablet Take 81 mg by mouth daily.    [provider]  atorvastatin (LIPITOR) 80 MG tablet Take 80 mg by mouth every evening.     [provider]  Cholecalciferol (VITAMIN D-3) 1000 UNITS CAPS Take 1 capsule by mouth daily.     [provider]  Colloidal Oatmeal (GOLD BOND ECZEMA RELIEF) 2 % CREA Apply 1 application topically as  needed (dry skin).    [provider]  diclofenac Sodium (VOLTAREN) 1 % GEL Apply 2 g topically 4 (four) times daily. 11/24/20   Long, Wonda Olds, MD  docusate sodium (COLACE) 100 MG capsule Take 100 mg by mouth 2 (two) times daily.    [provider]  fish oil-omega-3 fatty acids 1000 MG capsule Take 1 g by mouth 2 (two) times daily.     [provider]  gabapentin (NEURONTIN) 300 MG capsule Take 300 mg by mouth 2 (two) times daily.     [provider]  glipiZIDE (GLUCOTROL) 10 MG tablet Take 10 mg by mouth 2 (two) times daily before a meal.    [provider]  insulin glargine (LANTUS) 100 units/mL SOLN Inject 32 Units into the skin daily at 10 pm.     [provider]  lisinopril (PRINIVIL,ZESTRIL) 40 MG tablet Take 0.5 tablets (20 mg total) by mouth daily. Patient taking differently: Take 40 mg by mouth daily.  10/25/18   Georgette Shell, MD  nicotine (NICODERM CQ - DOSED IN MG/24 HR) 7 mg/24hr patch Place 7 mg onto the skin daily as needed (nicotine addiction).    [provider]  pantoprazole (PROTONIX) 40 MG tablet Take 40 mg by mouth daily.    [provider]  terazosin (HYTRIN) 5 MG capsule Take 5 mg by mouth at bedtime.    [provider]  Allergies    Patient has no known allergies.    Review of Systems   Review of Systems  All other systems reviewed and are negative.   Physical Exam Updated Vital Signs BP (!) 154/82   Pulse 61   Temp 97.8 F (36.6 C) (Oral)   Resp 13   Wt 99 kg   SpO2 97%   BMI 28.80 kg/m  Physical Exam Vitals and nursing note reviewed.  Constitutional:      General: He is not in acute distress.    Appearance: He is well-developed.  HENT:     Head: Normocephalic and atraumatic.  Eyes:     Conjunctiva/sclera: Conjunctivae normal.  Cardiovascular:     Rate and Rhythm: Normal rate and regular rhythm.  Pulmonary:     Effort: Pulmonary effort is normal. No  respiratory distress.     Breath sounds: No stridor.  Abdominal:     General: There is no distension.  Musculoskeletal:       Arms:  Skin:    General: Skin is warm and dry.  Neurological:     Mental Status: He is alert and oriented to person, place, and time.     Comments: No focal deficits, patient moves all extremities to command.     ED Results / Procedures / Treatments   Labs (all labs ordered are listed, but only abnormal results are displayed) Labs Reviewed  CBC - Abnormal; Notable for the following components:      Result Value   RBC 3.98 (*)    Hemoglobin 12.9 (*)    MCV 104.0 (*)    Platelets 96 (*)    All other components within normal limits  COMPREHENSIVE METABOLIC PANEL - Abnormal; Notable for the following components:   Glucose, Bld 121 (*)    Creatinine, Ser 1.42 (*)    Calcium 8.7 (*)    Albumin 3.3 (*)    Alkaline Phosphatase 28 (*)    GFR, Estimated 53 (*)    All other components within normal limits  CBG MONITORING, ED - Abnormal; Notable for the following components:   Glucose-Capillary 115 (*)    All other components within normal limits  URINALYSIS, ROUTINE W REFLEX MICROSCOPIC  CBG MONITORING, ED  TROPONIN I (HIGH SENSITIVITY)  TROPONIN I (HIGH SENSITIVITY)    EKG EKG Interpretation  Date/Time:  Tuesday November 23 2022 08:50:36 EST Ventricular Rate:  90 PR Interval:  144 QRS Duration: 90 QT Interval:  342 QTC Calculation: 419 R Axis:   78 Text Interpretation: Sinus rhythm Consider inferior infarct no wpw, prolonged qt or brugada No significant change since last tracing Confirmed by Deno Etienne [phone]) on 11/23/2022 9:01:19 AM  Radiology CT Lumbar Spine Wo Contrast  Result Date: 11/23/2022 CLINICAL DATA:  Low back pain after syncopal episode x2 minutes. Lumbar radiculopathy. EXAM: CT LUMBAR SPINE WITHOUT CONTRAST TECHNIQUE: Multidetector CT imaging of the lumbar spine was performed without intravenous contrast administration. Multiplanar  CT image reconstructions were also generated. RADIATION DOSE REDUCTION: This exam was performed according to the departmental dose-optimization program which includes automated exposure control, adjustment of the mA and/or kV according to patient size and/or use of iterative reconstruction technique. COMPARISON:  Plain films 11/23/2022 and CT 07/05/2022 FINDINGS: Segmentation: 5 lumbar type vertebrae. Alignment: Normal. Vertebrae: Examination demonstrates mild spondylosis throughout the lumbar spine to include facet arthropathy. There is an acute fracture involving the superior endplate of L3 with suggestion of mild comminution and only very minimal loss of vertebral body height.  No additional fractures visualized. Paraspinal and other soft tissues: Negative. Disc levels: L1-2 level demonstrates no canal stenosis, neural foraminal narrowing or disc herniation. L2-3 level demonstrates no disc herniation, canal stenosis or neural foraminal narrowing. L3-4 level demonstrates mild broad-based disc bulge without disc herniation or significant canal stenosis. No significant neural foraminal narrowing. L4-5 level demonstrates mild broad-based disc bulge without focal disc herniation. Mild bilateral neural foraminal narrowing left worse than right due to the disc disease and facet arthropathy. No canal stenosis. L5-S1 level demonstrates minimal broad-based disc bulge without definite focal disc herniation. No canal stenosis or significant neural foraminal narrowing. Pseudoarthrosis of the left transverse process of L5 with the sacrum. Calcified plaque over the abdominal aorta which is normal caliber. IMPRESSION: 1. Acute fracture involving the superior endplate of L3 with suggestion of mild comminution and only very minimal loss of vertebral body height. 2. Mild spondylosis of the lumbar spine with mild broad-based disc bulges at the L3-4 through L5-S1 levels without focal disc herniation or significant canal stenosis. Mild  bilateral neural foraminal narrowing at L4-5 due to the disc disease and facet arthropathy. 3. Aortic atherosclerosis. Aortic Atherosclerosis (ICD10-I70.0). Electronically Signed   By: Marin Olp M.D.   On: 11/23/2022 13:29   DG Chest 2 View  Result Date: 11/23/2022 CLINICAL DATA:  Syncope. EXAM: CHEST - 2 VIEW COMPARISON:  Chest x-ray October 9, 23. FINDINGS: The heart size and mediastinal contours are within normal limits. Both lungs are clear. No visible pleural effusions or pneumothorax. No acute osseous abnormality. IMPRESSION: No active cardiopulmonary disease. Electronically Signed   By: Margaretha Sheffield M.D.   On: 11/23/2022 09:54   DG Lumbar Spine Complete  Result Date: 11/23/2022 CLINICAL DATA:  Fall with low back pain. EXAM: LUMBAR SPINE - COMPLETE 4+ VIEW COMPARISON:  None Available. FINDINGS: There is no evidence of lumbar spine fracture. Alignment is normal. Intervertebral disc spaces are maintained. IMPRESSION: Negative. Electronically Signed   By: Misty Stanley M.D.   On: 11/23/2022 09:52    Procedures Procedures    Medications Ordered in ED Medications  diclofenac (FLECTOR) 1.3 % 1 patch (1 patch Transdermal Patch Applied 11/23/22 1301)  morphine (PF) 4 MG/ML injection 4 mg (4 mg Intravenous Given 11/23/22 0947)  0.9 %  sodium chloride infusion ( Intravenous New Bag/Given 11/23/22 0946)  HYDROmorphone (DILAUDID) injection 1 mg (1 mg Intramuscular Given 11/23/22 1301)    ED Course/ Medical Decision Making/ A&P                             Medical Decision Making Patient with history of hypertension, diabetes, no cardiac disease presents after episode of likely syncope, fall.  Differential includes arrhythmia, orthostatic hypotension, less likely ACS, arrhythmia.  Posttraumatic sequelae including lumbar spine fracture, contusion all considered.  Patient started on cardiac monitoring, received morphine. Cardiac 90 sinus normal Pulse ox 100% room air normal   Amount and/or  Complexity of Data Reviewed Independent Historian: spouse External Data Reviewed: notes. Labs: ordered. Decision-making details documented in ED Course. Radiology: ordered and independent interpretation performed. Decision-making details documented in ED Course. ECG/medicine tests: ordered and independent interpretation performed. Decision-making details documented in ED Course.  Risk Prescription drug management. Decision regarding hospitalization.  Update: I reviewed the patient's CT scan demonstrated images to the patient and his wife at bedside.  Images notable for L4 endplate compression fracture, multiple disc bulges surrounding it.  Labs reviewed, discussed, 2 normal troponin.  3:24 PM Patient able to bear weight, standing at the edge of his bed.  I discussed his case with our neurosurgical colleagues. Patient will have TLSO brace, follow-up with him as an outpatient. In essence this elderly male presents after episode of syncope, fall.  Some suspicion for the patient's orthostatic hypotension contributing to his fall, given the absence of other alarming findings, no arrhythmia, no evidence for ACS, no neuro complaints nor findings on exam.  Patient comfortable with close outpatient follow-up in this regard.  Notably patient was found to have lumbar spine fracture, described as above, requiring outpatient neurosurgery follow-up.         Final Clinical Impression(s) / ED Diagnoses Final diagnoses:  Syncope and collapse  Nontraumatic compression fracture of L4 vertebra, initial encounter [facility])    Rx / DC Orders ED Discharge Orders          Ordered    HYDROcodone-acetaminophen (NORCO/VICODIN) 5-325 MG tablet  Every 6 hours PRN        11/23/22 1524    diclofenac (FLECTOR) 1.3 % PTCH  2 times daily        11/23/22 1524              Carmin Muskrat, MD 11/23/22 1524

## 2023-02-14 ENCOUNTER — Emergency Department (HOSPITAL_COMMUNITY)
Admission: EM | Admit: 2023-02-14 | Discharge: 2023-02-14 | Disposition: A | Discharge status: Home / Self Care | Payer: No Typology Code available for payment source | Attending: Emergency Medicine | Admitting: Emergency Medicine

## 2023-02-14 ENCOUNTER — Other Ambulatory Visit: Payer: Self-pay

## 2023-02-14 ENCOUNTER — Encounter (HOSPITAL_COMMUNITY): Payer: Self-pay

## 2023-02-14 DIAGNOSIS — R790 Abnormal level of blood mineral: Secondary | ICD-10-CM | POA: Diagnosis present

## 2023-02-14 DIAGNOSIS — E875 Hyperkalemia: Secondary | ICD-10-CM | POA: Diagnosis not present

## 2023-02-14 LAB — CBC WITH DIFFERENTIAL/PLATELET
Abs Immature Granulocytes: 0.02 10*3/uL (ref 0.00–0.07)
Basophils Absolute: 0 10*3/uL (ref 0.0–0.1)
Basophils Relative: 0 %
Eosinophils Absolute: 0.1 10*3/uL (ref 0.0–0.5)
Eosinophils Relative: 1 %
HCT: 35.5 % — ABNORMAL LOW (ref 39.0–52.0)
Hemoglobin: 11.7 g/dL — ABNORMAL LOW (ref 13.0–17.0)
Immature Granulocytes: 0 %
Lymphocytes Relative: 39 %
Lymphs Abs: 2.6 10*3/uL (ref 0.7–4.0)
MCH: 32 pg (ref 26.0–34.0)
MCHC: 33 g/dL (ref 30.0–36.0)
MCV: 97 fL (ref 80.0–100.0)
Monocytes Absolute: 0.5 10*3/uL (ref 0.1–1.0)
Monocytes Relative: 8 %
Neutro Abs: 3.4 10*3/uL (ref 1.7–7.7)
Neutrophils Relative %: 52 %
Platelets: 85 10*3/uL — ABNORMAL LOW (ref 150–400)
RBC: 3.66 MIL/uL — ABNORMAL LOW (ref 4.22–5.81)
RDW: 12.6 % (ref 11.5–15.5)
WBC: 6.6 10*3/uL (ref 4.0–10.5)
nRBC: 0 % (ref 0.0–0.2)

## 2023-02-14 LAB — COMPREHENSIVE METABOLIC PANEL
ALT: 12 U/L (ref 0–44)
AST: 14 U/L — ABNORMAL LOW (ref 15–41)
Albumin: 3.5 g/dL (ref 3.5–5.0)
Alkaline Phosphatase: 39 U/L (ref 38–126)
Anion gap: 3 — ABNORMAL LOW (ref 5–15)
BUN: 23 mg/dL (ref 8–23)
CO2: 25 mmol/L (ref 22–32)
Calcium: 8.8 mg/dL — ABNORMAL LOW (ref 8.9–10.3)
Chloride: 108 mmol/L (ref 98–111)
Creatinine, Ser: 1.61 mg/dL — ABNORMAL HIGH (ref 0.61–1.24)
GFR, Estimated: 45 mL/min — ABNORMAL LOW (ref >60)
Glucose, Bld: 133 mg/dL — ABNORMAL HIGH (ref 70–99)
Potassium: 5.4 mmol/L — ABNORMAL HIGH (ref 3.5–5.1)
Sodium: 136 mmol/L (ref 135–145)
Total Bilirubin: 0.4 mg/dL (ref 0.3–1.2)
Total Protein: 7.5 g/dL (ref 6.5–8.1)

## 2023-02-14 MED ORDER — SODIUM ZIRCONIUM CYCLOSILICATE 5 G PO PACK
5.0000 g | PACK | Freq: Once | ORAL | Status: AC
  Administered 2023-02-14: 5 g via ORAL
  Filled 2023-02-14: qty 1

## 2023-02-14 NOTE — Discharge Instructions (Signed)
Bring a copy of your lab tests with you when you see Dr. Next time.  Follow-up as needed

## 2023-02-14 NOTE — ED Triage Notes (Addendum)
[facility] due [facility] lipids, liver, potassium, and kidney function.  Pt reports went for routine 6 month check up.  Denies urinary s/sy.  Denies cp/sob

## 2023-02-14 NOTE — ED Provider Triage Note (Signed)
Emergency Medicine Provider Triage Evaluation Note  Justin Clayton , a 72 y.o. male  was evaluated in triage.  Pt complains of normal labs.  States that his primary care provider at the [place] called him stating that his lipids, potassium, liver function panel were all "out of whack" and that he needed to go to the ER.  Patient denies any chest pain, shortness of breath, nausea/vomiting, abdominal pain, headaches, vision changes, fevers.  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  Review of Systems  Positive: See HPI Negative: See HPI  Physical Exam  BP 113/85 (BP Location: Left Arm)   Pulse (!) 101   Temp 98.2 F (36.8 C) (Oral)   Resp 16   Wt 95.3 kg   SpO2 100%   BMI 27.71 kg/m  Gen:   Awake, no distress   Resp:  Normal effort  MSK:   Moves extremities without difficulty  Other:    Medical Decision Making  Medically screening exam initiated at 3:45 PM.  Appropriate orders placed.  [name] was informed that the remainder of the evaluation will be completed by another provider, this initial triage assessment does not replace that evaluation, and the importance of remaining in the ED until their evaluation is complete.  Labs ordered, patient stable this time   [name] 02/14/23 1551

## 2023-02-14 NOTE — ED Provider Notes (Signed)
[place] EMERGENCY DEPARTMENT AT [facility] Provider Note   CSN: [id number] Arrival date & time: 02/14/23  1453     History {Add pertinent medical, surgical, social history, OB history to HPI:1} Chief Complaint  Patient presents with   abnormal labs    Justin Clayton is a 72 y.o. male.  Patient was sent over here by his doctor for abnormal potassium, liver studies.   Weakness      Home Medications Prior to Admission medications   Medication Sig Start Date End Date Taking? Authorizing Provider  acetaminophen (TYLENOL) 325 MG tablet Take 650 mg by mouth daily as needed for moderate pain.     [provider]  acyclovir (ZOVIRAX) 800 MG tablet Take 400 mg by mouth daily. Taking 1/2 of 800mg     [provider]  albuterol (PROVENTIL HFA;VENTOLIN HFA) 108 (90 BASE) MCG/ACT inhaler Inhale 2 puffs into the lungs every 4 (four) hours as needed. For shortness of breath    [provider]  aspirin EC 81 MG tablet Take 81 mg by mouth daily.    [provider]  atorvastatin (LIPITOR) 80 MG tablet Take 80 mg by mouth every evening.     [provider]  Cholecalciferol (VITAMIN D-3) 1000 UNITS CAPS Take 1 capsule by mouth daily.     [provider]  Colloidal Oatmeal (GOLD BOND ECZEMA RELIEF) 2 % CREA Apply 1 application topically as needed (dry skin).    [provider]  diclofenac (FLECTOR) 1.3 % PTCH Place 1 patch onto the skin 2 (two) times daily. 11/23/22   Gerhard Munch, MD  diclofenac Sodium (VOLTAREN) 1 % GEL Apply 2 g topically 4 (four) times daily. 11/24/20   Long, Arlyss Repress, MD  docusate sodium (COLACE) 100 MG capsule Take 100 mg by mouth 2 (two) times daily.    [provider]  fish oil-omega-3 fatty acids 1000 MG capsule Take 1 g by mouth 2 (two) times daily.     [provider]  gabapentin (NEURONTIN) 300 MG capsule Take 300 mg by mouth 2 (two) times daily.     [provider]  glipiZIDE (GLUCOTROL) 10 MG tablet Take 10 mg by mouth 2 (two) times daily before a meal.    [provider]  HYDROcodone-acetaminophen (NORCO/VICODIN) 5-325 MG tablet Take 1 tablet by mouth every 6 (six) hours as needed for severe pain. 11/23/22   Gerhard Munch, MD  insulin glargine (LANTUS) 100 units/mL SOLN Inject 32 Units into the skin daily at 10 pm.     [provider]  lisinopril (PRINIVIL,ZESTRIL) 40 MG tablet Take 0.5 tablets (20 mg total) by mouth daily. Patient taking differently: Take 40 mg by mouth daily.  10/25/18   Alwyn Ren, MD  nicotine (NICODERM CQ - DOSED IN MG/24 HR) 7 mg/24hr patch Place 7 mg onto the skin daily as needed (nicotine addiction).    [provider]  pantoprazole (PROTONIX) 40 MG tablet Take 40 mg by mouth daily.    [provider]  terazosin (HYTRIN) 5 MG capsule Take 5 mg by mouth at bedtime.    [provider]      Allergies    Patient has no known allergies.    Review of Systems   Review of Systems  Neurological:  Positive for weakness.    Physical Exam Updated Vital Signs BP (!) 147/93 (BP Location: Left Arm)   Pulse 81   Temp 98.5 F (36.9 C) (Oral)  Resp 16   Ht 6\' 1"  (1.854 m)   Wt 95.3 kg   SpO2 100%   BMI 27.71 kg/m  Physical Exam  ED Results / Procedures / Treatments   Labs (all labs ordered are listed, but only abnormal results are displayed) Labs Reviewed  CBC WITH DIFFERENTIAL/PLATELET - Abnormal; Notable for the following components:      Result Value   RBC 3.66 (*)    Hemoglobin 11.7 (*)    HCT 35.5 (*)    Platelets 85 (*)    All other components within normal limits  COMPREHENSIVE METABOLIC PANEL - Abnormal; Notable for the following components:   Potassium 5.4 (*)    Glucose, Bld 133 (*)    Creatinine, Ser 1.61 (*)    Calcium 8.8 (*)    AST 14 (*)    GFR, Estimated 45 (*)    Anion gap 3 (*)    All other components within normal limits     EKG None  Radiology No results found.  Procedures Procedures  {Document cardiac monitor, telemetry assessment procedure when appropriate:1}  Medications Ordered in ED Medications  sodium zirconium cyclosilicate (LOKELMA) packet 5 g (has no administration in time range)    ED Course/ Medical Decision Making/ A&P   {   Click here for ABCD2, HEART and other calculatorsREFRESH Note before signing :1}                          Medical Decision Making Risk Prescription drug management.   Patient has mild elevated potassium and he will follow-up with his PCP  {Document critical care time when appropriate:1} {Document review of labs and clinical decision tools ie heart score, Chads2Vasc2 etc:1}  {Document your independent review of radiology images, and any outside records:1} {Document your discussion with family members, caretakers, and with consultants:1} {Document social determinants of health affecting pt's care:1} {Document your decision making why or why not admission, treatments were needed:1} Final Clinical Impression(s) / ED Diagnoses Final diagnoses:  Hyperkalemia    Rx / DC Orders ED Discharge Orders     None

## 2023-03-18 ENCOUNTER — Emergency Department (HOSPITAL_COMMUNITY)
Admission: EM | Admit: 2023-03-18 | Discharge: 2023-03-18 | Disposition: A | Discharge status: Home / Self Care | Payer: No Typology Code available for payment source | Attending: Emergency Medicine | Admitting: Emergency Medicine

## 2023-03-18 ENCOUNTER — Other Ambulatory Visit: Payer: Self-pay

## 2023-03-18 DIAGNOSIS — N189 Chronic kidney disease, unspecified: Secondary | ICD-10-CM | POA: Insufficient documentation

## 2023-03-18 DIAGNOSIS — Z79899 Other long term (current) drug therapy: Secondary | ICD-10-CM | POA: Diagnosis not present

## 2023-03-18 DIAGNOSIS — Z794 Long term (current) use of insulin: Secondary | ICD-10-CM | POA: Diagnosis not present

## 2023-03-18 DIAGNOSIS — E1122 Type 2 diabetes mellitus with diabetic chronic kidney disease: Secondary | ICD-10-CM | POA: Insufficient documentation

## 2023-03-18 DIAGNOSIS — I129 Hypertensive chronic kidney disease with stage 1 through stage 4 chronic kidney disease, or unspecified chronic kidney disease: Secondary | ICD-10-CM | POA: Insufficient documentation

## 2023-03-18 DIAGNOSIS — R55 Syncope and collapse: Secondary | ICD-10-CM | POA: Insufficient documentation

## 2023-03-18 DIAGNOSIS — Z7982 Long term (current) use of aspirin: Secondary | ICD-10-CM | POA: Diagnosis not present

## 2023-03-18 DIAGNOSIS — Z7984 Long term (current) use of oral hypoglycemic drugs: Secondary | ICD-10-CM | POA: Diagnosis not present

## 2023-03-18 DIAGNOSIS — I951 Orthostatic hypotension: Secondary | ICD-10-CM

## 2023-03-18 LAB — URINALYSIS, ROUTINE W REFLEX MICROSCOPIC
Bilirubin Urine: NEGATIVE
Glucose, UA: NEGATIVE mg/dL
Hgb urine dipstick: NEGATIVE
Ketones, ur: NEGATIVE mg/dL
Leukocytes,Ua: NEGATIVE
Nitrite: NEGATIVE
Protein, ur: 30 mg/dL — AB
Specific Gravity, Urine: 1.021 (ref 1.005–1.030)
pH: 5 (ref 5.0–8.0)

## 2023-03-18 LAB — CBC
HCT: 36.1 % — ABNORMAL LOW (ref 39.0–52.0)
Hemoglobin: 11.4 g/dL — ABNORMAL LOW (ref 13.0–17.0)
MCH: 31.1 pg (ref 26.0–34.0)
MCHC: 31.6 g/dL (ref 30.0–36.0)
MCV: 98.4 fL (ref 80.0–100.0)
Platelets: 71 10*3/uL — ABNORMAL LOW (ref 150–400)
RBC: 3.67 MIL/uL — ABNORMAL LOW (ref 4.22–5.81)
RDW: 13 % (ref 11.5–15.5)
WBC: 6.8 10*3/uL (ref 4.0–10.5)
nRBC: 0 % (ref 0.0–0.2)

## 2023-03-18 LAB — BASIC METABOLIC PANEL
Anion gap: 10 (ref 5–15)
BUN: 32 mg/dL — ABNORMAL HIGH (ref 8–23)
CO2: 18 mmol/L — ABNORMAL LOW (ref 22–32)
Calcium: 8.8 mg/dL — ABNORMAL LOW (ref 8.9–10.3)
Chloride: 109 mmol/L (ref 98–111)
Creatinine, Ser: 1.71 mg/dL — ABNORMAL HIGH (ref 0.61–1.24)
GFR, Estimated: 42 mL/min — ABNORMAL LOW (ref >60)
Glucose, Bld: 217 mg/dL — ABNORMAL HIGH (ref 70–99)
Potassium: 4.5 mmol/L (ref 3.5–5.1)
Sodium: 137 mmol/L (ref 135–145)

## 2023-03-18 LAB — CBG MONITORING, ED
Glucose-Capillary: 209 mg/dL — ABNORMAL HIGH (ref 70–99)
Glucose-Capillary: 248 mg/dL — ABNORMAL HIGH (ref 70–99)

## 2023-03-18 MED ORDER — LACTATED RINGERS IV BOLUS
1000.0000 mL | Freq: Once | INTRAVENOUS | Status: AC
  Administered 2023-03-18: 1000 mL via INTRAVENOUS

## 2023-03-18 NOTE — ED Triage Notes (Signed)
Pt arrived via POV. Pt c/o dizziness, and near syncope- pt has fallen several times recently. No LOC, no head trauma. Pt has been struggling with this for past 2x years. Has seen providers.  Pt hypotensive in triage.  AOx4

## 2023-03-18 NOTE — ED Provider Notes (Signed)
[place] EMERGENCY DEPARTMENT AT [facility] Provider Note   CSN: [id number] Arrival date & time: 03/18/23  1548     History  Chief Complaint  Patient presents with   Near Syncope   Fatigue   Fall    Justin Clayton is a 71 y.o. male.  72 year old male with a history of hypertension, diabetes, CKD, and alcohol/cocaine abuse in remission who presents to the emergency department after presyncopal event today at home.  Reports that he stood up and was walking to another room when he manage approximately 4 steps and started feeling very shaky all over and sat himself to the floor.  Did not have any head strike or full LOC.  For the past several months has been having similar episodes where he will lose consciousness or fall when he starts walking after being in a seated position.  No new medication changes.  Does not have any other preceding symptoms such as chest pain, shortness of breath, or palpitations.  No personal or family history of cardiac disease, arrhythmia, or sudden unexplained death.  Has been following with his outpatient provider for this from the [place] as well as neurology.  He is unsure if he is seeing a cardiologist yet.  Denies any substance or alcohol use recently.       Home Medications Prior to Admission medications   Medication Sig Start Date End Date Taking? Authorizing Provider  acetaminophen (TYLENOL) 325 MG tablet Take 650 mg by mouth daily as needed for moderate pain.     [provider]  acyclovir (ZOVIRAX) 800 MG tablet Take 400 mg by mouth daily. Taking 1/2 of 800mg     [provider]  albuterol (PROVENTIL HFA;VENTOLIN HFA) 108 (90 BASE) MCG/ACT inhaler Inhale 2 puffs into the lungs every 4 (four) hours as needed. For shortness of breath    [provider]  aspirin EC 81 MG tablet Take 81 mg by mouth daily.    [provider]  atorvastatin (LIPITOR) 80 MG tablet Take 80 mg by mouth every evening.      [provider]  Cholecalciferol (VITAMIN D-3) 1000 UNITS CAPS Take 1 capsule by mouth daily.     [provider]  Colloidal Oatmeal (GOLD BOND ECZEMA RELIEF) 2 % CREA Apply 1 application topically as needed (dry skin).    [provider]  diclofenac (FLECTOR) 1.3 % PTCH Place 1 patch onto the skin 2 (two) times daily. 11/23/22   Gerhard Munch, MD  diclofenac Sodium (VOLTAREN) 1 % GEL Apply 2 g topically 4 (four) times daily. 11/24/20   Long, Arlyss Repress, MD  docusate sodium (COLACE) 100 MG capsule Take 100 mg by mouth 2 (two) times daily.    [provider]  fish oil-omega-3 fatty acids 1000 MG capsule Take 1 g by mouth 2 (two) times daily.     [provider]  gabapentin (NEURONTIN) 300 MG capsule Take 300 mg by mouth 2 (two) times daily.     [provider]  glipiZIDE (GLUCOTROL) 10 MG tablet Take 10 mg by mouth 2 (two) times daily before a meal.    [provider]  HYDROcodone-acetaminophen (NORCO/VICODIN) 5-325 MG tablet Take 1 tablet by mouth every 6 (six) hours as needed for severe pain. 11/23/22   Gerhard Munch, MD  insulin glargine (LANTUS) 100 units/mL SOLN Inject 32 Units into the skin daily at 10 pm.     [provider]  lisinopril (PRINIVIL,ZESTRIL) 40 MG tablet Take 0.5 tablets (  20 mg total) by mouth daily. Patient taking differently: Take 40 mg by mouth daily.  10/25/18   Alwyn Ren, MD  nicotine (NICODERM CQ - DOSED IN MG/24 HR) 7 mg/24hr patch Place 7 mg onto the skin daily as needed (nicotine addiction).    [provider]  pantoprazole (PROTONIX) 40 MG tablet Take 40 mg by mouth daily.    [provider]  terazosin (HYTRIN) 5 MG capsule Take 5 mg by mouth at bedtime.    [provider]      Allergies    Patient has no known allergies.    Review of Systems   Review of Systems  Physical Exam Updated Vital Signs BP (!) 141/82   Pulse 76   Temp 98.1 F (36.7 C)  (Oral)   Resp 19   Ht 6\' 1"  (1.854 m)   Wt 95.7 kg   SpO2 100%   BMI 27.84 kg/m  Physical Exam Vitals and nursing note reviewed.  Constitutional:      General: He is not in acute distress.    Appearance: He is well-developed.  HENT:     Head: Normocephalic and atraumatic.     Right Ear: External ear normal.     Left Ear: External ear normal.     Nose: Nose normal.  Eyes:     Extraocular Movements: Extraocular movements intact.     Conjunctiva/sclera: Conjunctivae normal.     Pupils: Pupils are equal, round, and reactive to light.  Cardiovascular:     Rate and Rhythm: Normal rate and regular rhythm.     Heart sounds: Murmur (2/6 systolic) heard.  Pulmonary:     Effort: Pulmonary effort is normal. No respiratory distress.     Breath sounds: Normal breath sounds.  Abdominal:     General: There is no distension.     Palpations: Abdomen is soft. There is no mass.     Tenderness: There is no abdominal tenderness. There is no guarding.  Musculoskeletal:     Cervical back: Normal range of motion and neck supple.     Right lower leg: Edema present.     Left lower leg: Edema present.  Skin:    General: Skin is warm and dry.  Neurological:     Mental Status: He is alert. Mental status is at baseline.  Psychiatric:        Mood and Affect: Mood normal.        Behavior: Behavior normal.     ED Results / Procedures / Treatments   Labs (all labs ordered are listed, but only abnormal results are displayed) Labs Reviewed  BASIC METABOLIC PANEL - Abnormal; Notable for the following components:      Result Value   CO2 18 (*)    Glucose, Bld 217 (*)    BUN 32 (*)    Creatinine, Ser 1.71 (*)    Calcium 8.8 (*)    GFR, Estimated 42 (*)    All other components within normal limits  CBC - Abnormal; Notable for the following components:   RBC 3.67 (*)    Hemoglobin 11.4 (*)    HCT 36.1 (*)    Platelets 71 (*)    All other components within normal limits  URINALYSIS, ROUTINE W  REFLEX MICROSCOPIC - Abnormal; Notable for the following components:   Protein, ur 30 (*)    Bacteria, UA RARE (*)    All other components within normal limits  CBG MONITORING, ED - Abnormal; Notable for  the following components:   Glucose-Capillary 209 (*)    All other components within normal limits  CBG MONITORING, ED - Abnormal; Notable for the following components:   Glucose-Capillary 248 (*)    All other components within normal limits    EKG EKG Interpretation  Date/Time:  Friday March 18 2023 16:00:13 EDT Ventricular Rate:  107 PR Interval:  139 QRS Duration: 86 QT Interval:  320 QTC Calculation: 427 R Axis:   82 Text Interpretation: Sinus tachycardia Borderline right axis deviation Baseline wander in lead(s) I II aVF Confirmed by Vonita Moss [phone]) on 03/18/2023 4:35:14 PM  Radiology No results found.  Procedures Procedures    EMERGENCY DEPARTMENT [place] CARDIAC EXAM "Study: Limited Ultrasound of the Heart and Pericardium"  INDICATIONS: Presyncope Multiple views of the heart and pericardium were obtained in real-time with a multi-frequency probe.  PERFORMED UE:AVWUJW IMAGES ARCHIVED?: No LIMITATIONS:  None VIEWS USED: Subcostal 4 chamber, Parasternal long axis, Parasternal short axis, and Inferior Vena Cava INTERPRETATION: Cardiac activity present, Pericardial effusioin absent, Normal contractility, and IVC flat   Medications Ordered in ED Medications  lactated ringers bolus 1,000 mL (0 mLs Intravenous Stopped 03/18/23 2038)  lactated ringers bolus 1,000 mL (0 mLs Intravenous Stopped 03/18/23 2038)    ED Course/ Medical Decision Making/ A&P Clinical Course as of 03/18/23 2039  Fri Mar 18, 2023  1723 Creatinine(!): 1.71 Baseline of 1.6 [RP]    Clinical Course User Index [RP] Rondel Baton, MD                             Medical Decision Making Amount and/or Complexity of Data Reviewed Labs: ordered. Decision-making details documented in ED  Course.   Cordale Christoffel is a 72 y.o. male with comorbidities that complicate the patient evaluation including hypertension, diabetes, CKD, and alcohol/cocaine abuse in remission who presents to the emergency department after presyncopal event today at home.   Initial Ddx:  Orthostasis, dehydration, MI, PE, arrhythmia, electrolyte abnormality  MDM/Course:  Patient presentation appears to be consistent with orthostasis.  Patient was given 2 L of fluids after he initially was found to be orthostatic and had no further orthostasis.  Symptoms had resolved at that point in time and is able to stand up and walk around without any symptoms at all.  Labs were sent which showed hyperglycemia without evidence of DKA and renal function that was consistent with his diagnosis of CKD.  EKG did not show any concerning findings.  [facility].  Will also have him follow-up with his primary doctor in 2 to 3 days.  Did perform point-of-care echo which did not reveal any gross abnormalities.  Upon re-evaluation patient is feeling much improved.  This patient presents to the ED for concern of complaints listed in HPI, this involves an extensive number of treatment options, and is a complaint that carries with it a high risk of complications and morbidity. Disposition including potential need for admission considered.   Dispo: DC Home. Return precautions discussed including, but not limited to, those listed in the AVS. Allowed pt time to ask questions which were answered fully prior to dc.  Additional history obtained from  friend Records reviewed Outpatient Clinic Notes The following labs were independently interpreted: Chemistry and show CKD I independently reviewed the following imaging with scope of interpretation limited to determining acute life threatening conditions related to emergency care: Chest  x-ray and agree with the radiologist interpretation with the  following exceptions: none I personally reviewed and interpreted cardiac monitoring: normal sinus rhythm  I personally reviewed and interpreted the pt's EKG: see above for interpretation  I have reviewed the patients home medications and made adjustments as needed Social Determinants of health:  Elderly        Final Clinical Impression(s) / ED Diagnoses Final diagnoses:  Orthostatic syncope    Rx / DC Orders ED Discharge Orders     None         Rondel Baton, MD 03/18/23 2040

## 2023-03-18 NOTE — Discharge Instructions (Signed)
You were seen for your dizziness and fainting in the emergency department.   At home, please stay well hydrated.    Check your MyChart online for the results of any tests that had not resulted by the time you left the emergency department.   Follow-up with your primary doctor in 2-3 days regarding your visit.  Please talk to them about your cardiology referral.   Return immediately to the emergency department if you experience any of the following: Recurrent fainting, or any other concerning symptoms.    Thank you for visiting our Emergency Department. It was a pleasure taking care of you today.

## 2023-03-18 NOTE — ED Notes (Signed)
Pt given urinal and made aware of need for urine sample. 

## 2023-06-30 ENCOUNTER — Encounter (HOSPITAL_COMMUNITY): Payer: Self-pay | Admitting: Student

## 2023-07-01 ENCOUNTER — Other Ambulatory Visit (HOSPITAL_COMMUNITY): Payer: Self-pay | Admitting: Student

## 2023-07-01 DIAGNOSIS — S32030G Wedge compression fracture of third lumbar vertebra, subsequent encounter for fracture with delayed healing: Secondary | ICD-10-CM

## 2023-07-11 ENCOUNTER — Encounter (HOSPITAL_COMMUNITY)
Admission: RE | Admit: 2023-07-11 | Discharge: 2023-07-11 | Disposition: A | Discharge status: Home / Self Care | Payer: Medicare HMO | Source: Ambulatory Visit | Attending: Student | Admitting: Student

## 2023-07-11 DIAGNOSIS — S32030G Wedge compression fracture of third lumbar vertebra, subsequent encounter for fracture with delayed healing: Secondary | ICD-10-CM | POA: Diagnosis present

## 2023-07-11 MED ORDER — TECHNETIUM TC 99M MEDRONATE IV KIT
20.0000 | PACK | Freq: Once | INTRAVENOUS | Status: AC | PRN
  Administered 2023-07-11: 20 via INTRAVENOUS

## 2024-04-13 ENCOUNTER — Other Ambulatory Visit: Payer: Self-pay | Admitting: Family Medicine

## 2024-04-13 DIAGNOSIS — K7400 Hepatic fibrosis, unspecified: Secondary | ICD-10-CM

## 2024-04-23 ENCOUNTER — Encounter: Payer: Self-pay | Admitting: Family Medicine

## 2024-04-27 ENCOUNTER — Other Ambulatory Visit

## 2024-04-30 ENCOUNTER — Other Ambulatory Visit: Payer: Self-pay

## 2024-04-30 ENCOUNTER — Emergency Department (HOSPITAL_COMMUNITY)

## 2024-04-30 ENCOUNTER — Inpatient Hospital Stay (HOSPITAL_COMMUNITY)
Admission: EM | Admit: 2024-04-30 | Discharge: 2024-05-03 | DRG: 312 | Disposition: A | Discharge status: Home / Self Care | Source: Ambulatory Visit | Attending: Internal Medicine | Admitting: Internal Medicine

## 2024-04-30 ENCOUNTER — Encounter (HOSPITAL_COMMUNITY): Payer: Self-pay | Admitting: Emergency Medicine

## 2024-04-30 DIAGNOSIS — Z7984 Long term (current) use of oral hypoglycemic drugs: Secondary | ICD-10-CM

## 2024-04-30 DIAGNOSIS — Z1152 Encounter for screening for COVID-19: Secondary | ICD-10-CM

## 2024-04-30 DIAGNOSIS — Z794 Long term (current) use of insulin: Secondary | ICD-10-CM

## 2024-04-30 DIAGNOSIS — R55 Syncope and collapse: Principal | ICD-10-CM | POA: Diagnosis present

## 2024-04-30 DIAGNOSIS — I951 Orthostatic hypotension: Secondary | ICD-10-CM | POA: Diagnosis not present

## 2024-04-30 DIAGNOSIS — N1832 Chronic kidney disease, stage 3b: Secondary | ICD-10-CM | POA: Diagnosis present

## 2024-04-30 DIAGNOSIS — E7800 Pure hypercholesterolemia, unspecified: Secondary | ICD-10-CM | POA: Diagnosis present

## 2024-04-30 DIAGNOSIS — E119 Type 2 diabetes mellitus without complications: Secondary | ICD-10-CM

## 2024-04-30 DIAGNOSIS — I358 Other nonrheumatic aortic valve disorders: Secondary | ICD-10-CM | POA: Diagnosis present

## 2024-04-30 DIAGNOSIS — R5381 Other malaise: Secondary | ICD-10-CM | POA: Diagnosis present

## 2024-04-30 DIAGNOSIS — J40 Bronchitis, not specified as acute or chronic: Secondary | ICD-10-CM | POA: Diagnosis present

## 2024-04-30 DIAGNOSIS — R5383 Other fatigue: Secondary | ICD-10-CM | POA: Diagnosis present

## 2024-04-30 DIAGNOSIS — Z811 Family history of alcohol abuse and dependence: Secondary | ICD-10-CM

## 2024-04-30 DIAGNOSIS — E785 Hyperlipidemia, unspecified: Secondary | ICD-10-CM | POA: Diagnosis present

## 2024-04-30 DIAGNOSIS — E1122 Type 2 diabetes mellitus with diabetic chronic kidney disease: Secondary | ICD-10-CM | POA: Diagnosis present

## 2024-04-30 DIAGNOSIS — E860 Dehydration: Secondary | ICD-10-CM | POA: Diagnosis present

## 2024-04-30 DIAGNOSIS — I10 Essential (primary) hypertension: Secondary | ICD-10-CM | POA: Diagnosis present

## 2024-04-30 DIAGNOSIS — D696 Thrombocytopenia, unspecified: Secondary | ICD-10-CM | POA: Diagnosis not present

## 2024-04-30 DIAGNOSIS — F17210 Nicotine dependence, cigarettes, uncomplicated: Secondary | ICD-10-CM | POA: Diagnosis present

## 2024-04-30 DIAGNOSIS — Z8249 Family history of ischemic heart disease and other diseases of the circulatory system: Secondary | ICD-10-CM

## 2024-04-30 DIAGNOSIS — Z7982 Long term (current) use of aspirin: Secondary | ICD-10-CM

## 2024-04-30 DIAGNOSIS — I129 Hypertensive chronic kidney disease with stage 1 through stage 4 chronic kidney disease, or unspecified chronic kidney disease: Secondary | ICD-10-CM | POA: Diagnosis present

## 2024-04-30 DIAGNOSIS — J449 Chronic obstructive pulmonary disease, unspecified: Secondary | ICD-10-CM | POA: Diagnosis present

## 2024-04-30 DIAGNOSIS — I5189 Other ill-defined heart diseases: Secondary | ICD-10-CM

## 2024-04-30 LAB — CBG MONITORING, ED: Glucose-Capillary: 139 mg/dL — ABNORMAL HIGH (ref 70–99)

## 2024-04-30 LAB — URINALYSIS, ROUTINE W REFLEX MICROSCOPIC
Bilirubin Urine: NEGATIVE
Glucose, UA: NEGATIVE mg/dL
Ketones, ur: NEGATIVE mg/dL
Leukocytes,Ua: NEGATIVE
Nitrite: NEGATIVE
Protein, ur: 30 mg/dL — AB
Specific Gravity, Urine: 1.018 (ref 1.005–1.030)
pH: 5 (ref 5.0–8.0)

## 2024-04-30 LAB — COMPREHENSIVE METABOLIC PANEL WITH GFR
ALT: 14 U/L (ref 0–44)
AST: 18 U/L (ref 15–41)
Albumin: 3.2 g/dL — ABNORMAL LOW (ref 3.5–5.0)
Alkaline Phosphatase: 48 U/L (ref 38–126)
Anion gap: 11 (ref 5–15)
BUN: 26 mg/dL — ABNORMAL HIGH (ref 8–23)
CO2: 22 mmol/L (ref 22–32)
Calcium: 8.6 mg/dL — ABNORMAL LOW (ref 8.9–10.3)
Chloride: 104 mmol/L (ref 98–111)
Creatinine, Ser: 1.78 mg/dL — ABNORMAL HIGH (ref 0.61–1.24)
GFR, Estimated: 40 mL/min — ABNORMAL LOW (ref >60)
Glucose, Bld: 140 mg/dL — ABNORMAL HIGH (ref 70–99)
Potassium: 4 mmol/L (ref 3.5–5.1)
Sodium: 137 mmol/L (ref 135–145)
Total Bilirubin: 0.6 mg/dL (ref 0.0–1.2)
Total Protein: 7.7 g/dL (ref 6.5–8.1)

## 2024-04-30 LAB — CBC
HCT: 34.7 % — ABNORMAL LOW (ref 39.0–52.0)
Hemoglobin: 11.4 g/dL — ABNORMAL LOW (ref 13.0–17.0)
MCH: 30.8 pg (ref 26.0–34.0)
MCHC: 32.9 g/dL (ref 30.0–36.0)
MCV: 93.8 fL (ref 80.0–100.0)
Platelets: 91 K/uL — ABNORMAL LOW (ref 150–400)
RBC: 3.7 MIL/uL — ABNORMAL LOW (ref 4.22–5.81)
RDW: 12 % (ref 11.5–15.5)
WBC: 8.1 K/uL (ref 4.0–10.5)
nRBC: 0 % (ref 0.0–0.2)

## 2024-04-30 LAB — TSH: TSH: 0.373 u[IU]/mL (ref 0.350–4.500)

## 2024-04-30 MED ORDER — INSULIN ASPART 100 UNIT/ML IJ SOLN
0.0000 [IU] | INTRAMUSCULAR | Status: DC
  Administered 2024-05-01: 2 [IU] via SUBCUTANEOUS
  Administered 2024-05-01: 3 [IU] via SUBCUTANEOUS
  Administered 2024-05-01 (×2): 1 [IU] via SUBCUTANEOUS
  Administered 2024-05-01: 2 [IU] via SUBCUTANEOUS
  Administered 2024-05-02: 1 [IU] via SUBCUTANEOUS
  Administered 2024-05-02 – 2024-05-03 (×3): 2 [IU] via SUBCUTANEOUS
  Administered 2024-05-03: 1 [IU] via SUBCUTANEOUS
  Filled 2024-04-30: qty 0.09

## 2024-04-30 MED ORDER — SODIUM CHLORIDE 0.9 % IV BOLUS
1000.0000 mL | Freq: Once | INTRAVENOUS | Status: AC
  Administered 2024-04-30: 1000 mL via INTRAVENOUS

## 2024-04-30 MED ORDER — SODIUM CHLORIDE 0.9 % IV SOLN
1.0000 g | INTRAVENOUS | Status: DC
  Administered 2024-05-01 – 2024-05-03 (×3): 1 g via INTRAVENOUS
  Filled 2024-04-30 (×3): qty 10

## 2024-04-30 MED ORDER — GUAIFENESIN 100 MG/5ML PO LIQD
5.0000 mL | Freq: Once | ORAL | Status: AC
  Administered 2024-04-30: 5 mL via ORAL
  Filled 2024-04-30: qty 10

## 2024-04-30 NOTE — ED Triage Notes (Signed)
 Patient presents post 2 falls at home. He does not know why he fell. He possibly had a syncopal episode. He is concerned he may have a thyroid  disorder and states his neck on the left side ,is hurting right. A laceration is located on the left side of his head near his eyebrow.

## 2024-04-30 NOTE — H&P (Signed)
 Justin Clayton FMW:[id number] DOB: 17-Apr-1951 DOA: 04/30/2024     PCP: Delana Corean GRADE, MD    Was getting care at [facility] Patient arrived to ER on 04/30/24 at 1631 Referred by Attending Silvester Ales, MD   Patient coming from:    home Lives   With roomate    Chief Complaint:   Chief Complaint  Patient presents with   Fall   Loss of Consciousness    HPI: Justin Clayton is a 73 y.o. male with medical history significant of thrombocytopenia, DM2, compression fractures, GERD, HTN HLD    Presented with   syncope 2 episodes of syncope today, fell in hte bathroom, today was reaching for something in the bathroom and fell Some lacerations, did not need fixing  Had prior syncope  TSH WNL Neck sore  Known CKD stable Known thrombocytopenia  Repots has been having a cough and sore throat for the past 1 week Has had decreased PO intake  Has been feeling weak and tired  Runny nose   His roommate also been sick and also has fallen down and had falls due to fatigue Patient is worried reports his first fall he went to the barth had to urinate started to urinate and synopsized the second time he was coughing persistently, was trying to get to the fridge to get to drink some water to help his cough and had another episode of syncope as well    Reports increased wheezing  Denis any fever no chills Denies significant ETOH intake   Does not smoke       Regarding pertinent Chronic problems:     Hyperlipidemia -  on statins Lipitor (atorvastatin ) ? Lipid Panel     Component Value Date/Time   CHOL 139 03/01/2017 0727   TRIG 63 03/01/2017 0727   HDL 31 (L) 03/01/2017 0727   CHOLHDL 4.5 03/01/2017 0727   VLDL 13 03/01/2017 0727   LDLCALC 95 03/01/2017 0727     HTN on lisinpril         DM 2 -  Lab Results  Component Value Date   HGBA1C 7.9 (H) 10/24/2018   on insulin , lantus  24     COPD - not  followed by pulmonology     Hx of DVT 2010  -in left leg  not on AC any longer    CKD stage IIIb baseline Cr 1.6 CrCl cannot be calculated (Unknown ideal weight.).  Lab Results  Component Value Date   CREATININE 1.78 (H) 04/30/2024   CREATININE 1.71 (H) 03/18/2023   CREATININE 1.61 (H) 02/14/2023   Lab Results  Component Value Date   NA 137 04/30/2024   CL 104 04/30/2024   K 4.0 04/30/2024   CO2 22 04/30/2024   BUN 26 (H) 04/30/2024   CREATININE 1.78 (H) 04/30/2024   GFRNONAA 40 (L) 04/30/2024   CALCIUM  8.6 (L) 04/30/2024   PHOS 3.2 10/24/2018   ALBUMIN  3.2 (L) 04/30/2024   GLUCOSE 140 (H) 04/30/2024    Chronic anemia - baseline hg Hemoglobin & Hematocrit  Recent Labs    04/30/24 1722  HGB 11.4*     While in ER: Clinical Course as of 04/30/24 2324  Mon Apr 30, 2024  2257 Patient's workup has been fairly unremarkable.  CKD a little up, got some IV fluids.  No traumatic injuries by x-ray.  Chronic thrombocytopenia.  CT with no bleed.  Feel he is at higher risk with his age and his thrombocytopenia to be discharged with  his multiple syncopal events.  Patient is in agreement with plan for admission to the hospital.  Discussed with Dr. Silvester [facility] hospitalist who will evaluate patient for admission. [MB]    Clinical Course User Index [MB] Towana Ozell BROCKS, MD       Lab Orders         Comprehensive metabolic panel         CBC         Urinalysis, Routine w reflex microscopic -Urine, Clean Catch         TSH         CK         Magnesium          Phosphorus         Hemoglobin A1c         CBG monitoring, ED      CT HEAD   NON acute   Right knee non acute Left shoulder non acute CXR -  NON acute    Following Medications were ordered in ER: Medications  insulin  aspart (novoLOG ) injection 0-9 Units (has no administration in time range)  sodium chloride  0.9 % bolus 1,000 mL (1,000 mLs Intravenous New Bag/Given 04/30/24 2032)  guaiFENesin  (ROBITUSSIN) 100 MG/5ML liquid 5 mL (5 mLs Oral Given 04/30/24 2215)    _________     ED Triage Vitals  Encounter Vitals Group     BP 04/30/24 1638 (!) 147/124     Girls Systolic BP Percentile --      Girls Diastolic BP Percentile --      Boys Systolic BP Percentile --      Boys Diastolic BP Percentile --      Pulse Rate 04/30/24 1638 100     Resp 04/30/24 1638 18     Temp 04/30/24 1638 98.4 F (36.9 C)     Temp Source 04/30/24 1638 Oral     SpO2 04/30/24 1638 99 %     Weight --      Height --      Head Circumference --      Peak Flow --      Pain Score 04/30/24 1700 7     Pain Loc --      Pain Education --      Exclude from Growth Chart --   UFJK(75)@     _________________________________________ Significant initial  Findings: Abnormal Labs Reviewed  COMPREHENSIVE METABOLIC PANEL WITH GFR - Abnormal; Notable for the following components:      Result Value   Glucose, Bld 140 (*)    BUN 26 (*)    Creatinine, Ser 1.78 (*)    Calcium  8.6 (*)    Albumin  3.2 (*)    GFR, Estimated 40 (*)    All other components within normal limits  CBC - Abnormal; Notable for the following components:   RBC 3.70 (*)    Hemoglobin 11.4 (*)    HCT 34.7 (*)    Platelets 91 (*)    All other components within normal limits  URINALYSIS, ROUTINE W REFLEX MICROSCOPIC - Abnormal; Notable for the following components:   Hgb urine dipstick SMALL (*)    Protein, ur 30 (*)    Bacteria, UA RARE (*)    All other components within normal limits  CBG MONITORING, ED - Abnormal; Notable for the following components:   Glucose-Capillary 139 (*)    All other components within normal limits      _________________________ Troponin ***ordered Cardiac Panel (last 3 results) No  results for input(s): CKTOTAL, CKMB, TROPONINIHS, RELINDX in the last 72 hours.   ECG: Ordered Personally reviewed and interpreted by me showing: HR : 89 Rhythm: Sinus rhythm No significant change since prior QTC 402     The recent clinical data is shown below. Vitals:   04/30/24 1700 04/30/24 1945  04/30/24 2000 04/30/24 2109  BP: 92/60 (!) 145/46 (!) 151/86   Pulse: 89 72 77   Resp:   (!) 23   Temp:    98.5 F (36.9 C)  TempSrc:    Oral  SpO2: 100% 100% 100%       WBC     Component Value Date/Time   WBC 8.1 04/30/2024 1722   LYMPHSABS 2.6 02/14/2023 1604   MONOABS 0.5 02/14/2023 1604   EOSABS 0.1 02/14/2023 1604   BASOSABS 0.0 02/14/2023 1604        Lactic Acid, Venous No results found for: LATICACIDVEN   Lactic Acid, Venous No results found for: LATICACIDVEN  Procalcitonin *** Ordered      UA   no evidence of UTI    Urine analysis:    Component Value Date/Time   COLORURINE YELLOW 04/30/2024 2033   APPEARANCEUR CLEAR 04/30/2024 2033   LABSPEC 1.018 04/30/2024 2033   PHURINE 5.0 04/30/2024 2033   GLUCOSEU NEGATIVE 04/30/2024 2033   HGBUR SMALL (A) 04/30/2024 2033   BILIRUBINUR NEGATIVE 04/30/2024 2033   KETONESUR NEGATIVE 04/30/2024 2033   PROTEINUR 30 (A) 04/30/2024 2033   UROBILINOGEN 0.2 04/29/2015 2026   NITRITE NEGATIVE 04/30/2024 2033   LEUKOCYTESUR NEGATIVE 04/30/2024 2033    No results found for this or any previous visit.  ABX started Antibiotics Given (last 72 hours)     None        No results found for the last 90 days.    ________________________________________________________________  Arterial ***Venous  Blood Gas result:  pH *** pCO2 ***; pO2 ***;     %O2 Sat ***.  ABG    Component Value Date/Time   TCO2 22 07/05/2022 0920    __________________________________________________________ Recent Labs  Lab 04/30/24 1722  NA 137  K 4.0  CO2 22  GLUCOSE 140*  BUN 26*  CREATININE 1.78*  CALCIUM  8.6*    Cr   stable,    Lab Results  Component Value Date   CREATININE 1.78 (H) 04/30/2024   CREATININE 1.71 (H) 03/18/2023   CREATININE 1.61 (H) 02/14/2023    Recent Labs  Lab 04/30/24 1722  AST 18  ALT 14  ALKPHOS 48  BILITOT 0.6  PROT 7.7  ALBUMIN  3.2*   Lab Results  Component Value Date   CALCIUM  8.6  (L) 04/30/2024   PHOS 3.2 10/24/2018       Plt: Lab Results  Component Value Date   PLT 91 (L) 04/30/2024       Recent Labs  Lab 04/30/24 1722  WBC 8.1  HGB 11.4*  HCT 34.7*  MCV 93.8  PLT 91*    HG/HCT   stable,       Component Value Date/Time   HGB 11.4 (L) 04/30/2024 1722   HCT 34.7 (L) 04/30/2024 1722   MCV 93.8 04/30/2024 1722    _______________________________________________ Hospitalist was called for admission for   Syncope and collapse     The following Work up has been ordered so far:  Orders Placed This Encounter  Procedures   DG Shoulder Left   DG Knee Complete 4 Views Right   DG Chest 1 View   CT  Head Wo Contrast   CT Cervical Spine Wo Contrast   Comprehensive metabolic panel   CBC   Urinalysis, Routine w reflex microscopic -Urine, Clean Catch   TSH   CK   Magnesium    Phosphorus   Hemoglobin A1c   Diet NPO time specified   Document Height and Actual Weight   Patient may eat/drink   Cardiac Monitoring Continuous x 48 hours Indications for use: Syncope of unknown etiology   Apply Diabetes Mellitus Care Plan   STAT CBG when hypoglycemia is suspected. If treated, recheck every 15 minutes after each treatment until CBG >/= 70 mg/dl   Refer to Hypoglycemia Protocol Sidebar Report for treatment of CBG < 70 mg/dl   Full code   Consult to hospitalist   Nutritional services consult   CBG monitoring, ED   ED EKG   ECHOCARDIOGRAM COMPLETE   Place in observation (patient's expected length of stay will be less than 2 midnights)     OTHER Significant initial  Findings:  labs showing:     DM  labs:  HbA1C: No results for input(s): HGBA1C in the last 8760 hours.     CBG (last 3)  Recent Labs    04/30/24 1720  GLUCAP 139*          Cultures: No results found for: SDES, SPECREQUEST, CULT, REPTSTATUS   Radiological Exams on Admission: CT Head Wo Contrast Result Date: 04/30/2024 EXAM: CT HEAD AND CERVICAL SPINE 04/30/2024 10:11:43  PM TECHNIQUE: CT of the head and cervical spine was performed without the administration of intravenous contrast. Multiplanar reformatted images are provided for review. Automated exposure control, iterative reconstruction, and/or weight based adjustment of the mA/kV was utilized to reduce the radiation dose to as low as reasonably achievable. COMPARISON: 10/23/2018 CLINICAL HISTORY: Head trauma, minor (Age >= 65y). Patient presents post 2 falls at home. He does not know why he fell. He possibly had a syncopal episode. He is concerned he may have a thyroid  disorder and states his neck on the left side is hurting right. A laceration is located on the left side of his head near his eyebrow. FINDINGS: CT HEAD BRAIN AND VENTRICLES: No acute intracranial hemorrhage. No mass effect or midline shift. No abnormal extra-axial fluid collection. Gray-white differentiation is maintained. No hydrocephalus. Chronic ischemic white matter changes. ORBITS: No acute abnormality. SINUSES AND MASTOIDS: No acute abnormality. SOFT TISSUES AND SKULL: No acute skull fracture. No acute soft tissue abnormality. CT CERVICAL SPINE BONES AND ALIGNMENT: No acute fracture or traumatic malalignment. Reversal of normal cervical lordosis. DEGENERATIVE CHANGES: No significant degenerative changes. SOFT TISSUES: No prevertebral soft tissue swelling. IMPRESSION: 1. No acute intracranial abnormality. 2. No acute fracture or traumatic malalignment of the cervical spine. Electronically signed by: Franky Stanford MD 04/30/2024 10:40 PM EDT RP Workstation: [id number]   CT Cervical Spine Wo Contrast Result Date: 04/30/2024 EXAM: CT HEAD AND CERVICAL SPINE 04/30/2024 10:11:43 PM TECHNIQUE: CT of the head and cervical spine was performed without the administration of intravenous contrast. Multiplanar reformatted images are provided for review. Automated exposure control, iterative reconstruction, and/or weight based adjustment of the mA/kV was utilized to  reduce the radiation dose to as low as reasonably achievable. COMPARISON: 10/23/2018 CLINICAL HISTORY: Head trauma, minor (Age >= 65y). Patient presents post 2 falls at home. He does not know why he fell. He possibly had a syncopal episode. He is concerned he may have a thyroid  disorder and states his neck on the left side is hurting right. A  laceration is located on the left side of his head near his eyebrow. FINDINGS: CT HEAD BRAIN AND VENTRICLES: No acute intracranial hemorrhage. No mass effect or midline shift. No abnormal extra-axial fluid collection. Gray-white differentiation is maintained. No hydrocephalus. Chronic ischemic white matter changes. ORBITS: No acute abnormality. SINUSES AND MASTOIDS: No acute abnormality. SOFT TISSUES AND SKULL: No acute skull fracture. No acute soft tissue abnormality. CT CERVICAL SPINE BONES AND ALIGNMENT: No acute fracture or traumatic malalignment. Reversal of normal cervical lordosis. DEGENERATIVE CHANGES: No significant degenerative changes. SOFT TISSUES: No prevertebral soft tissue swelling. IMPRESSION: 1. No acute intracranial abnormality. 2. No acute fracture or traumatic malalignment of the cervical spine. Electronically signed by: Franky Stanford MD 04/30/2024 10:40 PM EDT RP Workstation: [id number]   DG Chest 1 View Result Date: 04/30/2024 CLINICAL DATA:  Pain after fall. EXAM: CHEST  1 VIEW COMPARISON:  Radiograph 11/23/2022 FINDINGS: The cardiomediastinal contours are normal. The lungs are clear. Pulmonary vasculature is normal. No consolidation, pleural effusion, or pneumothorax. No acute osseous abnormalities are seen. IMPRESSION: No active disease. Electronically Signed   By: Andrea Gasman M.D.   On: 04/30/2024 20:57   DG Knee Complete 4 Views Right Result Date: 04/30/2024 CLINICAL DATA:  Pain after fall. EXAM: RIGHT KNEE - COMPLETE 4+ VIEW COMPARISON:  None Available. FINDINGS: No evidence of fracture, dislocation, or joint effusion. Tibial intramedullary  nail with proximal locking screws intact were visualized. Healed proximal fibular fracture. Mild soft tissue edema. IMPRESSION: 1. No acute fracture or dislocation of the right knee. 2. Mild soft tissue edema. Electronically Signed   By: Andrea Gasman M.D.   On: 04/30/2024 20:56   DG Shoulder Left Result Date: 04/30/2024 CLINICAL DATA:  Pain after fall. EXAM: LEFT SHOULDER - 2+ VIEW COMPARISON:  None Available. FINDINGS: There is no evidence of fracture or dislocation. Surgical anchors in the humeral head. Widening of the acromioclavicular joint is likely postsurgical. Soft tissues are unremarkable. IMPRESSION: No acute fracture or dislocation of the left shoulder. Electronically Signed   By: Andrea Gasman M.D.   On: 04/30/2024 20:55   _______________________________________________________________________________________________________ Latest  Blood pressure (!) 151/86, pulse 77, temperature 98.5 F (36.9 C), temperature source Oral, resp. rate (!) 23, SpO2 100%.   Vitals  labs and radiology finding personally reviewed  Review of Systems:    Pertinent positives include:  chills, fatigue, shortness of breath at rest.   dyspnea on exertion,  Constitutional:  No weight loss, night sweats, Fevers,  weight loss  HEENT:  No headaches, Difficulty swallowing,Tooth/dental problems,Sore throat,  No sneezing, itching, ear ache, nasal congestion, post nasal drip,  Cardio-vascular:  No chest pain, Orthopnea, PND, anasarca, dizziness, palpitations.no Bilateral lower extremity swelling  GI:  No heartburn, indigestion, abdominal pain, nausea, vomiting, diarrhea, change in bowel habits, loss of appetite, melena, blood in stool, hematemesis Resp:  no No excess mucus, no productive cough, No non-productive cough, No coughing up of blood. No change in color of mucus.No wheezing. Skin:  no rash or lesions. No jaundice GU:  no dysuria, change in color of urine, no urgency or frequency. No straining to  urinate.  No flank pain.  Musculoskeletal:  No joint pain or no joint swelling. No decreased range of motion. No back pain.  Psych:  No change in mood or affect. No depression or anxiety. No memory loss.  Neuro: no localizing neurological complaints, no tingling, no weakness, no double vision, no gait abnormality, no slurred speech, no confusion  All systems reviewed and apart  from HOPI all are negative _______________________________________________________________________________________________ Past Medical History:   Past Medical History:  Diagnosis Date   Asthma    Diabetes mellitus    High cholesterol       Past Surgical History:  Procedure Laterality Date   FRACTURE SURGERY Right    R leg   ROTATOR CUFF REPAIR Bilateral    TEE WITHOUT CARDIOVERSION N/A 10/26/2018   Procedure: TRANSESOPHAGEAL ECHOCARDIOGRAM (TEE);  Surgeon: Francyne Headland, MD;  Location: [facility] ENDOSCOPY;  Service: Cardiovascular;  Laterality: N/A;    Social History:  Ambulatory   cane,      reports that he has been smoking cigarettes. He has never used smokeless tobacco. He reports current alcohol use. He reports that he does not use drugs.   Family History:   Family History  Problem Relation Age of Onset   Cancer Mother    Alcohol abuse Father    CAD Sister    Diabetes Neg Hx    Stroke Neg Hx    ______________________________________________________________________________________________ Allergies: No Known Allergies   Prior to Admission medications   Medication Sig Start Date End Date Taking? Authorizing Provider  acetaminophen  (TYLENOL ) 325 MG tablet Take 650 mg by mouth daily as needed for moderate pain.     [provider]  acyclovir  (ZOVIRAX ) 800 MG tablet Take 400 mg by mouth daily. Taking 1/2 of 800mg     [provider]  albuterol  (PROVENTIL  HFA;VENTOLIN  HFA) 108 (90 BASE) MCG/ACT inhaler Inhale 2 puffs into the lungs every 4 (four) hours as needed. For shortness of  breath    [provider]  aspirin  EC 81 MG tablet Take 81 mg by mouth daily.    [provider]  atorvastatin  (LIPITOR) 80 MG tablet Take 80 mg by mouth every evening.     [provider]  Cholecalciferol (VITAMIN D-3) 1000 UNITS CAPS Take 1 capsule by mouth daily.     [provider]  Colloidal Oatmeal (GOLD BOND ECZEMA RELIEF) 2 % CREA Apply 1 application topically as needed (dry skin).    [provider]  diclofenac  (FLECTOR ) 1.3 % PTCH Place 1 patch onto the skin 2 (two) times daily. 11/23/22   Garrick Charleston, MD  diclofenac  Sodium (VOLTAREN ) 1 % GEL Apply 2 g topically 4 (four) times daily. 11/24/20   Long, Joshua G, MD  docusate sodium  (COLACE) 100 MG capsule Take 100 mg by mouth 2 (two) times daily.    [provider]  fish oil-omega-3 fatty acids 1000 MG capsule Take 1 g by mouth 2 (two) times daily.     [provider]  gabapentin  (NEURONTIN ) 300 MG capsule Take 300 mg by mouth 2 (two) times daily.     [provider]  glipiZIDE (GLUCOTROL) 10 MG tablet Take 10 mg by mouth 2 (two) times daily before a meal.    [provider]  HYDROcodone -acetaminophen  (NORCO/VICODIN) 5-325 MG tablet Take 1 tablet by mouth every 6 (six) hours as needed for severe pain. 11/23/22   Garrick Charleston, MD  insulin  glargine (LANTUS ) 100 units/mL SOLN Inject 32 Units into the skin daily at 10 pm.     [provider]  lisinopril  (PRINIVIL ,ZESTRIL ) 40 MG tablet Take 0.5 tablets (20 mg total) by mouth daily. Patient taking differently: Take 40 mg by mouth daily.  10/25/18   Will Almarie MATSU, MD  nicotine (NICODERM CQ - DOSED IN MG/24 HR) 7 mg/24hr patch Place 7 mg onto the skin daily as needed (nicotine addiction).  [provider]  pantoprazole  (PROTONIX ) 40 MG tablet Take 40 mg by mouth daily.    [provider]  terazosin (HYTRIN) 5 MG capsule Take 5 mg by mouth at bedtime.    [provider]     ___________________________________________________________________________________________________ Physical Exam:    04/30/2024    8:00 PM 04/30/2024    7:45 PM 04/30/2024    5:00 PM  Vitals with BMI  Systolic 151 145 92  Diastolic 86 46 60  Pulse 77 72 89     1. General:  in No  Acute distress   Chronically ill   -appearing 2. Psychological: Alert and   Oriented 3. Head/ENT:    Dry Mucous Membranes                          Head Non traumatic, neck supple                      Poor Dentition 4. SKIN:  decreased Skin turgor,  Skin clean Dry and intact no rash    5. Heart: Regular rate and rhythm no*** Murmur, no Rub or gallop 6. Lungs: ***Clear to auscultation bilaterally, no wheezes or crackles   7. Abdomen: Soft, ***non-tender, Non distended *** obese ***bowel sounds present 8. Lower extremities: no clubbing, cyanosis, no ***edema 9. Neurologically Grossly intact, moving all 4 extremities equally *** strength 5 out of 5 in all 4 extremities cranial nerves II through XII intact 10. MSK: Normal range of motion    Chart has been reviewed  ______________________________________________________________________________________________  Assessment/Plan 73 y.o. male with medical history significant of thrombocytopenia, DM2, compression fractures, GERD, HTN HLD  Admitted for   Syncope and collapse, COPD exacerbation     Present on Admission:  Syncope     No problem-specific Assessment & Plan notes found for this encounter.    Other plan as per orders.  DVT prophylaxis:  SCD    Code Status:    Code Status: Full Code FULL CODE   as per patient  I had personally discussed CODE STATUS with patient   ACP   none  Family Communication:   Family not at  Bedside    Diet  Diet Orders (From admission, onward)     Start     Ordered   04/30/24 1702  Diet NPO time specified  Diet effective now        04/30/24 1702            Disposition Plan:         To home once  workup is complete and patient is stable   Following barriers for discharge:                          Syncope  work up is complete                                  Consult Orders  (From admission, onward)           Start     Ordered   05/01/24 2321  Nutritional services consult  Once       Provider:  (Not yet assigned)  Question:  Reason for Consult?  Answer:  assess nutrtional status   04/30/24 2321   04/30/24 2252  Consult to hospitalist  Once  Provider:  (Not yet assigned)  Question Answer Comment  Place call to: [facility]   Reason for Consult Admit      04/30/24 2251                               Would benefit from PT/OT eval prior to DC  Ordered                                       Consults called:    NONE   Admission status:  ED Disposition     ED Disposition  Admit   Condition  --   Comment  Hospital Area: [facility] [[id number]]  Level of Care: Telemetry [5]  Admit to tele based on following criteria: Other see comments  Comments: syncope  May place patient in observation at [facility] or [facility] if equivalent level of care is available:: No  Covid Evaluation: Asymptomatic - no recent exposure (last 10 days) testing not required  Diagnosis: Syncope [206001]  Admitting Physician: Avanti Jetter [[id number]]  Attending Physician: Syre Knerr [[id number]]  For patients discharging to extended facilities (i.e. SNF, AL, group homes or LTAC) initiate:: Discharge to SNF/Facility Placement COVID-19 Lab Testing Protocol           Obs    Level of care     tele  For  24H    Dee Maday 04/30/2024, 11:43 PM    [facility]     after 2 AM please page floor coverage   If 7AM-7PM, please contact the day team taking care of the patient using [email or web address]

## 2024-04-30 NOTE — Subjective & Objective (Signed)
 2 episodes of syncope today, fell in hte bathroom, today was reaching for something in the bathroom and fell Some lacerations, did not need fixing  Had prior syncope  TSH WNL Neck sore  Known CKD stable Known thrombocytopenia

## 2024-04-30 NOTE — ED Provider Notes (Signed)
 [place] EMERGENCY DEPARTMENT AT [facility] Provider Note   CSN: [id number] Arrival date & time: 04/30/24  1631     Patient presents with: Fall and Loss of Consciousness   Justin Clayton is a 73 y.o. male.  He is here with a complaint of 2 falls possible syncopal event today.  He said the first 1 was in the bathroom after he went to the bathroom and the second was in the kitchen while he was reaching for something.  He has a laceration just lateral to his left eye along with pain in his left shoulder and his right knee.  He said he has had a syncopal event before but not 2 in a row.  He has no recall of the incidents and no preceding symptoms just, just wakes up on the floor.  No cardiac symptoms.  {Add pertinent medical, surgical, social history, OB history to YEP:67052} The history is provided by the patient.  Loss of Consciousness Episode history:  Multiple Most recent episode:  Today Progression:  Unchanged Chronicity:  New Context: normal activity   Witnessed: no   Associated symptoms: no chest pain, no difficulty breathing, no fever, no nausea, no shortness of breath and no vomiting        Prior to Admission medications   Medication Sig Start Date End Date Taking? Authorizing Provider  acetaminophen  (TYLENOL ) 325 MG tablet Take 650 mg by mouth daily as needed for moderate pain.     [provider]  acyclovir  (ZOVIRAX ) 800 MG tablet Take 400 mg by mouth daily. Taking 1/2 of 800mg     [provider]  albuterol  (PROVENTIL  HFA;VENTOLIN  HFA) 108 (90 BASE) MCG/ACT inhaler Inhale 2 puffs into the lungs every 4 (four) hours as needed. For shortness of breath    [provider]  aspirin  EC 81 MG tablet Take 81 mg by mouth daily.    [provider]  atorvastatin  (LIPITOR) 80 MG tablet Take 80 mg by mouth every evening.     [provider]  Cholecalciferol (VITAMIN D-3) 1000 UNITS CAPS Take 1 capsule by mouth daily.      [provider]  Colloidal Oatmeal (GOLD BOND ECZEMA RELIEF) 2 % CREA Apply 1 application topically as needed (dry skin).    [provider]  diclofenac  (FLECTOR ) 1.3 % PTCH Place 1 patch onto the skin 2 (two) times daily. 11/23/22   Garrick Charleston, MD  diclofenac  Sodium (VOLTAREN ) 1 % GEL Apply 2 g topically 4 (four) times daily. 11/24/20   Long, Joshua G, MD  docusate sodium  (COLACE) 100 MG capsule Take 100 mg by mouth 2 (two) times daily.    [provider]  fish oil-omega-3 fatty acids 1000 MG capsule Take 1 g by mouth 2 (two) times daily.     [provider]  gabapentin  (NEURONTIN ) 300 MG capsule Take 300 mg by mouth 2 (two) times daily.     [provider]  glipiZIDE (GLUCOTROL) 10 MG tablet Take 10 mg by mouth 2 (two) times daily before a meal.    [provider]  HYDROcodone -acetaminophen  (NORCO/VICODIN) 5-325 MG tablet Take 1 tablet by mouth every 6 (six) hours as needed for severe pain. 11/23/22   Garrick Charleston, MD  insulin  glargine (LANTUS ) 100 units/mL SOLN Inject 32 Units into the skin daily at 10 pm.     [provider]  lisinopril  (PRINIVIL ,ZESTRIL ) 40 MG tablet Take 0.5 tablets (20 mg total) by mouth daily. Patient taking differently: Take 40  mg by mouth daily.  10/25/18   Will Almarie MATSU, MD  nicotine (NICODERM CQ - DOSED IN MG/24 HR) 7 mg/24hr patch Place 7 mg onto the skin daily as needed (nicotine addiction).    [provider]  pantoprazole  (PROTONIX ) 40 MG tablet Take 40 mg by mouth daily.    [provider]  terazosin (HYTRIN) 5 MG capsule Take 5 mg by mouth at bedtime.    [provider]    Allergies: Patient has no known allergies.    Review of Systems  Constitutional:  Negative for fever.  Respiratory:  Negative for shortness of breath.   Cardiovascular:  Positive for syncope. Negative for chest pain.  Gastrointestinal:  Negative for nausea and vomiting.    Updated Vital  Signs BP 92/60   Pulse 89   Temp 98.4 F (36.9 C) (Oral)   Resp 18   SpO2 100%   Physical Exam Vitals and nursing note reviewed.  Constitutional:      Appearance: Normal appearance. He is well-developed.  HENT:     Head: Normocephalic.     Comments: He has approximately 1 cm laceration just lateral to his left eye Eyes:     Conjunctiva/sclera: Conjunctivae normal.  Cardiovascular:     Rate and Rhythm: Normal rate and regular rhythm.     Heart sounds: No murmur heard. Pulmonary:     Effort: Pulmonary effort is normal. No respiratory distress.     Breath sounds: Normal breath sounds.  Abdominal:     Palpations: Abdomen is soft.     Tenderness: There is no abdominal tenderness. There is no guarding or rebound.  Musculoskeletal:        General: Tenderness present.     Cervical back: Neck supple.     Comments: He has some anterior left shoulder tenderness.  Well-healed surgical scar.  Distal motor and sensation intact.  He has some diffuse tenderness over his right knee.  Full range of motion.  Otherwise no tenderness.  Skin:    General: Skin is warm and dry.  Neurological:     General: No focal deficit present.     Mental Status: He is alert and oriented to person, place, and time.     GCS: GCS eye subscore is 4. GCS verbal subscore is 5. GCS motor subscore is 6.     Sensory: No sensory deficit.     Motor: No weakness.     (all labs ordered are listed, but only abnormal results are displayed) Labs Reviewed  COMPREHENSIVE METABOLIC PANEL WITH GFR - Abnormal; Notable for the following components:      Result Value   Glucose, Bld 140 (*)    BUN 26 (*)    Creatinine, Ser 1.78 (*)    Calcium  8.6 (*)    Albumin  3.2 (*)    GFR, Estimated 40 (*)    All other components within normal limits  CBC - Abnormal; Notable for the following components:   RBC 3.70 (*)    Hemoglobin 11.4 (*)    HCT 34.7 (*)    Platelets 91 (*)    All other components within normal limits  CBG  MONITORING, ED - Abnormal; Notable for the following components:   Glucose-Capillary 139 (*)    All other components within normal limits  URINALYSIS, ROUTINE W REFLEX MICROSCOPIC  TSH    EKG: EKG Interpretation Date/Time:  Monday April 30 2024 17:06:40 EDT Ventricular Rate:  89 PR Interval:  142 QRS Duration:  88  QT Interval:  330 QTC Calculation: 402 R Axis:   78  Text Interpretation: Sinus rhythm No significant change since prior 6/24 Confirmed by Towana Sharper [phone]) on 04/30/2024 6:25:53 PM  Radiology: No results found.  {Document cardiac monitor, telemetry assessment procedure when appropriate:32947} Procedures   Medications Ordered in the ED - No data to display    {Click here for ABCD2, HEART and other calculators REFRESH Note before signing:1}                              Medical Decision Making Amount and/or Complexity of Data Reviewed Labs: ordered. Radiology: ordered.   This patient complains of ***; this involves an extensive number of treatment Options and is a complaint that carries with it a high risk of complications and morbidity. The differential includes ***  I ordered, reviewed and interpreted labs, which included *** I ordered medication *** and reviewed PMP when indicated. I ordered imaging studies which included *** and I independently    visualized and interpreted imaging which showed *** Additional history obtained from *** Previous records obtained and reviewed *** I consulted *** and discussed lab and imaging findings and discussed disposition.  Cardiac monitoring reviewed, *** Social determinants considered, *** Critical Interventions: ***  After the interventions stated above, I reevaluated the patient and found *** Admission and further testing considered, ***   {Document critical care time when appropriate  Document review of labs and clinical decision tools ie CHADS2VASC2, etc  Document your independent review of radiology  images and any outside records  Document your discussion with family members, caretakers and with consultants  Document social determinants of health affecting pt's care  Document your decision making why or why not admission, treatments were needed:32947:::1}   Final diagnoses:  None    ED Discharge Orders     None

## 2024-05-01 ENCOUNTER — Observation Stay (HOSPITAL_COMMUNITY)

## 2024-05-01 DIAGNOSIS — E860 Dehydration: Secondary | ICD-10-CM | POA: Diagnosis present

## 2024-05-01 DIAGNOSIS — Z7982 Long term (current) use of aspirin: Secondary | ICD-10-CM | POA: Diagnosis not present

## 2024-05-01 DIAGNOSIS — Z8249 Family history of ischemic heart disease and other diseases of the circulatory system: Secondary | ICD-10-CM | POA: Diagnosis not present

## 2024-05-01 DIAGNOSIS — I358 Other nonrheumatic aortic valve disorders: Secondary | ICD-10-CM | POA: Diagnosis present

## 2024-05-01 DIAGNOSIS — D696 Thrombocytopenia, unspecified: Secondary | ICD-10-CM | POA: Diagnosis present

## 2024-05-01 DIAGNOSIS — Z794 Long term (current) use of insulin: Secondary | ICD-10-CM | POA: Diagnosis not present

## 2024-05-01 DIAGNOSIS — J40 Bronchitis, not specified as acute or chronic: Secondary | ICD-10-CM

## 2024-05-01 DIAGNOSIS — R55 Syncope and collapse: Secondary | ICD-10-CM | POA: Diagnosis present

## 2024-05-01 DIAGNOSIS — E1122 Type 2 diabetes mellitus with diabetic chronic kidney disease: Secondary | ICD-10-CM | POA: Diagnosis present

## 2024-05-01 DIAGNOSIS — Z7984 Long term (current) use of oral hypoglycemic drugs: Secondary | ICD-10-CM | POA: Diagnosis not present

## 2024-05-01 DIAGNOSIS — E7800 Pure hypercholesterolemia, unspecified: Secondary | ICD-10-CM | POA: Diagnosis present

## 2024-05-01 DIAGNOSIS — I129 Hypertensive chronic kidney disease with stage 1 through stage 4 chronic kidney disease, or unspecified chronic kidney disease: Secondary | ICD-10-CM | POA: Diagnosis present

## 2024-05-01 DIAGNOSIS — Z811 Family history of alcohol abuse and dependence: Secondary | ICD-10-CM | POA: Diagnosis not present

## 2024-05-01 DIAGNOSIS — R5383 Other fatigue: Secondary | ICD-10-CM | POA: Diagnosis present

## 2024-05-01 DIAGNOSIS — J449 Chronic obstructive pulmonary disease, unspecified: Secondary | ICD-10-CM | POA: Diagnosis present

## 2024-05-01 DIAGNOSIS — N1832 Chronic kidney disease, stage 3b: Secondary | ICD-10-CM | POA: Diagnosis present

## 2024-05-01 DIAGNOSIS — R5381 Other malaise: Secondary | ICD-10-CM | POA: Diagnosis present

## 2024-05-01 DIAGNOSIS — Z1152 Encounter for screening for COVID-19: Secondary | ICD-10-CM | POA: Diagnosis not present

## 2024-05-01 DIAGNOSIS — F17210 Nicotine dependence, cigarettes, uncomplicated: Secondary | ICD-10-CM | POA: Diagnosis present

## 2024-05-01 DIAGNOSIS — I951 Orthostatic hypotension: Secondary | ICD-10-CM | POA: Diagnosis present

## 2024-05-01 HISTORY — DX: Bronchitis, not specified as acute or chronic: J40

## 2024-05-01 LAB — PHOSPHORUS
Phosphorus: 2.9 mg/dL (ref 2.5–4.6)
Phosphorus: 3.1 mg/dL (ref 2.5–4.6)

## 2024-05-01 LAB — RESP PANEL BY RT-PCR (RSV, FLU A&B, COVID)  RVPGX2
Influenza A by PCR: NEGATIVE
Influenza B by PCR: NEGATIVE
Resp Syncytial Virus by PCR: NEGATIVE
SARS Coronavirus 2 by RT PCR: NEGATIVE

## 2024-05-01 LAB — COMPREHENSIVE METABOLIC PANEL WITH GFR
ALT: 13 U/L (ref 0–44)
AST: 17 U/L (ref 15–41)
Albumin: 2.6 g/dL — ABNORMAL LOW (ref 3.5–5.0)
Alkaline Phosphatase: 40 U/L (ref 38–126)
Anion gap: 11 (ref 5–15)
BUN: 21 mg/dL (ref 8–23)
CO2: 22 mmol/L (ref 22–32)
Calcium: 8.2 mg/dL — ABNORMAL LOW (ref 8.9–10.3)
Chloride: 105 mmol/L (ref 98–111)
Creatinine, Ser: 1.23 mg/dL (ref 0.61–1.24)
GFR, Estimated: >60 mL/min (ref >60)
Glucose, Bld: 129 mg/dL — ABNORMAL HIGH (ref 70–99)
Potassium: 4 mmol/L (ref 3.5–5.1)
Sodium: 138 mmol/L (ref 135–145)
Total Bilirubin: 0.6 mg/dL (ref 0.0–1.2)
Total Protein: 6.5 g/dL (ref 6.5–8.1)

## 2024-05-01 LAB — MAGNESIUM
Magnesium: 1.7 mg/dL (ref 1.7–2.4)
Magnesium: 1.7 mg/dL (ref 1.7–2.4)

## 2024-05-01 LAB — CBC
HCT: 32 % — ABNORMAL LOW (ref 39.0–52.0)
Hemoglobin: 10.9 g/dL — ABNORMAL LOW (ref 13.0–17.0)
MCH: 31.1 pg (ref 26.0–34.0)
MCHC: 34.1 g/dL (ref 30.0–36.0)
MCV: 91.2 fL (ref 80.0–100.0)
Platelets: 85 K/uL — ABNORMAL LOW (ref 150–400)
RBC: 3.51 MIL/uL — ABNORMAL LOW (ref 4.22–5.81)
RDW: 12 % (ref 11.5–15.5)
WBC: 8 K/uL (ref 4.0–10.5)
nRBC: 0 % (ref 0.0–0.2)

## 2024-05-01 LAB — GLUCOSE, CAPILLARY
Glucose-Capillary: 140 mg/dL — ABNORMAL HIGH (ref 70–99)
Glucose-Capillary: 105 mg/dL — ABNORMAL HIGH (ref 70–99)
Glucose-Capillary: 177 mg/dL — ABNORMAL HIGH (ref 70–99)
Glucose-Capillary: 235 mg/dL — ABNORMAL HIGH (ref 70–99)
Glucose-Capillary: 143 mg/dL — ABNORMAL HIGH (ref 70–99)
Glucose-Capillary: 154 mg/dL — ABNORMAL HIGH (ref 70–99)

## 2024-05-01 LAB — ECHOCARDIOGRAM COMPLETE
Area-P 1/2: 4.41 cm2
Calc EF: 56.6 %
Height: 74 in
S' Lateral: 2.5 cm
Single Plane A2C EF: 57.2 %
Single Plane A4C EF: 55.9 %
Weight: 3009.6 [oz_av]

## 2024-05-01 LAB — BLOOD GAS, VENOUS
Acid-base deficit: 0.4 mmol/L (ref 0.0–2.0)
Bicarbonate: 24.8 mmol/L (ref 20.0–28.0)
Drawn by: 8158
O2 Saturation: 73.5 %
Patient temperature: 37
pCO2, Ven: 42 mmHg — ABNORMAL LOW (ref 44–60)
pH, Ven: 7.38 (ref 7.25–7.43)
pO2, Ven: 43 mmHg (ref 32–45)

## 2024-05-01 LAB — TROPONIN I (HIGH SENSITIVITY)
Troponin I (High Sensitivity): 6 ng/L (ref <18)
Troponin I (High Sensitivity): 6 ng/L (ref <18)

## 2024-05-01 LAB — CK: Total CK: 96 U/L (ref 49–397)

## 2024-05-01 LAB — CBG MONITORING, ED: Glucose-Capillary: 131 mg/dL — ABNORMAL HIGH (ref 70–99)

## 2024-05-01 LAB — PREALBUMIN: Prealbumin: 10 mg/dL — ABNORMAL LOW (ref 18–38)

## 2024-05-01 MED ORDER — ENSURE PLUS HIGH PROTEIN PO LIQD
237.0000 mL | Freq: Two times a day (BID) | ORAL | Status: DC
  Administered 2024-05-01 – 2024-05-03 (×5): 237 mL via ORAL

## 2024-05-01 MED ORDER — ACETAMINOPHEN 650 MG RE SUPP: 650.0000 mg | Freq: Four times a day (QID) | RECTAL | Status: DC | PRN

## 2024-05-01 MED ORDER — PANTOPRAZOLE SODIUM 40 MG PO TBEC
40.0000 mg | DELAYED_RELEASE_TABLET | Freq: Every day | ORAL | Status: DC
Start: 2024-05-01 — End: 2024-05-03
  Administered 2024-05-01 – 2024-05-03 (×3): 40 mg via ORAL
  Filled 2024-05-01 (×3): qty 1

## 2024-05-01 MED ORDER — ASPIRIN 81 MG PO TBEC
81.0000 mg | DELAYED_RELEASE_TABLET | Freq: Every day | ORAL | Status: DC
  Administered 2024-05-01 – 2024-05-03 (×3): 81 mg via ORAL
  Filled 2024-05-01 (×3): qty 1

## 2024-05-01 MED ORDER — ACETAMINOPHEN 325 MG PO TABS: 650.0000 mg | ORAL_TABLET | Freq: Four times a day (QID) | ORAL | Status: DC | PRN

## 2024-05-01 MED ORDER — SODIUM CHLORIDE 0.9 % IV SOLN: INTRAVENOUS | Status: AC

## 2024-05-01 MED ORDER — ATORVASTATIN CALCIUM 40 MG PO TABS
80.0000 mg | ORAL_TABLET | Freq: Every evening | ORAL | Status: DC
Start: 2024-05-01 — End: 2024-05-03
  Administered 2024-05-01 – 2024-05-02 (×2): 80 mg via ORAL
  Filled 2024-05-01 (×2): qty 2

## 2024-05-01 MED ORDER — INSULIN GLARGINE-YFGN 100 UNIT/ML ~~LOC~~ SOLN
15.0000 [IU] | Freq: Every day | SUBCUTANEOUS | Status: DC
  Administered 2024-05-01 – 2024-05-02 (×3): 15 [IU] via SUBCUTANEOUS
  Filled 2024-05-01 (×4): qty 0.15

## 2024-05-01 MED ORDER — GABAPENTIN 400 MG PO CAPS
400.0000 mg | ORAL_CAPSULE | Freq: Two times a day (BID) | ORAL | Status: DC
  Administered 2024-05-01 – 2024-05-03 (×6): 400 mg via ORAL
  Filled 2024-05-01 (×6): qty 1

## 2024-05-01 MED ORDER — SODIUM CHLORIDE 0.9 % IV SOLN: INTRAVENOUS | Status: DC

## 2024-05-01 MED ORDER — ALBUTEROL SULFATE (2.5 MG/3ML) 0.083% IN NEBU
2.5000 mg | INHALATION_SOLUTION | RESPIRATORY_TRACT | Status: DC | PRN
  Administered 2024-05-01 (×2): 2.5 mg via RESPIRATORY_TRACT
  Filled 2024-05-01 (×2): qty 3

## 2024-05-01 MED ORDER — ONDANSETRON HCL 4 MG PO TABS: 4.0000 mg | ORAL_TABLET | Freq: Four times a day (QID) | ORAL | Status: DC | PRN

## 2024-05-01 MED ORDER — HYDROCOD POLI-CHLORPHE POLI ER 10-8 MG/5ML PO SUER
5.0000 mL | Freq: Two times a day (BID) | ORAL | Status: DC
  Administered 2024-05-01 – 2024-05-03 (×5): 5 mL via ORAL
  Filled 2024-05-01 (×5): qty 115

## 2024-05-01 MED ORDER — ONDANSETRON HCL 4 MG/2ML IJ SOLN: 4.0000 mg | Freq: Four times a day (QID) | INTRAMUSCULAR | Status: DC | PRN

## 2024-05-01 NOTE — Evaluation (Signed)
 Physical Therapy Evaluation Patient Details Name: Justin Clayton MRN: [id number] DOB: [birth year] Today's Date: 05/01/2024  History of Present Illness  Pt is a 73 yr old male who presented 04/30/24 due to a fall and LOC. Xray and CT negative. PMH: thrombocytopenia, DM2, compression fractures, GERD, HTN HLD  Clinical Impression  Patient presents with decreased mobility due to limited activity tolerance and generalized weakness with recent falls at home.  Previously independent using cane vs rollator in the home and completing driving and other IADL's.  He was able to stand with S though developed pre-syncopal symptoms (tremor and leaning forward) with positive orthostatic BP (as below).  Able to complete sit to stand x 2 more sessions then stepped back to bed.  Feel he will benefit from skilled PT in the acute setting and likely will not need follow up PT once symptoms resolve.    Orthostatic VS for the past 24 hrs (Last 3 readings):  BP- Lying Pulse- Lying BP- Sitting Pulse- Sitting BP- Standing at 0 minutes Pulse- Standing at 0 minutes  05/01/24 1000 113/65 102 124/63 105 (!) 89/59 108         If plan is discharge home, recommend the following: A little help with walking and/or transfers;Help with stairs or ramp for entrance   Can travel by private vehicle        Equipment Recommendations None recommended by PT  Recommendations for Other Services       Functional Status Assessment Patient has had a recent decline in their functional status and demonstrates the ability to make significant improvements in function in a reasonable and predictable amount of time.     Precautions / Restrictions Precautions Precautions: Fall Recall of Precautions/Restrictions: Intact Precaution/Restrictions Comments: falls at home related to syncope/weakness      Mobility  Bed Mobility Overal bed mobility: Modified Independent             General bed mobility comments: sit to supine     Transfers Overall transfer level: Needs assistance Equipment used: None, Rollator (4 wheels) Transfers: Sit to/from Stand, Bed to chair/wheelchair/BSC Sit to Stand: Supervision, Contact guard assist   Step pivot transfers: Contact guard assist       General transfer comment: initial sit to stand S then after pre-syncopal episode standing provided CGA for standing second time then during step pivot to bed for safety    Ambulation/Gait                  Stairs            Wheelchair Mobility     Tilt Bed    Modified Rankin (Stroke Patients Only)       Balance Overall balance assessment: Needs assistance   Sitting balance-Leahy Scale: Good     Standing balance support: Bilateral upper extremity supported Standing balance-Leahy Scale: Poor Standing balance comment: UE support for safety due to pre-syncopal symptoms                             Pertinent Vitals/Pain Pain Assessment Pain Assessment: No/denies pain    Home Living Family/patient expects to be discharged to:: Private residence Living Arrangements: Non-relatives/Friends Available Help at Discharge: Friend(s) Type of Home: House Home Access: Level entry       Home Layout: One level Home Equipment: Rollator (4 wheels);Cane - single point;Shower seat;Grab bars - tub/shower      Prior Function Prior Level of Function : Independent/Modified  Independent;Driving             Mobility Comments: SPC vs 4WW ADLs Comments: indep     Extremity/Trunk Assessment   Upper Extremity Assessment Upper Extremity Assessment: Overall WFL for tasks assessed    Lower Extremity Assessment Lower Extremity Assessment: Overall WFL for tasks assessed    Cervical / Trunk Assessment Cervical / Trunk Assessment: Kyphotic  Communication   Communication Communication: No apparent difficulties    Cognition Arousal: Alert Behavior During Therapy: WFL for tasks assessed/performed,  Lability   PT - Cognitive impairments: No apparent impairments                         Following commands: Intact       Cueing Cueing Techniques: Verbal cues     General Comments General comments (skin integrity, edema, etc.): see flowsheet for orthostatic vitals, pt with symptoms in standing (tremor, leaning over) cues and assist to sit safely    Exercises Other Exercises Other Exercises: sit<>stand x 3   Assessment/Plan    PT Assessment Patient needs continued PT services  PT Problem List Decreased strength;Decreased activity tolerance;Decreased mobility;Cardiopulmonary status limiting activity       PT Treatment Interventions DME instruction;Gait training;Functional mobility training;Therapeutic activities;Therapeutic exercise;Patient/family education    PT Goals (Current goals can be found in the Care Plan section)  Acute Rehab PT Goals Patient Stated Goal: to get stronger, independent PT Goal Formulation: With patient Time For Goal Achievement: 05/15/24 Potential to Achieve Goals: Good    Frequency Min 2X/week     Co-evaluation               AM-PAC PT 6 Clicks Mobility  Outcome Measure Help needed turning from your back to your side while in a flat bed without using bedrails?: A Little Help needed moving from lying on your back to sitting on the side of a flat bed without using bedrails?: A Little Help needed moving to and from a bed to a chair (including a wheelchair)?: A Little Help needed standing up from a chair using your arms (e.g., wheelchair or bedside chair)?: A Little Help needed to walk in hospital room?: Total Help needed climbing 3-5 steps with a railing? : Total 6 Click Score: 14    End of Session   Activity Tolerance: Treatment limited secondary to medical complications (Comment) Patient left: in bed;with call bell/phone within reach;with bed alarm set   PT Visit Diagnosis: Difficulty in walking, not elsewhere classified  (R26.2);History of falling (Z91.81)    Time: 9040-8979 PT Time Calculation (min) (ACUTE ONLY): 21 min   Charges:   PT Evaluation $PT Eval Moderate Complexity: 1 Mod   PT General Charges $$ ACUTE PT VISIT: 1 Visit         Micheline Portal, PT Acute Rehabilitation Services Office:[phone] 05/01/2024   Montie Portal 05/01/2024, 10:50 AM

## 2024-05-01 NOTE — Evaluation (Signed)
 Occupational Therapy Evaluation Patient Details Name: Justin Clayton MRN: [id number] DOB: 1951-04-23 Today's Date: 05/01/2024   History of Present Illness   Pt is a 73 yr old male who presented 04/30/24 due to a fall and LOC. Xray and CT negative. PMH: thrombocytopenia, DM2, compression fractures, GERD, HTN HLD     Clinical Impressions Pt reported at PLOF they used a SPC vs 4WW depending on how they were feeling. He was indep in IADLS/ADLS and driving. At this time pt was limited due to BP in session. Pt also when sitting at EOB started to lean anterior and stopped talking. He reported I do that sometimes. At this time required CGA for ADLS but was only able to take 2 side steps with CGA. Pt was placed back into bed in the chair position and nursing was made aware.   BP: Supine:106/66 Sitting:102/70 Standing: 78/53 Sitting: 115/74 (82)      If plan is discharge home, recommend the following:   A little help with walking and/or transfers;A little help with bathing/dressing/bathroom;Assistance with cooking/housework;Assist for transportation     Functional Status Assessment   Patient has had a recent decline in their functional status and demonstrates the ability to make significant improvements in function in a reasonable and predictable amount of time.     Equipment Recommendations   Tub/shower seat     Recommendations for Other Services         Precautions/Restrictions   Precautions Precautions: Fall Recall of Precautions/Restrictions: Impaired Precaution/Restrictions Comments: multiple falls and is unaware about when sitting at EOB changes in posture/awareness     Mobility Bed Mobility Overal bed mobility: Modified Independent             General bed mobility comments: cued on pacing due to BP    Transfers Overall transfer level: Needs assistance Equipment used: None Transfers: Sit to/from Stand Sit to Stand: Contact guard assist                   Balance Overall balance assessment: Needs assistance Sitting-balance support: Feet supported Sitting balance-Leahy Scale: Fair Sitting balance - Comments: as started to lean forward and was unaware   Standing balance support: No upper extremity supported Standing balance-Leahy Scale: Fair Standing balance comment: limited stadning and balance due to bp                           ADL either performed or assessed with clinical judgement   ADL Overall ADL's : Needs assistance/impaired Eating/Feeding: Independent;Sitting   Grooming: Oral care;Set up;Sitting   Upper Body Bathing: Sitting;Contact guard assist   Lower Body Bathing: Contact guard assist;Sit to/from stand   Upper Body Dressing : Sitting;Contact guard assist   Lower Body Dressing: Contact guard assist;Sit to/from stand;Sitting/lateral leans               Functional mobility during ADLs: Contact guard assist General ADL Comments: only side step to [facility]     Vision Baseline Vision/History: 1 Wears glasses Ability to See in Adequate Light: 0 Adequate Patient Visual Report: No change from baseline       Perception         Praxis         Pertinent Vitals/Pain Pain Assessment Pain Assessment: No/denies pain     Extremity/Trunk Assessment Upper Extremity Assessment Upper Extremity Assessment: Overall WFL for tasks assessed   Lower Extremity Assessment Lower Extremity Assessment: Defer to PT evaluation   Cervical /  Trunk Assessment Cervical / Trunk Assessment: Kyphotic   Communication Communication Communication: No apparent difficulties   Cognition Arousal: Alert Behavior During Therapy: WFL for tasks assessed/performed Cognition: No apparent impairments                               Following commands: Intact       Cueing  General Comments   Cueing Techniques: Verbal cues      Exercises     Shoulder Instructions      Home Living Family/patient  expects to be discharged to:: Private residence Living Arrangements: Non-relatives/Friends Available Help at Discharge: Friend(s) Type of Home: House Home Access: Level entry     Home Layout: One level     Bathroom Shower/Tub: Producer, television/film/video: Standard     Home Equipment: Rollator (4 wheels);Cane - single point          Prior Functioning/Environment Prior Level of Function : Independent/Modified Independent;Driving             Mobility Comments: SPC vs 4WW ADLs Comments: indep    OT Problem List: Decreased strength;Impaired balance (sitting and/or standing);Decreased knowledge of use of DME or AE;Decreased safety awareness   OT Treatment/Interventions: Self-care/ADL training;Therapeutic exercise;DME and/or AE instruction;Therapeutic activities;Patient/family education;Balance training      OT Goals(Current goals can be found in the care plan section)   Acute Rehab OT Goals Patient Stated Goal: to get this figured out OT Goal Formulation: With patient Time For Goal Achievement: 05/15/24 Potential to Achieve Goals: Good   OT Frequency:  Min 2X/week    Co-evaluation              AM-PAC OT 6 Clicks Daily Activity     Outcome Measure Help from another person eating meals?: None Help from another person taking care of personal grooming?: A Little Help from another person toileting, which includes using toliet, bedpan, or urinal?: A Little Help from another person bathing (including washing, rinsing, drying)?: A Little Help from another person to put on and taking off regular upper body clothing?: None Help from another person to put on and taking off regular lower body clothing?: A Little 6 Click Score: 20   End of Session Equipment Utilized During Treatment: Gait belt Nurse Communication: Mobility status  Activity Tolerance: Other (comment) (bp) Patient left: in bed;with call bell/phone within reach;with bed alarm set  OT Visit  Diagnosis: Unsteadiness on feet (R26.81);Repeated falls (R29.6);Muscle weakness (generalized) (M62.81)                Time: 9174-9155 OT Time Calculation (min): 19 min Charges:  OT General Charges $OT Visit: 1 Visit OT Evaluation $OT Eval Low Complexity: 1 Low  Warrick POUR OTR/L  Acute Rehab Services  [phone] office number   Warrick Berber 05/01/2024, 8:52 AM

## 2024-05-01 NOTE — Progress Notes (Signed)
 PROGRESS NOTE  Justin Clayton  FMW:[id number] DOB: 03/14/1951 DOA: 04/30/2024 PCP: System, Provider Not In   Brief Narrative: Patient is a 49 male with history of thrombocytopenia, diabetes type 2, compression fracture, GERD, hypertension, hyperlipidemia who presented with syncopal episodes twice at home.  He fell on the bathroom floor.  Also reported having cough and sore throat for the past week, decreased oral intake and was feeling tired and weak.  His roommate was also sick.  Patient admitted for further syncopal workup.  Echo pending.  Found to be orthostatic this morning, started on IV fluids  Assessment & Plan:  Principal Problem:   Syncope Active Problems:   HLD (hyperlipidemia)   DM2 (diabetes mellitus, type 2) (HCC)   Essential hypertension   Thrombocytopenia (HCC)   Debility   Orthostasis   Left atrial mass   Bronchitis   Syncopal episodes:-Twice at home, fell on the bathroom.  Lives with a roommate.  Was feeling weak and was having poor oral intake since last few days.  Found to be orthostatic while being evaluated with OT today.  Started on IV fluid.  Echo pending.  Last echo in 2022 was suspicious for left atrial mass. PT will see him today.  Currently remains hemodynamically stable. X-ray of the right knee, left shoulder and cervical CT/head CT did not show any acute findings  Suspected bronchitis: Was having cough and sore throat since last few days.  Respiratory viral panel, influenza, COVID-negative.  X-ray does not show pneumonia.  Holding steroid.  Lungs are clear to auscultation but he has dry cough.  Currently on ceftriaxone .  Continue cough medication  Thrombocytopenia: Chronic and stable  History of left atrial mass: As per echo in 2022.  Follow-up new echo  Diabetes type 2: Continue current insulin  regimen.  Monitor blood sugars  Hypertension: Home medications on hold.  Hyperlipidemia: On Lipitor        DVT prophylaxis:SCDs Start: 05/01/24  0105     Code Status: Full Code  Family Communication: None at the bedside  Patient status:Obs  Patient is from :Home  Anticipated discharge un:Ynfz  Estimated DC date: 1 to 2 days   Consultants: None   Procedures:None  Antimicrobials:  Anti-infectives (From admission, onward)    Start     Dose/Rate Route Frequency Ordered Stop   04/30/24 2345  cefTRIAXone  (ROCEPHIN ) 1 g in sodium chloride  0.9 % 100 mL IVPB        1 g 200 mL/hr over 30 Minutes Intravenous Every 24 hours 04/30/24 2339         Subjective: Patient seen and examined at bedside today.  Hemodynamically stable.  Lying on the bed.  Has some dry cough.  On room air.  Did not complain of shortness of breath.  He was orthostatic while working with OT.  Currently on fluids.  Denies any abdomen pain, nausea or vomiting.  Objective: Vitals:   05/01/24 0424 05/01/24 0425 05/01/24 0826 05/01/24 0836  BP: (!) 85/55 106/61 106/66 115/74  Pulse: 90  87 98  Resp: 18  20   Temp: 99.4 F (37.4 C)  99.4 F (37.4 C)   TempSrc: Oral  Oral   SpO2: 100%  100% 99%  Weight:      Height:        Intake/Output Summary (Last 24 hours) at 05/01/2024 1012 Last data filed at 05/01/2024 0907 Gross per 24 hour  Intake 608.28 ml  Output 900 ml  Net -291.72 ml   [employer]  05/01/24 0108  Weight: 85.3 kg    Examination:  General exam: Overall comfortable, not in distress HEENT: PERRL, skin tear on the left temple Respiratory system:  no wheezes or crackles  Cardiovascular system: S1 & S2 heard, RRR.  Gastrointestinal system: Abdomen is nondistended, soft and nontender. Central nervous system: Alert and oriented Extremities: No edema, no clubbing ,no cyanosis Skin: No rashes, no ulcers,no icterus     Data Reviewed: I have personally reviewed following labs and imaging studies  CBC: Recent Labs  Lab 04/30/24 1722 05/01/24 0509  WBC 8.1 8.0  HGB 11.4* 10.9*  HCT 34.7* 32.0*  MCV 93.8 91.2  PLT 91* 85*    Basic Metabolic Panel: Recent Labs  Lab 04/30/24 1722 04/30/24 2354 05/01/24 0509  NA 137  --  138  K 4.0  --  4.0  CL 104  --  105  CO2 22  --  22  GLUCOSE 140*  --  129*  BUN 26*  --  21  CREATININE 1.78*  --  1.23  CALCIUM  8.6*  --  8.2*  MG  --  1.7 1.7  PHOS  --  2.9 3.1     Recent Results (from the past 240 hours)  Resp panel by RT-PCR (RSV, Flu A&B, Covid) Anterior Nasal Swab     Status: None   Collection Time: 04/30/24 11:54 PM   Specimen: Anterior Nasal Swab  Result Value Ref Range Status   SARS Coronavirus 2 by RT PCR NEGATIVE NEGATIVE Final    Comment: (NOTE) SARS-CoV-2 target nucleic acids are NOT DETECTED.  The SARS-CoV-2 RNA is generally detectable in upper respiratory specimens during the acute phase of infection. The lowest concentration of SARS-CoV-2 viral copies this assay can detect is 138 copies/mL. A negative result does not preclude SARS-Cov-2 infection and should not be used as the sole basis for treatment or other patient management decisions. A negative result may occur with  improper specimen collection/handling, submission of specimen other than nasopharyngeal swab, presence of viral mutation(s) within the areas targeted by this assay, and inadequate number of viral copies(<138 copies/mL). A negative result must be combined with clinical observations, patient history, and epidemiological information. The expected result is Negative.  Fact Sheet for Patients:  BloggerCourse.com  Fact Sheet for Healthcare Providers:  SeriousBroker.it  This test is no t yet approved or cleared by the [place]  FDA and  has been authorized for detection and/or diagnosis of SARS-CoV-2 by FDA under an Emergency Use Authorization (EUA). This EUA will remain  in effect (meaning this test can be used) for the duration of the COVID-19 declaration under Section 564(b)(1) of the Act, 21 U.S.C.section  360bbb-3(b)(1), unless the authorization is terminated  or revoked sooner.       Influenza A by PCR NEGATIVE NEGATIVE Final   Influenza B by PCR NEGATIVE NEGATIVE Final    Comment: (NOTE) The Xpert Xpress SARS-CoV-2/FLU/RSV plus assay is intended as an aid in the diagnosis of influenza from Nasopharyngeal swab specimens and should not be used as a sole basis for treatment. Nasal washings and aspirates are unacceptable for Xpert Xpress SARS-CoV-2/FLU/RSV testing.  Fact Sheet for Patients: BloggerCourse.com  Fact Sheet for Healthcare Providers: SeriousBroker.it  This test is not yet approved or cleared by the [place]  FDA and has been authorized for detection and/or diagnosis of SARS-CoV-2 by FDA under an Emergency Use Authorization (EUA). This EUA will remain in effect (meaning this test can be used) for the duration of the COVID-19  declaration under Section 564(b)(1) of the Act, 21 U.S.C. section 360bbb-3(b)(1), unless the authorization is terminated or revoked.     Resp Syncytial Virus by PCR NEGATIVE NEGATIVE Final    Comment: (NOTE) Fact Sheet for Patients: BloggerCourse.com  Fact Sheet for Healthcare Providers: SeriousBroker.it  This test is not yet approved or cleared by the [place]  FDA and has been authorized for detection and/or diagnosis of SARS-CoV-2 by FDA under an Emergency Use Authorization (EUA). This EUA will remain in effect (meaning this test can be used) for the duration of the COVID-19 declaration under Section 564(b)(1) of the Act, 21 U.S.C. section 360bbb-3(b)(1), unless the authorization is terminated or revoked.  Performed at [facility] [address], [place], [place] [address]      Radiology Studies: CT Head Wo Contrast Result Date: 04/30/2024 EXAM: CT HEAD AND CERVICAL SPINE 04/30/2024 10:11:43 PM TECHNIQUE: CT  of the head and cervical spine was performed without the administration of intravenous contrast. Multiplanar reformatted images are provided for review. Automated exposure control, iterative reconstruction, and/or weight based adjustment of the mA/kV was utilized to reduce the radiation dose to as low as reasonably achievable. COMPARISON: 10/23/2018 CLINICAL HISTORY: Head trauma, minor (Age >= 65y). Patient presents post 2 falls at home. He does not know why he fell. He possibly had a syncopal episode. He is concerned he may have a thyroid  disorder and states his neck on the left side is hurting right. A laceration is located on the left side of his head near his eyebrow. FINDINGS: CT HEAD BRAIN AND VENTRICLES: No acute intracranial hemorrhage. No mass effect or midline shift. No abnormal extra-axial fluid collection. Gray-white differentiation is maintained. No hydrocephalus. Chronic ischemic white matter changes. ORBITS: No acute abnormality. SINUSES AND MASTOIDS: No acute abnormality. SOFT TISSUES AND SKULL: No acute skull fracture. No acute soft tissue abnormality. CT CERVICAL SPINE BONES AND ALIGNMENT: No acute fracture or traumatic malalignment. Reversal of normal cervical lordosis. DEGENERATIVE CHANGES: No significant degenerative changes. SOFT TISSUES: No prevertebral soft tissue swelling. IMPRESSION: 1. No acute intracranial abnormality. 2. No acute fracture or traumatic malalignment of the cervical spine. Electronically signed by: Franky Stanford MD 04/30/2024 10:40 PM EDT RP Workstation: [id number]   CT Cervical Spine Wo Contrast Result Date: 04/30/2024 EXAM: CT HEAD AND CERVICAL SPINE 04/30/2024 10:11:43 PM TECHNIQUE: CT of the head and cervical spine was performed without the administration of intravenous contrast. Multiplanar reformatted images are provided for review. Automated exposure control, iterative reconstruction, and/or weight based adjustment of the mA/kV was utilized to reduce the radiation  dose to as low as reasonably achievable. COMPARISON: 10/23/2018 CLINICAL HISTORY: Head trauma, minor (Age >= 65y). Patient presents post 2 falls at home. He does not know why he fell. He possibly had a syncopal episode. He is concerned he may have a thyroid  disorder and states his neck on the left side is hurting right. A laceration is located on the left side of his head near his eyebrow. FINDINGS: CT HEAD BRAIN AND VENTRICLES: No acute intracranial hemorrhage. No mass effect or midline shift. No abnormal extra-axial fluid collection. Gray-white differentiation is maintained. No hydrocephalus. Chronic ischemic white matter changes. ORBITS: No acute abnormality. SINUSES AND MASTOIDS: No acute abnormality. SOFT TISSUES AND SKULL: No acute skull fracture. No acute soft tissue abnormality. CT CERVICAL SPINE BONES AND ALIGNMENT: No acute fracture or traumatic malalignment. Reversal of normal cervical lordosis. DEGENERATIVE CHANGES: No significant degenerative changes. SOFT TISSUES: No prevertebral soft tissue swelling. IMPRESSION: 1. No  acute intracranial abnormality. 2. No acute fracture or traumatic malalignment of the cervical spine. Electronically signed by: Franky Stanford MD 04/30/2024 10:40 PM EDT RP Workstation: [id number]   DG Chest 1 View Result Date: 04/30/2024 CLINICAL DATA:  Pain after fall. EXAM: CHEST  1 VIEW COMPARISON:  Radiograph 11/23/2022 FINDINGS: The cardiomediastinal contours are normal. The lungs are clear. Pulmonary vasculature is normal. No consolidation, pleural effusion, or pneumothorax. No acute osseous abnormalities are seen. IMPRESSION: No active disease. Electronically Signed   By: Andrea Gasman M.D.   On: 04/30/2024 20:57   DG Knee Complete 4 Views Right Result Date: 04/30/2024 CLINICAL DATA:  Pain after fall. EXAM: RIGHT KNEE - COMPLETE 4+ VIEW COMPARISON:  None Available. FINDINGS: No evidence of fracture, dislocation, or joint effusion. Tibial intramedullary nail with proximal  locking screws intact were visualized. Healed proximal fibular fracture. Mild soft tissue edema. IMPRESSION: 1. No acute fracture or dislocation of the right knee. 2. Mild soft tissue edema. Electronically Signed   By: Andrea Gasman M.D.   On: 04/30/2024 20:56   DG Shoulder Left Result Date: 04/30/2024 CLINICAL DATA:  Pain after fall. EXAM: LEFT SHOULDER - 2+ VIEW COMPARISON:  None Available. FINDINGS: There is no evidence of fracture or dislocation. Surgical anchors in the humeral head. Widening of the acromioclavicular joint is likely postsurgical. Soft tissues are unremarkable. IMPRESSION: No acute fracture or dislocation of the left shoulder. Electronically Signed   By: Andrea Gasman M.D.   On: 04/30/2024 20:55    Scheduled Meds:  aspirin  EC  81 mg Oral Daily   atorvastatin   80 mg Oral QPM   feeding supplement  237 mL Oral BID BM   gabapentin   400 mg Oral BID   insulin  aspart  0-9 Units Subcutaneous Q4H   insulin  glargine-yfgn  15 Units Subcutaneous Q2200   pantoprazole   40 mg Oral Daily   Continuous Infusions:  sodium chloride  125 mL/hr at 05/01/24 0901   cefTRIAXone  (ROCEPHIN )  IV 200 mL/hr at 05/01/24 0513     LOS: 0 days   Ivonne Mustache, MD [facility] P8/01/2024, 10:12 AM

## 2024-05-01 NOTE — Assessment & Plan Note (Signed)
 Order sliding scale decrease Lantus  to 15 units

## 2024-05-01 NOTE — Assessment & Plan Note (Signed)
 Check orthostatics prior to discharge

## 2024-05-01 NOTE — Plan of Care (Signed)

## 2024-05-01 NOTE — Assessment & Plan Note (Signed)
 Unclear etiology would benefit from echogram Of note patient had abnormal echo with suspicious left atrial mass but he had a repeat TEE done in 2020 that showed that he has a prominent ridge between the left atrial appendage and the left upper pulmonary veins versus was corresponding to the suspected mass found previously on the echo and does not explain his prior syncopes.  Patient has been having decreased p.o. and clinically appears to be dehydrated current syncope could be secondary to micturition syncope and/or cough syncope in the setting of decreased p.o. intake But given advanced age close observation would be warranted would benefit from cardiology follow-up.

## 2024-05-01 NOTE — Assessment & Plan Note (Signed)
 Turned out to be over read on the echogram

## 2024-05-01 NOTE — Assessment & Plan Note (Signed)
 Chronic and stable.

## 2024-05-01 NOTE — Assessment & Plan Note (Signed)
 Would benefit from PT OT assessment prior to discharge

## 2024-05-01 NOTE — Assessment & Plan Note (Signed)
 Initial imaging showed no evidence of pneumonia respiratory panel still pending no wheezing hold off on steroids for now supportive management he did receive a dose of Rocephin 

## 2024-05-01 NOTE — Evaluation (Signed)
 Clinical/Bedside Swallow Evaluation Patient Details  Name: Jarold Macomber MRN: [id number] Date of Birth: Apr 30, 1951  Today's Date: 05/01/2024 Time: SLP Start Time (ACUTE ONLY): 1515 SLP Stop Time (ACUTE ONLY): 1530 SLP Time Calculation (min) (ACUTE ONLY): 15 min  Past Medical History:  Past Medical History:  Diagnosis Date   Asthma    Diabetes mellitus    High cholesterol    Past Surgical History:  Past Surgical History:  Procedure Laterality Date   FRACTURE SURGERY Right    R leg   ROTATOR CUFF REPAIR Bilateral    TEE WITHOUT CARDIOVERSION N/A 10/26/2018   Procedure: TRANSESOPHAGEAL ECHOCARDIOGRAM (TEE);  Surgeon: Francyne Headland, MD;  Location: [facility] ENDOSCOPY;  Service: Cardiovascular;  Laterality: N/A;   HPI:  Pt is a 73 yr old male who presented 04/30/24 due to a fall and LOC. Xray and CT negative. PMH: thrombocytopenia, DM2, compression fractures, GERD, HTN HLD, compression fx, roommate sick, weight loss intentionally at first and then still lost weight, willing to do Ensure and high protein foods, falls x2 at home, neck pain - CT brain, neck negative, CXR negative. Swallow eval ordered by MD.  He is on a PPI at home.    Assessment / Plan / Recommendation  Clinical Impression  Patient presents with functional oropharyngeal swallow - no s/s of aspiration nor odynophagia across all po trials.  Pt easily passed 3 ounce Yale swallow challenge. He consumed approx 4 ounces jello, 8 ounces liquids and 3 graham crackers with timely swallow, adequate oral clearance and clear voice t/o. Pt does admit to h/o GERD for which he reports recently taking a PPI.    He admits to discomfort of his left neck with SLP palpating it but denies discomfort with swallow.  Provided him with denture adhesive, denture cup and tablet and reviewed importance of oral care.  No SLP follow up needed, thanks for this consult. SLP Visit Diagnosis: Dysphagia, oropharyngeal phase (R13.12)    Aspiration Risk  Mild  aspiration risk    Diet Recommendation Regular;Thin liquid    Liquid Administration via: Straw;Cup Medication Administration: Whole meds with puree Supervision: Patient able to self feed Compensations: Slow rate;Small sips/bites;Other (Comment) (start intake with liquids) Postural Changes: Seated upright at 90 degrees;Remain upright for at least 30 minutes after po intake    Other  Recommendations Oral Care Recommendations: Oral care BID     Assistance Recommended at Discharge    Functional Status Assessment Patient has had a recent decline in their functional status and demonstrates the ability to make significant improvements in function in a reasonable and predictable amount of time.  Frequency and Duration            Prognosis Prognosis for improved oropharyngeal function: Good      Swallow Study   General Date of Onset: 05/01/24 HPI: Pt is a 73 yr old male who presented 04/30/24 due to a fall and LOC. Xray and CT negative. PMH: thrombocytopenia, DM2, compression fractures, GERD, HTN HLD, compression fx, roommate sick, weight loss intentionally at first and then still lost weight, willing to do Ensure and high protein foods, falls x2 at home, neck pain - CT brain, neck negative, CXR negative. Swallow eval ordered by MD.  He is on a PPI at home. Type of Study: Bedside Swallow Evaluation Diet Prior to this Study: Regular;Thin liquids (Level 0) Temperature Spikes Noted: No Respiratory Status: Room air History of Recent Intubation: No Behavior/Cognition: Alert;Cooperative;Pleasant mood Oral Cavity Assessment: Within Functional Limits Oral Care Completed by  SLP: No Oral Cavity - Dentition: Dentures, top;Adequate natural dentition Vision: Functional for self-feeding Self-Feeding Abilities: Able to feed self Patient Positioning: Upright in bed Baseline Vocal Quality: Normal Volitional Cough: Strong Volitional Swallow: Able to elicit    Oral/Motor/Sensory Function Overall Oral  Motor/Sensory Function: Within functional limits   Ice Chips Ice chips: Within functional limits   Thin Liquid Thin Liquid: Within functional limits Presentation: Cup;Straw;Self Fed    Nectar Thick Nectar Thick Liquid: Not tested   Honey Thick Honey Thick Liquid: Not tested   Puree Puree: Within functional limits Presentation: Self Fed;Spoon   Solid     Solid: Within functional limits Presentation: Self Georgiana Acron, Emmie Caldron 05/01/2024,4:10 PM   Madelin POUR, MS [facility] SLP Acute Rehab Services Office [phone]

## 2024-05-01 NOTE — Assessment & Plan Note (Signed)
 Chronic stable continue Lipitor will need to confirm home dose

## 2024-05-01 NOTE — Assessment & Plan Note (Signed)
 Allow permissive hypertension given syncope

## 2024-05-01 NOTE — Progress Notes (Signed)
 Initial Nutrition Assessment  DOCUMENTATION CODES:   Not applicable  INTERVENTION:  - Carb Modified diet.  - Ensure Plus High Protein po BID, each supplement provides 350 kcal and 20 grams of protein. - Ordered outpatient dietitian referral.   NUTRITION DIAGNOSIS:   Increased nutrient needs related to acute illness as evidenced by estimated needs.  GOAL:   Patient will meet greater than or equal to 90% of their needs  MONITOR:   PO intake, Supplement acceptance, Weight trends  REASON FOR ASSESSMENT:   Consult Assessment of nutrition requirement/status  ASSESSMENT:   73 y.o. male with PMH significant of thrombocytopenia, DM2, GERD, HTN HLD who presented with syncope.  Patient reports a UBW of 235# and gradual weight loss for a long time. Notes he was initially told he needed to lose some weight and was trying to intentionally lose weight. However, he reports the weight loss continued despite him no longer trying to lose weight.  There is no weight history within the past year to assess trends. Patient noted to have been weighed at 210# in June 2024. He is now weighed at 188#.   Patient shares that he typically consumes 2 meals a day at home. Has eaten smaller portions/less overall for several months. However, has eaten substantially less over the past month as his significant other was in the hospital so he was only eating 1 meal a day.   Current appetite is good and patient ate 100% of his breakfast this morning. He has some concerns about his blood sugar and eating well to now maintain his weight and muscle mass. Encouraged intake of 3 meals a day as tolerated, focusing on protein rich food sources.  He tried an Ensure today and enjoyed it. Agreeable to continue for now to support intake.  He would like to have support to reach his nutrition goals after discharge. Added outpatient dietitian referral.    Medications reviewed and include: -  Labs reviewed:  - HA1C 7.9  (as of January 2020) - needs checked Blood Glucose 105-140 x24 hours  NUTRITION - FOCUSED PHYSICAL EXAM:  Flowsheet Row Most Recent Value  Orbital Region Mild depletion  Upper Arm Region No depletion  Thoracic and Lumbar Region No depletion  Buccal Region No depletion  Temple Region Mild depletion  Clavicle Bone Region No depletion  Clavicle and Acromion Bone Region Mild depletion  Scapular Bone Region Unable to assess  Dorsal Hand No depletion  Patellar Region Mild depletion  Anterior Thigh Region Mild depletion  Posterior Calf Region No depletion  Edema (RD Assessment) None  Hair Reviewed  Eyes Reviewed  Mouth Reviewed  Skin Reviewed  Nails Reviewed    Diet Order:   Diet Order             Diet Carb Modified Fluid consistency: Thin; Room service appropriate? Yes  Diet effective now                   EDUCATION NEEDS:  Education needs have been addressed  Skin:  Skin Assessment: Reviewed RN Assessment  Last BM:  8/5 - type 2  Height:  Ht Readings from Last 1 Encounters:  05/01/24 6' 2 (1.88 m)   Weight:  Wt Readings from Last 1 Encounters:  05/01/24 85.3 kg   Ideal Body Weight:  86.36 kg  BMI:  Body mass index is 24.15 kg/m.  Estimated Nutritional Needs:  Kcal:  2000-2200 kcals Protein:  85-100 grams Fluid:  >/= 2L    Amisha Pospisil  Debby RD, LDN Contact via Secure Chat.

## 2024-05-02 DIAGNOSIS — R55 Syncope and collapse: Secondary | ICD-10-CM | POA: Diagnosis not present

## 2024-05-02 LAB — GLUCOSE, CAPILLARY
Glucose-Capillary: 103 mg/dL — ABNORMAL HIGH (ref 70–99)
Glucose-Capillary: 105 mg/dL — ABNORMAL HIGH (ref 70–99)
Glucose-Capillary: 118 mg/dL — ABNORMAL HIGH (ref 70–99)
Glucose-Capillary: 135 mg/dL — ABNORMAL HIGH (ref 70–99)
Glucose-Capillary: 153 mg/dL — ABNORMAL HIGH (ref 70–99)
Glucose-Capillary: 193 mg/dL — ABNORMAL HIGH (ref 70–99)
Glucose-Capillary: 193 mg/dL — ABNORMAL HIGH (ref 70–99)

## 2024-05-02 LAB — HEMOGLOBIN A1C
Hgb A1c MFr Bld: 7.5 % — ABNORMAL HIGH (ref 4.8–5.6)
Mean Plasma Glucose: 169 mg/dL

## 2024-05-02 MED ORDER — SODIUM CHLORIDE 0.9 % IV SOLN: INTRAVENOUS | Status: DC

## 2024-05-02 NOTE — Progress Notes (Addendum)
 PHYSICAL THERAPY  Orthostatic vitals  Supine         BP108/70  HR 86 EOB             BP 113/67  HR 86 Standing      BP 95/60  HR 91 3 min walk   BP 90/60  HR 102   amb 25 feet 6 min walk   BP 88/65  HR 107   amb 65 feet   mild fuzzy Overall, I feel better stated Pt.  Same thing happened to me last year.  They never did know why. Improved but still light  Katheryn Leap  PTA Acute  Rehabilitation Services Office M-F          [phone]

## 2024-05-02 NOTE — Progress Notes (Signed)
 PROGRESS NOTE  Finch Costanzo  FMW:[id number] DOB: [birth year] DOA: 04/30/2024 PCP: System, Provider Not In   Brief Narrative: Patient is a 73 male with history of thrombocytopenia, diabetes type 2, compression fracture, GERD, hypertension, hyperlipidemia who presented with syncopal episodes twice at home.  He fell on the bathroom floor.  Also reported having cough and sore throat for the past week, decreased oral intake and was feeling tired and weak.  His roommate was also sick.  Patient admitted for further syncopal workup.Found to be orthostatic, started on IV fluids  Assessment & Plan:  Principal Problem:   Syncope Active Problems:   HLD (hyperlipidemia)   DM2 (diabetes mellitus, type 2) (HCC)   Essential hypertension   Thrombocytopenia (HCC)   Debility   Orthostasis   Left atrial mass   Bronchitis   Syncopal episodes:-Twice at home, fell on the bathroom.  Lives with a roommate.  Was feeling weak and was having poor oral intake since last few days.  Found to be orthostatic while being evaluated with PT.  Started on IV fluid.  Echo showed EF of 65%, no regional wall motion abnormality, no valvular normalities except for moderate calcification of aortic valve but no aortic stenosis.  Last echo in 2022 was suspicious for left atrial mass, current echo does not show it. Currently remains hemodynamically stable. X-ray of the right knee, left shoulder and cervical CT/head CT did not show any acute findings. PT again saw him today, orthostatic achieved.  Numbers better than yesterday but he is still orthostatic so we will continue IV fluid for today.  Suspected bronchitis: Was having cough and sore throat since last few days.  Respiratory viral panel, influenza, COVID-negative.  X-ray does not show pneumonia.  Holding steroid.  Lungs are clear to auscultation but he has dry cough.  Currently on ceftriaxone .  Continue cough medication.  Remains on room air  Thrombocytopenia: Chronic and  stable  Diabetes type 2: Continue current insulin  regimen.  Monitor blood sugars  Hypertension: Home medications on hold.  Hyperlipidemia: On Lipitor   Nutrition Problem: Increased nutrient needs Etiology: acute illness    DVT prophylaxis:SCDs Start: 05/01/24 0105     Code Status: Full Code  Family Communication: None at the bedside  Patient status:Obs  Patient is from :Home  Anticipated discharge un:Ynfz  Estimated DC date: Tomorrow   Consultants: None   Procedures:None  Antimicrobials:  Anti-infectives (From admission, onward)    Start     Dose/Rate Route Frequency Ordered Stop   04/30/24 2345  cefTRIAXone  (ROCEPHIN ) 1 g in sodium chloride  0.9 % 100 mL IVPB        1 g 200 mL/hr over 30 Minutes Intravenous Every 24 hours 04/30/24 2339         Subjective: Patient seen and examined at bedside today.  Hemodynamically stable.  Comfortable.  On room air.  Still has some dry cough.  Complains of some neck pain but feels better than yesterday.  Mildly orthostatic  this morning.  We decided to continue IV fluid for today  Objective: Vitals:   05/01/24 0836 05/01/24 1252 05/01/24 2003 05/02/24 0421  BP: 115/74 125/67 (!) 144/82 120/67  Pulse: 98 85 84 76  Resp:  20 20 20   Temp:  99.5 F (37.5 C) 99.1 F (37.3 C) 98.9 F (37.2 C)  TempSrc:  Oral Oral Oral  SpO2: 99% 98% 100% 98%  Weight:      Height:        Intake/Output Summary (Last 24  hours) at 05/02/2024 1116 Last data filed at 05/02/2024 1031 Gross per 24 hour  Intake 1594.06 ml  Output 2815 ml  Net -1220.94 ml   Filed Weights   05/01/24 0108  Weight: 85.3 kg    Examination:  General exam: Overall comfortable, not in distress HEENT: PERRL Respiratory system:  no wheezes or crackles  Cardiovascular system: S1 & S2 heard, RRR.  Gastrointestinal system: Abdomen is nondistended, soft and nontender. Central nervous system: Alert and oriented Extremities: No edema, no clubbing ,no cyanosis Skin:  No rashes, no ulcers,no icterus     Data Reviewed: I have personally reviewed following labs and imaging studies  CBC: Recent Labs  Lab 04/30/24 1722 05/01/24 0509  WBC 8.1 8.0  HGB 11.4* 10.9*  HCT 34.7* 32.0*  MCV 93.8 91.2  PLT 91* 85*   Basic Metabolic Panel: Recent Labs  Lab 04/30/24 1722 04/30/24 2354 05/01/24 0509  NA 137  --  138  K 4.0  --  4.0  CL 104  --  105  CO2 22  --  22  GLUCOSE 140*  --  129*  BUN 26*  --  21  CREATININE 1.78*  --  1.23  CALCIUM  8.6*  --  8.2*  MG  --  1.7 1.7  PHOS  --  2.9 3.1     Recent Results (from the past 240 hours)  Resp panel by RT-PCR (RSV, Flu A&B, Covid) Anterior Nasal Swab     Status: None   Collection Time: 04/30/24 11:54 PM   Specimen: Anterior Nasal Swab  Result Value Ref Range Status   SARS Coronavirus 2 by RT PCR NEGATIVE NEGATIVE Final    Comment: (NOTE) SARS-CoV-2 target nucleic acids are NOT DETECTED.  The SARS-CoV-2 RNA is generally detectable in upper respiratory specimens during the acute phase of infection. The lowest concentration of SARS-CoV-2 viral copies this assay can detect is 138 copies/mL. A negative result does not preclude SARS-Cov-2 infection and should not be used as the sole basis for treatment or other patient management decisions. A negative result may occur with  improper specimen collection/handling, submission of specimen other than nasopharyngeal swab, presence of viral mutation(s) within the areas targeted by this assay, and inadequate number of viral copies(<138 copies/mL). A negative result must be combined with clinical observations, patient history, and epidemiological information. The expected result is Negative.  Fact Sheet for Patients:  BloggerCourse.com  Fact Sheet for Healthcare Providers:  SeriousBroker.it  This test is no t yet approved or cleared by the [place]  FDA and  has been authorized for detection  and/or diagnosis of SARS-CoV-2 by FDA under an Emergency Use Authorization (EUA). This EUA will remain  in effect (meaning this test can be used) for the duration of the COVID-19 declaration under Section 564(b)(1) of the Act, 21 U.S.C.section 360bbb-3(b)(1), unless the authorization is terminated  or revoked sooner.       Influenza A by PCR NEGATIVE NEGATIVE Final   Influenza B by PCR NEGATIVE NEGATIVE Final    Comment: (NOTE) The Xpert Xpress SARS-CoV-2/FLU/RSV plus assay is intended as an aid in the diagnosis of influenza from Nasopharyngeal swab specimens and should not be used as a sole basis for treatment. Nasal washings and aspirates are unacceptable for Xpert Xpress SARS-CoV-2/FLU/RSV testing.  Fact Sheet for Patients: BloggerCourse.com  Fact Sheet for Healthcare Providers: SeriousBroker.it  This test is not yet approved or cleared by the [place]  FDA and has been authorized for detection and/or diagnosis of SARS-CoV-2  by FDA under an Emergency Use Authorization (EUA). This EUA will remain in effect (meaning this test can be used) for the duration of the COVID-19 declaration under Section 564(b)(1) of the Act, 21 U.S.C. section 360bbb-3(b)(1), unless the authorization is terminated or revoked.     Resp Syncytial Virus by PCR NEGATIVE NEGATIVE Final    Comment: (NOTE) Fact Sheet for Patients: BloggerCourse.com  Fact Sheet for Healthcare Providers: SeriousBroker.it  This test is not yet approved or cleared by the [place]  FDA and has been authorized for detection and/or diagnosis of SARS-CoV-2 by FDA under an Emergency Use Authorization (EUA). This EUA will remain in effect (meaning this test can be used) for the duration of the COVID-19 declaration under Section 564(b)(1) of the Act, 21 U.S.C. section 360bbb-3(b)(1), unless the authorization is terminated  or revoked.  Performed at [facility], [address] [place], [place] [address]      Radiology Studies: ECHOCARDIOGRAM COMPLETE Result Date: 05/01/2024    ECHOCARDIOGRAM REPORT   Patient Name:   Justin Clayton Date of Exam: 05/01/2024 Medical Rec #:  [id number]          Height:       74.0 in Accession #:    [id number]         Weight:       188.1 lb Date of Birth:  1951/05/14          BSA:          2.118 m Patient Age:    73 years           BP:           101/61 mmHg Patient Gender: M                  HR:           90 bpm. Exam Location:  Inpatient Procedure: 2D Echo, Cardiac Doppler and Color Doppler (Both Spectral and Color            Flow Doppler were utilized during procedure). Indications:    Syncope  History:        Patient has prior history of Echocardiogram examinations.                 Signs/Symptoms:Syncope; Risk Factors:Hypertension.  Sonographer:    Vella Key Referring Phys: [id number] ANASTASSIA DOUTOVA IMPRESSIONS  1. Left ventricular ejection fraction, by estimation, is 60 to 65%. The left ventricle has normal function. The left ventricle has no regional wall motion abnormalities. There is mild left ventricular hypertrophy. Left ventricular diastolic parameters are consistent with Grade I diastolic dysfunction (impaired relaxation).  2. Right ventricular systolic function is normal. The right ventricular size is normal.  3. The mitral valve is normal in structure. No evidence of mitral valve regurgitation. No evidence of mitral stenosis.  4. The aortic valve is tricuspid. There is moderate calcification of the aortic valve. There is moderate thickening of the aortic valve. Aortic valve regurgitation is mild. Aortic valve sclerosis/calcification is present, without any evidence of aortic stenosis.  5. The inferior vena cava is normal in size with greater than 50% respiratory variability, suggesting right atrial pressure of 3 mmHg. FINDINGS  Left Ventricle: Left ventricular  ejection fraction, by estimation, is 60 to 65%. The left ventricle has normal function. The left ventricle has no regional wall motion abnormalities. Strain was performed and the global longitudinal strain is indeterminate. The left ventricular internal cavity size was normal in size. There is  mild left ventricular hypertrophy. Left ventricular diastolic parameters are consistent with Grade I diastolic dysfunction (impaired relaxation). Right Ventricle: The right ventricular size is normal. No increase in right ventricular wall thickness. Right ventricular systolic function is normal. Left Atrium: Left atrial size was normal in size. Right Atrium: Right atrial size was normal in size. Pericardium: There is no evidence of pericardial effusion. Mitral Valve: The mitral valve is normal in structure. No evidence of mitral valve regurgitation. No evidence of mitral valve stenosis. Tricuspid Valve: The tricuspid valve is normal in structure. Tricuspid valve regurgitation is trivial. No evidence of tricuspid stenosis. Aortic Valve: The aortic valve is tricuspid. There is moderate calcification of the aortic valve. There is moderate thickening of the aortic valve. Aortic valve regurgitation is mild. Aortic valve sclerosis/calcification is present, without any evidence of aortic stenosis. Pulmonic Valve: The pulmonic valve was normal in structure. Pulmonic valve regurgitation is not visualized. No evidence of pulmonic stenosis. Aorta: The aortic root is normal in size and structure. Venous: The inferior vena cava is normal in size with greater than 50% respiratory variability, suggesting right atrial pressure of 3 mmHg. IAS/Shunts: No atrial level shunt detected by color flow Doppler. Additional Comments: 3D was performed not requiring image post processing on an independent workstation and was indeterminate.  LEFT VENTRICLE PLAX 2D LVIDd:         3.50 cm     Diastology LVIDs:         2.50 cm     LV e' medial:    6.53 cm/s  LV PW:         1.20 cm     LV E/e' medial:  16.4 LV IVS:        1.30 cm     LV e' lateral:   9.57 cm/s LVOT diam:     1.70 cm     LV E/e' lateral: 11.2 LV SV:         57 LV SV Index:   27 LVOT Area:     2.27 cm  LV Volumes (MOD) LV vol d, MOD A2C: 72.5 ml LV vol d, MOD A4C: 77.4 ml LV vol s, MOD A2C: 31.0 ml LV vol s, MOD A4C: 34.1 ml LV SV MOD A2C:     41.5 ml LV SV MOD A4C:     77.4 ml LV SV MOD BP:      43.0 ml RIGHT VENTRICLE RV Basal diam:  3.50 cm RV S prime:     16.30 cm/s TAPSE (M-mode): 1.5 cm LEFT ATRIUM             Index        RIGHT ATRIUM           Index LA diam:        3.10 cm 1.46 cm/m   RA Area:     14.90 cm LA Vol (A2C):   57.4 ml 27.11 ml/m  RA Volume:   37.00 ml  17.47 ml/m LA Vol (A4C):   46.5 ml 21.96 ml/m LA Biplane Vol: 56.4 ml 26.63 ml/m  AORTIC VALVE LVOT Vmax:   134.00 cm/s LVOT Vmean:  104.000 cm/s LVOT VTI:    0.252 m  AORTA Ao Root diam: 3.50 cm Ao Asc diam:  3.30 cm MITRAL VALVE MV Area (PHT): 4.41 cm     SHUNTS MV Decel Time: 172 msec     Systemic VTI:  0.25 m MV E velocity: 107.00 cm/s  Systemic Diam: 1.70 cm MV A  velocity: 117.00 cm/s MV E/A ratio:  0.91 Maude Emmer MD Electronically signed by Maude Emmer MD Signature Date/Time: 05/01/2024/3:10:55 PM    Final    CT Head Wo Contrast Result Date: 04/30/2024 EXAM: CT HEAD AND CERVICAL SPINE 04/30/2024 10:11:43 PM TECHNIQUE: CT of the head and cervical spine was performed without the administration of intravenous contrast. Multiplanar reformatted images are provided for review. Automated exposure control, iterative reconstruction, and/or weight based adjustment of the mA/kV was utilized to reduce the radiation dose to as low as reasonably achievable. COMPARISON: 10/23/2018 CLINICAL HISTORY: Head trauma, minor (Age >= 65y). Patient presents post 2 falls at home. He does not know why he fell. He possibly had a syncopal episode. He is concerned he may have a thyroid  disorder and states his neck on the left side is hurting right. A  laceration is located on the left side of his head near his eyebrow. FINDINGS: CT HEAD BRAIN AND VENTRICLES: No acute intracranial hemorrhage. No mass effect or midline shift. No abnormal extra-axial fluid collection. Gray-white differentiation is maintained. No hydrocephalus. Chronic ischemic white matter changes. ORBITS: No acute abnormality. SINUSES AND MASTOIDS: No acute abnormality. SOFT TISSUES AND SKULL: No acute skull fracture. No acute soft tissue abnormality. CT CERVICAL SPINE BONES AND ALIGNMENT: No acute fracture or traumatic malalignment. Reversal of normal cervical lordosis. DEGENERATIVE CHANGES: No significant degenerative changes. SOFT TISSUES: No prevertebral soft tissue swelling. IMPRESSION: 1. No acute intracranial abnormality. 2. No acute fracture or traumatic malalignment of the cervical spine. Electronically signed by: Franky Stanford MD 04/30/2024 10:40 PM EDT RP Workstation: [id number]   CT Cervical Spine Wo Contrast Result Date: 04/30/2024 EXAM: CT HEAD AND CERVICAL SPINE 04/30/2024 10:11:43 PM TECHNIQUE: CT of the head and cervical spine was performed without the administration of intravenous contrast. Multiplanar reformatted images are provided for review. Automated exposure control, iterative reconstruction, and/or weight based adjustment of the mA/kV was utilized to reduce the radiation dose to as low as reasonably achievable. COMPARISON: 10/23/2018 CLINICAL HISTORY: Head trauma, minor (Age >= 65y). Patient presents post 2 falls at home. He does not know why he fell. He possibly had a syncopal episode. He is concerned he may have a thyroid  disorder and states his neck on the left side is hurting right. A laceration is located on the left side of his head near his eyebrow. FINDINGS: CT HEAD BRAIN AND VENTRICLES: No acute intracranial hemorrhage. No mass effect or midline shift. No abnormal extra-axial fluid collection. Gray-white differentiation is maintained. No hydrocephalus. Chronic  ischemic white matter changes. ORBITS: No acute abnormality. SINUSES AND MASTOIDS: No acute abnormality. SOFT TISSUES AND SKULL: No acute skull fracture. No acute soft tissue abnormality. CT CERVICAL SPINE BONES AND ALIGNMENT: No acute fracture or traumatic malalignment. Reversal of normal cervical lordosis. DEGENERATIVE CHANGES: No significant degenerative changes. SOFT TISSUES: No prevertebral soft tissue swelling. IMPRESSION: 1. No acute intracranial abnormality. 2. No acute fracture or traumatic malalignment of the cervical spine. Electronically signed by: Franky Stanford MD 04/30/2024 10:40 PM EDT RP Workstation: [id number]   DG Chest 1 View Result Date: 04/30/2024 CLINICAL DATA:  Pain after fall. EXAM: CHEST  1 VIEW COMPARISON:  Radiograph 11/23/2022 FINDINGS: The cardiomediastinal contours are normal. The lungs are clear. Pulmonary vasculature is normal. No consolidation, pleural effusion, or pneumothorax. No acute osseous abnormalities are seen. IMPRESSION: No active disease. Electronically Signed   By: Andrea Gasman M.D.   On: 04/30/2024 20:57   DG Knee Complete 4 Views Right Result Date: 04/30/2024 CLINICAL DATA:  Pain after fall. EXAM: RIGHT KNEE - COMPLETE 4+ VIEW COMPARISON:  None Available. FINDINGS: No evidence of fracture, dislocation, or joint effusion. Tibial intramedullary nail with proximal locking screws intact were visualized. Healed proximal fibular fracture. Mild soft tissue edema. IMPRESSION: 1. No acute fracture or dislocation of the right knee. 2. Mild soft tissue edema. Electronically Signed   By: Andrea Gasman M.D.   On: 04/30/2024 20:56   DG Shoulder Left Result Date: 04/30/2024 CLINICAL DATA:  Pain after fall. EXAM: LEFT SHOULDER - 2+ VIEW COMPARISON:  None Available. FINDINGS: There is no evidence of fracture or dislocation. Surgical anchors in the humeral head. Widening of the acromioclavicular joint is likely postsurgical. Soft tissues are unremarkable. IMPRESSION: No acute  fracture or dislocation of the left shoulder. Electronically Signed   By: Andrea Gasman M.D.   On: 04/30/2024 20:55    Scheduled Meds:  aspirin  EC  81 mg Oral Daily   atorvastatin   80 mg Oral QPM   chlorpheniramine-HYDROcodone   5 mL Oral Q12H   feeding supplement  237 mL Oral BID BM   gabapentin   400 mg Oral BID   insulin  aspart  0-9 Units Subcutaneous Q4H   insulin  glargine-yfgn  15 Units Subcutaneous Q2200   pantoprazole   40 mg Oral Daily   Continuous Infusions:  sodium chloride  100 mL/hr at 05/02/24 1034   cefTRIAXone  (ROCEPHIN )  IV 1 g (05/01/24 2322)     LOS: 1 day   Ivonne Mustache, MD [facility] P8/02/2024, 11:16 AM

## 2024-05-02 NOTE — Progress Notes (Signed)
 Physical Therapy Treatment Patient Details Name: Justin Clayton MRN: [id number] DOB: 21-Jul-1951 Today's Date: 05/02/2024   History of Present Illness Pt is a 73 yr old male who presented 04/30/24 due to a fall and LOC. Xray and CT negative. PMH: thrombocytopenia, DM2, compression fractures, GERD, HTN HLD    PT Comments  PT - Cognition Comments: AxO x 3 pleasant and motivated.  Stated this happened last year and don't know why.  Pt reports poor appitiete, I just don't feel like eating much.  Pt stated he does not get dizzy before his syncope episodes but he does start to fade in vision/hearing.  Pt lives in a 2 bedroom CONDO with a male roommate.  We share the bills/utilities and Dog duties. Prior to admit, Pt was IND/Driving.  Pt self able to get OOB and stand with no c/o's.  Assisted with amb in hallway while monitoring vitals: Supine         BP108/70  HR 86 EOB             BP 113/67  HR 86 Standing      BP 95/60  HR 91 3 min walk   BP 90/60  HR 102   amb 25 feet 6 min walk   BP 88/65  HR 107   amb 65 feet   mild fuzzy Overall, I feel better stated Pt.  Same thing happened to me last year.  They never did know why. Improved but still light   General Gait Details: used a RW just for safety (light use) will not need once home.  Monitoring BP's/vitals with increased distance.  Pt stated he feels better today.  Pt hoping to go home today. LPT has rec NO post acute PT needs or equipment.     If plan is discharge home, recommend the following: A little help with walking and/or transfers;Help with stairs or ramp for entrance   Can travel by private vehicle        Equipment Recommendations  None recommended by PT    Recommendations for Other Services       Precautions / Restrictions Precautions Precautions: Fall Precaution/Restrictions Comments: falls at home related to syncope/weakness Restrictions Weight Bearing Restrictions Per Provider Order: No      Mobility  Bed Mobility Overal bed mobility: Modified Independent             General bed mobility comments: self able    Transfers Overall transfer level: Modified independent                 General transfer comment: self able with good use of hands to steady self    Ambulation/Gait Ambulation/Gait assistance: Modified independent (Device/Increase time), Supervision Gait Distance (Feet): 85 Feet Assistive device: Rolling walker (2 wheels) Gait Pattern/deviations: Step-through pattern Gait velocity: WNL     General Gait Details: used a RW just for safety (light use) will not need once home.  Monitoring BP's/vitals with increased distance.  Pt stated he feels better today.  Pt hoping to go home today.   Stairs             Wheelchair Mobility     Tilt Bed    Modified Rankin (Stroke Patients Only)       Balance  Communication Communication Communication: No apparent difficulties  Cognition Arousal: Alert Behavior During Therapy: WFL for tasks assessed/performed   PT - Cognitive impairments: No apparent impairments                       PT - Cognition Comments: AxO x 3 pleasant and motivated.  Stated this happened last year and don't know why.  Pt reports poor appitiete, I just don't feel like eating much.  Pt stated he does not get dizzy before his syncope episodes but he does start to fade in vision/hearing.  Pt lives in a 2 bedroom CONDO with a male roommate.  We share the bills/utilities and Dog duties. Prior to admit, Pt was IND/Driving. Following commands: Intact      Cueing Cueing Techniques: Verbal cues  Exercises      General Comments        Pertinent Vitals/Pain Pain Assessment Pain Assessment: Faces Faces Pain Scale: Hurts a little bit Pain Location: neck from the fall Pain Descriptors / Indicators: Discomfort Pain Intervention(s):  Monitored during session, Heat applied    Home Living                          Prior Function            PT Goals (current goals can now be found in the care plan section) Progress towards PT goals: Progressing toward goals    Frequency    Min 2X/week      PT Plan      Co-evaluation              AM-PAC PT 6 Clicks Mobility   Outcome Measure  Help needed turning from your back to your side while in a flat bed without using bedrails?: None Help needed moving from lying on your back to sitting on the side of a flat bed without using bedrails?: None Help needed moving to and from a bed to a chair (including a wheelchair)?: None Help needed standing up from a chair using your arms (e.g., wheelchair or bedside chair)?: None Help needed to walk in hospital room?: A Little Help needed climbing 3-5 steps with a railing? : A Little 6 Click Score: 22    End of Session Equipment Utilized During Treatment: Gait belt Activity Tolerance: Patient tolerated treatment well Patient left: in chair;with call bell/phone within reach Nurse Communication: Mobility status PT Visit Diagnosis: Difficulty in walking, not elsewhere classified (R26.2);History of falling (Z91.81)     Time: 9052-8984 PT Time Calculation (min) (ACUTE ONLY): 28 min  Charges:    $Gait Training: 8-22 mins $Therapeutic Activity: 8-22 mins PT General Charges $$ ACUTE PT VISIT: 1 Visit                     Katheryn Leap  PTA Acute  Rehabilitation Services Office M-F          [phone]

## 2024-05-02 NOTE — TOC Initial Note (Signed)
 Transition of Care [facility]) - Initial/Assessment Note    Patient Details  Name: Justin Clayton MRN: [id number] Date of Birth: August 05, 1951  Transition [facility] CM/SW Contact:    Bascom Service, RN Phone Number: 05/02/2024, 1:24 PM  Clinical Narrative: d/c plan home.                  Expected Discharge Plan: Home/Self Care Barriers to Discharge: Continued Medical Work up   Patient Goals and CMS Choice Patient states their goals for this hospitalization and ongoing recovery are:: Home CMS Medicare.gov Compare Post Acute Care list provided to:: Patient Choice offered to / list presented to : Patient [place] ownership interest in [facility].provided to:: Patient    Expected Discharge Plan and Services                                              Prior Living Arrangements/Services                       Activities of Daily Living   ADL Screening (condition at time of admission) Independently performs ADLs?: Yes (appropriate for developmental age) Is the patient deaf or have difficulty hearing?: No Does the patient have difficulty seeing, even when wearing glasses/contacts?: No Does the patient have difficulty concentrating, remembering, or making decisions?: No  Permission Sought/Granted                  Emotional Assessment              Admission diagnosis:  Syncope and collapse [R55] Syncope [R55] Thrombocytopenia (HCC) [D69.6] Patient Active Problem List   Diagnosis Date Noted   Bronchitis 05/01/2024   Left atrial mass    Orthostasis 10/24/2018   Constipation    Syncope 10/23/2018   Thrombocytopenia (HCC) 10/23/2018   AKI (acute kidney injury) (HCC) 10/23/2018   Debility 10/23/2018   Chest pain 02/28/2017   HLD (hyperlipidemia) 02/28/2017   DM2 (diabetes mellitus, type 2) (HCC) 02/28/2017   Essential hypertension 02/28/2017   Asthma 02/28/2017   PCP:  System, Provider Not In Pharmacy:   [facility] [place], [place] - [address] [place] [address] Phone: [phone] Fax: [phone]  DARRYLE LONG - [facility] [address] [place] [address] Phone: [phone] Fax: [phone]     Social Drivers of Health (SDOH) Social History: SDOH Screenings   Food Insecurity: No Food Insecurity (05/01/2024)  Housing: Low Risk  (05/01/2024)  Transportation Needs: No Transportation Needs (05/01/2024)  Utilities: Not At Risk (05/01/2024)  Depression (PHQ2-9): Low Risk  (10/31/2018)  Social Connections: Socially Integrated (05/01/2024)  Tobacco Use: High Risk (04/30/2024)   SDOH Interventions:     Readmission Risk Interventions     No data to display

## 2024-05-02 NOTE — Plan of Care (Signed)
  Problem: Education: Goal: Ability to describe self-care measures that may prevent or decrease complications (Diabetes Survival Skills Education) will improve Outcome: Progressing Goal: Individualized Educational Video(s) Outcome: Progressing   Problem: Coping: Goal: Ability to adjust to condition or change in health will improve Outcome: Progressing   Problem: Fluid Volume: Goal: Ability to maintain a balanced intake and output will improve Outcome: Progressing   Problem: Health Behavior/Discharge Planning: Goal: Ability to identify and utilize available resources and services will improve Outcome: Progressing Goal: Ability to manage health-related needs will improve Outcome: Progressing   Problem: Nutritional: Goal: Maintenance of adequate nutrition will improve Outcome: Progressing

## 2024-05-03 ENCOUNTER — Other Ambulatory Visit (HOSPITAL_COMMUNITY): Payer: Self-pay

## 2024-05-03 DIAGNOSIS — R55 Syncope and collapse: Secondary | ICD-10-CM | POA: Diagnosis not present

## 2024-05-03 LAB — GLUCOSE, CAPILLARY
Glucose-Capillary: 122 mg/dL — ABNORMAL HIGH (ref 70–99)
Glucose-Capillary: 100 mg/dL — ABNORMAL HIGH (ref 70–99)

## 2024-05-03 MED ORDER — DOXYCYCLINE HYCLATE 100 MG PO TABS
100.0000 mg | ORAL_TABLET | Freq: Two times a day (BID) | ORAL | 0 refills | Status: AC
  Filled 2024-05-03: qty 6, 3d supply, fill #0

## 2024-05-03 MED ORDER — MIDODRINE HCL 5 MG PO TABS: 5.0000 mg | ORAL_TABLET | Freq: Three times a day (TID) | ORAL | Status: DC

## 2024-05-03 MED ORDER — GUAIFENESIN-DM 100-10 MG/5ML PO SYRP
5.0000 mL | ORAL_SOLUTION | ORAL | 0 refills | Status: AC | PRN
  Filled 2024-05-03: qty 118, 4d supply, fill #0

## 2024-05-03 MED ORDER — MIDODRINE HCL 5 MG PO TABS
5.0000 mg | ORAL_TABLET | Freq: Three times a day (TID) | ORAL | 0 refills | Status: AC
  Filled 2024-05-03: qty 90, 30d supply, fill #0

## 2024-05-03 NOTE — Discharge Summary (Signed)
 Physician Discharge Summary  Justin Clayton FMW:[id number] DOB: [birth year] DOA: 04/30/2024  PCP: System, Provider Not In  Admit date: 04/30/2024 Discharge date: 05/03/2024  Admitted From: Home Disposition:  Home  Discharge Condition:Stable CODE STATUS:FULL Diet recommendation:Carb Modified  Brief/Interim Summary: Patient is a 30 male with history of thrombocytopenia, diabetes type 2, compression fracture, GERD, hypertension, hyperlipidemia who presented with syncopal episodes twice at home.  He fell on the bathroom floor.  Also reported having cough and sore throat for the past week, decreased oral intake and was feeling tired and weak.  His roommate was also sick.  Patient admitted for further syncopal workup.Found to be orthostatic, started on IV fluids .  Patient is still mildly orthostatic today but he does not have any dizziness or lightheadedness on standing.  Medically stable for discharge home today..  Did not recommend any follow-up.  Has been started on midodrine .  Following problems were addressed during the hospitalization:  Syncopal episodes:-Fell twice at home, fell on the bathroom.  Lives with a roommate.  Was feeling weak and was having poor oral intake since last few days.  Found to be orthostatic while being evaluated with PT.  Started on IV fluid.  Echo showed EF of 65%, no regional wall motion abnormality, no valvular normalities except for moderate calcification of aortic valve but no aortic stenosis.  Last echo in 2022 was suspicious for left atrial mass, current echo does not show it. Currently remains hemodynamically stable. X-ray of the right knee, left shoulder and cervical CT/head CT did not show any acute findings. He is still mildly orthostatic but he is not symptomatic.  Started on low-dose milrinone.  Patient has been counseled to be very careful and not abruptly get up from lying or sitting position.  PT did not recommend any follow-up.   Suspected bronchitis: Was  having cough and sore throat since last few days.  Respiratory viral panel, influenza, COVID-negative.  X-ray does not show pneumonia. Lungs are clear to auscultation but he has dry cough.  Antibiotics changed to doxycycline .  Continue cough medication.  Remains on room air   Thrombocytopenia: Chronic and stable   Diabetes type 2: Continue current insulin  regimen.  Monitor blood sugars at home   Hypertension: Was taking lisinopril .  Will be stopped.   Hyperlipidemia: On Lipitor       Discharge Diagnoses:  Principal Problem:   Syncope Active Problems:   HLD (hyperlipidemia)   DM2 (diabetes mellitus, type 2) (HCC)   Essential hypertension   Thrombocytopenia (HCC)   Debility   Orthostasis   Left atrial mass   Bronchitis    Discharge Instructions  Discharge Instructions     Amb Referral to Nutrition and Diabetic Education   Complete by: As directed    Diet Carb Modified   Complete by: As directed    Discharge instructions   Complete by: As directed    1)Please take your medications as instructed 2)Please  follow-up with your PCP in a week 3)We have noticed that your blood pressure is on the lower side.  Please monitor your blood pressure at home.  Do not get up abruptly from sitting or lying patient.  Be very careful while ambulating   Increase activity slowly   Complete by: As directed       Allergies as of 05/03/2024   No Known Allergies      Medication List     STOP taking these medications    acyclovir  800 MG tablet Commonly known as:  ZOVIRAX    lisinopril  20 MG tablet Commonly known as: ZESTRIL    terazosin 5 MG capsule Commonly known as: HYTRIN       TAKE these medications    albuterol  108 (90 Base) MCG/ACT inhaler Commonly known as: VENTOLIN  HFA Inhale 2 puffs into the lungs every 4 (four) hours as needed for wheezing or shortness of breath. For shortness of breath   alendronate 70 MG tablet Commonly known as: FOSAMAX Take 70 mg by mouth once  a week.   aspirin  EC 81 MG tablet Take 81 mg by mouth daily.   atorvastatin  40 MG tablet Commonly known as: LIPITOR Take 40 mg by mouth every evening.   doxycycline  100 MG tablet Commonly known as: VIBRA -TABS Take 1 tablet (100 mg total) by mouth 2 (two) times daily for 3 days.   gabapentin  400 MG capsule Commonly known as: NEURONTIN  Take 400 mg by mouth 2 (two) times daily.   glipiZIDE 10 MG tablet Commonly known as: GLUCOTROL Take 10 mg by mouth 2 (two) times daily before a meal.   guaiFENesin -dextromethorphan  100-10 MG/5ML syrup Commonly known as: ROBITUSSIN DM Take 5 mLs by mouth every 4 (four) hours as needed for cough.   insulin  aspart 100 UNIT/ML injection Commonly known as: novoLOG  Inject 6 Units into the skin 2 (two) times daily with a meal.   insulin  glargine 100 unit/mL Sopn Commonly known as: LANTUS  Inject 24 Units into the skin daily.   midodrine  5 MG tablet Commonly known as: PROAMATINE  Take 1 tablet (5 mg total) by mouth 3 (three) times daily with meals.   nicotine 7 mg/24hr patch Commonly known as: NICODERM CQ - dosed in mg/24 hr Place 7 mg onto the skin daily as needed (nicotine addiction).   pantoprazole  40 MG tablet Commonly known as: PROTONIX  Take 40 mg by mouth daily.   traMADol 50 MG tablet Commonly known as: ULTRAM Take 50 mg by mouth 3 (three) times daily as needed for moderate pain (pain score 4-6) or severe pain (pain score 7-10).   Vitamin D-3 25 MCG (1000 UT) Caps Take 1 capsule by mouth daily.        No Known Allergies  Consultations: None   Procedures/Studies: ECHOCARDIOGRAM COMPLETE Result Date: 05/01/2024    ECHOCARDIOGRAM REPORT   Patient Name:   Justin Clayton Date of Exam: 05/01/2024 Medical Rec #:  [id number]          Height:       74.0 in Accession #:    [id number]         Weight:       188.1 lb Date of Birth:  [birth year]          BSA:          2.118 m Patient Age:    73 years           BP:           101/61 mmHg  Patient Gender: M                  HR:           90 bpm. Exam Location:  Inpatient Procedure: 2D Echo, Cardiac Doppler and Color Doppler (Both Spectral and Color            Flow Doppler were utilized during procedure). Indications:    Syncope  History:        Patient has prior history of Echocardiogram examinations.  Signs/Symptoms:Syncope; Risk Factors:Hypertension.  Sonographer:    Vella Key Referring Phys: [id number] ANASTASSIA DOUTOVA IMPRESSIONS  1. Left ventricular ejection fraction, by estimation, is 60 to 65%. The left ventricle has normal function. The left ventricle has no regional wall motion abnormalities. There is mild left ventricular hypertrophy. Left ventricular diastolic parameters are consistent with Grade I diastolic dysfunction (impaired relaxation).  2. Right ventricular systolic function is normal. The right ventricular size is normal.  3. The mitral valve is normal in structure. No evidence of mitral valve regurgitation. No evidence of mitral stenosis.  4. The aortic valve is tricuspid. There is moderate calcification of the aortic valve. There is moderate thickening of the aortic valve. Aortic valve regurgitation is mild. Aortic valve sclerosis/calcification is present, without any evidence of aortic stenosis.  5. The inferior vena cava is normal in size with greater than 50% respiratory variability, suggesting right atrial pressure of 3 mmHg. FINDINGS  Left Ventricle: Left ventricular ejection fraction, by estimation, is 60 to 65%. The left ventricle has normal function. The left ventricle has no regional wall motion abnormalities. Strain was performed and the global longitudinal strain is indeterminate. The left ventricular internal cavity size was normal in size. There is mild left ventricular hypertrophy. Left ventricular diastolic parameters are consistent with Grade I diastolic dysfunction (impaired relaxation). Right Ventricle: The right ventricular size is normal. No increase  in right ventricular wall thickness. Right ventricular systolic function is normal. Left Atrium: Left atrial size was normal in size. Right Atrium: Right atrial size was normal in size. Pericardium: There is no evidence of pericardial effusion. Mitral Valve: The mitral valve is normal in structure. No evidence of mitral valve regurgitation. No evidence of mitral valve stenosis. Tricuspid Valve: The tricuspid valve is normal in structure. Tricuspid valve regurgitation is trivial. No evidence of tricuspid stenosis. Aortic Valve: The aortic valve is tricuspid. There is moderate calcification of the aortic valve. There is moderate thickening of the aortic valve. Aortic valve regurgitation is mild. Aortic valve sclerosis/calcification is present, without any evidence of aortic stenosis. Pulmonic Valve: The pulmonic valve was normal in structure. Pulmonic valve regurgitation is not visualized. No evidence of pulmonic stenosis. Aorta: The aortic root is normal in size and structure. Venous: The inferior vena cava is normal in size with greater than 50% respiratory variability, suggesting right atrial pressure of 3 mmHg. IAS/Shunts: No atrial level shunt detected by color flow Doppler. Additional Comments: 3D was performed not requiring image post processing on an independent workstation and was indeterminate.  LEFT VENTRICLE PLAX 2D LVIDd:         3.50 cm     Diastology LVIDs:         2.50 cm     LV e' medial:    6.53 cm/s LV PW:         1.20 cm     LV E/e' medial:  16.4 LV IVS:        1.30 cm     LV e' lateral:   9.57 cm/s LVOT diam:     1.70 cm     LV E/e' lateral: 11.2 LV SV:         57 LV SV Index:   27 LVOT Area:     2.27 cm  LV Volumes (MOD) LV vol d, MOD A2C: 72.5 ml LV vol d, MOD A4C: 77.4 ml LV vol s, MOD A2C: 31.0 ml LV vol s, MOD A4C: 34.1 ml LV SV MOD A2C:  41.5 ml LV SV MOD A4C:     77.4 ml LV SV MOD BP:      43.0 ml RIGHT VENTRICLE RV Basal diam:  3.50 cm RV S prime:     16.30 cm/s TAPSE (M-mode): 1.5 cm  LEFT ATRIUM             Index        RIGHT ATRIUM           Index LA diam:        3.10 cm 1.46 cm/m   RA Area:     14.90 cm LA Vol (A2C):   57.4 ml 27.11 ml/m  RA Volume:   37.00 ml  17.47 ml/m LA Vol (A4C):   46.5 ml 21.96 ml/m LA Biplane Vol: 56.4 ml 26.63 ml/m  AORTIC VALVE LVOT Vmax:   134.00 cm/s LVOT Vmean:  104.000 cm/s LVOT VTI:    0.252 m  AORTA Ao Root diam: 3.50 cm Ao Asc diam:  3.30 cm MITRAL VALVE MV Area (PHT): 4.41 cm     SHUNTS MV Decel Time: 172 msec     Systemic VTI:  0.25 m MV E velocity: 107.00 cm/s  Systemic Diam: 1.70 cm MV A velocity: 117.00 cm/s MV E/A ratio:  0.91 Maude Emmer MD Electronically signed by Maude Emmer MD Signature Date/Time: 05/01/2024/3:10:55 PM    Final    CT Head Wo Contrast Result Date: 04/30/2024 EXAM: CT HEAD AND CERVICAL SPINE 04/30/2024 10:11:43 PM TECHNIQUE: CT of the head and cervical spine was performed without the administration of intravenous contrast. Multiplanar reformatted images are provided for review. Automated exposure control, iterative reconstruction, and/or weight based adjustment of the mA/kV was utilized to reduce the radiation dose to as low as reasonably achievable. COMPARISON: 10/23/2018 CLINICAL HISTORY: Head trauma, minor (Age >= 65y). Patient presents post 2 falls at home. He does not know why he fell. He possibly had a syncopal episode. He is concerned he may have a thyroid  disorder and states his neck on the left side is hurting right. A laceration is located on the left side of his head near his eyebrow. FINDINGS: CT HEAD BRAIN AND VENTRICLES: No acute intracranial hemorrhage. No mass effect or midline shift. No abnormal extra-axial fluid collection. Gray-white differentiation is maintained. No hydrocephalus. Chronic ischemic white matter changes. ORBITS: No acute abnormality. SINUSES AND MASTOIDS: No acute abnormality. SOFT TISSUES AND SKULL: No acute skull fracture. No acute soft tissue abnormality. CT CERVICAL SPINE BONES AND  ALIGNMENT: No acute fracture or traumatic malalignment. Reversal of normal cervical lordosis. DEGENERATIVE CHANGES: No significant degenerative changes. SOFT TISSUES: No prevertebral soft tissue swelling. IMPRESSION: 1. No acute intracranial abnormality. 2. No acute fracture or traumatic malalignment of the cervical spine. Electronically signed by: Franky Stanford MD 04/30/2024 10:40 PM EDT RP Workstation: [id number]   CT Cervical Spine Wo Contrast Result Date: 04/30/2024 EXAM: CT HEAD AND CERVICAL SPINE 04/30/2024 10:11:43 PM TECHNIQUE: CT of the head and cervical spine was performed without the administration of intravenous contrast. Multiplanar reformatted images are provided for review. Automated exposure control, iterative reconstruction, and/or weight based adjustment of the mA/kV was utilized to reduce the radiation dose to as low as reasonably achievable. COMPARISON: 10/23/2018 CLINICAL HISTORY: Head trauma, minor (Age >= 65y). Patient presents post 2 falls at home. He does not know why he fell. He possibly had a syncopal episode. He is concerned he may have a thyroid  disorder and states his neck on the left side is hurting right. A  laceration is located on the left side of his head near his eyebrow. FINDINGS: CT HEAD BRAIN AND VENTRICLES: No acute intracranial hemorrhage. No mass effect or midline shift. No abnormal extra-axial fluid collection. Gray-white differentiation is maintained. No hydrocephalus. Chronic ischemic white matter changes. ORBITS: No acute abnormality. SINUSES AND MASTOIDS: No acute abnormality. SOFT TISSUES AND SKULL: No acute skull fracture. No acute soft tissue abnormality. CT CERVICAL SPINE BONES AND ALIGNMENT: No acute fracture or traumatic malalignment. Reversal of normal cervical lordosis. DEGENERATIVE CHANGES: No significant degenerative changes. SOFT TISSUES: No prevertebral soft tissue swelling. IMPRESSION: 1. No acute intracranial abnormality. 2. No acute fracture or traumatic  malalignment of the cervical spine. Electronically signed by: Franky Stanford MD 04/30/2024 10:40 PM EDT RP Workstation: [id number]   DG Chest 1 View Result Date: 04/30/2024 CLINICAL DATA:  Pain after fall. EXAM: CHEST  1 VIEW COMPARISON:  Radiograph 11/23/2022 FINDINGS: The cardiomediastinal contours are normal. The lungs are clear. Pulmonary vasculature is normal. No consolidation, pleural effusion, or pneumothorax. No acute osseous abnormalities are seen. IMPRESSION: No active disease. Electronically Signed   By: Andrea Gasman M.D.   On: 04/30/2024 20:57   DG Knee Complete 4 Views Right Result Date: 04/30/2024 CLINICAL DATA:  Pain after fall. EXAM: RIGHT KNEE - COMPLETE 4+ VIEW COMPARISON:  None Available. FINDINGS: No evidence of fracture, dislocation, or joint effusion. Tibial intramedullary nail with proximal locking screws intact were visualized. Healed proximal fibular fracture. Mild soft tissue edema. IMPRESSION: 1. No acute fracture or dislocation of the right knee. 2. Mild soft tissue edema. Electronically Signed   By: Andrea Gasman M.D.   On: 04/30/2024 20:56   DG Shoulder Left Result Date: 04/30/2024 CLINICAL DATA:  Pain after fall. EXAM: LEFT SHOULDER - 2+ VIEW COMPARISON:  None Available. FINDINGS: There is no evidence of fracture or dislocation. Surgical anchors in the humeral head. Widening of the acromioclavicular joint is likely postsurgical. Soft tissues are unremarkable. IMPRESSION: No acute fracture or dislocation of the left shoulder. Electronically Signed   By: Andrea Gasman M.D.   On: 04/30/2024 20:55      Subjective: Patient seen and examined at bedside today.  Hemodynamically stable.  Comfortable.  No lightheadedness or dizziness while sitting or lying on the bed.  Mildly orthostatic on checking today but he is asymptomatic.  He is eager to go home.  Medically stable for discharge.  Discharge Exam: Vitals:   05/02/24 2005 05/03/24 0412  BP: (!) 149/92 110/72  Pulse:  87 81  Resp: 17 18  Temp: 98.3 F (36.8 C) 98.1 F (36.7 C)  SpO2: 98% 100%   Vitals:   05/02/24 0421 05/02/24 1244 05/02/24 2005 05/03/24 0412  BP: 120/67 133/80 (!) 149/92 110/72  Pulse: 76 86 87 81  Resp: 20 16 17 18   Temp: 98.9 F (37.2 C) 98.7 F (37.1 C) 98.3 F (36.8 C) 98.1 F (36.7 C)  TempSrc: Oral Oral Oral Oral  SpO2: 98% 100% 98% 100%  Weight:      Height:        General: Pt is alert, awake, not in acute distress Cardiovascular: RRR, S1/S2 +, no rubs, no gallops Respiratory: CTA bilaterally, no wheezing, no rhonchi Abdominal: Soft, NT, ND, bowel sounds + Extremities: no edema, no cyanosis    The results of significant diagnostics from this hospitalization (including imaging, microbiology, ancillary and laboratory) are listed below for reference.     Microbiology: Recent Results (from the past 240 hours)  Resp panel by RT-PCR (RSV,  Flu A&B, Covid) Anterior Nasal Swab     Status: None   Collection Time: 04/30/24 11:54 PM   Specimen: Anterior Nasal Swab  Result Value Ref Range Status   SARS Coronavirus 2 by RT PCR NEGATIVE NEGATIVE Final    Comment: (NOTE) SARS-CoV-2 target nucleic acids are NOT DETECTED.  The SARS-CoV-2 RNA is generally detectable in upper respiratory specimens during the acute phase of infection. The lowest concentration of SARS-CoV-2 viral copies this assay can detect is 138 copies/mL. A negative result does not preclude SARS-Cov-2 infection and should not be used as the sole basis for treatment or other patient management decisions. A negative result may occur with  improper specimen collection/handling, submission of specimen other than nasopharyngeal swab, presence of viral mutation(s) within the areas targeted by this assay, and inadequate number of viral copies(<138 copies/mL). A negative result must be combined with clinical observations, patient history, and epidemiological information. The expected result is  Negative.  Fact Sheet for Patients:  BloggerCourse.com  Fact Sheet for Healthcare Providers:  SeriousBroker.it  This test is no t yet approved or cleared by the [place]  FDA and  has been authorized for detection and/or diagnosis of SARS-CoV-2 by FDA under an Emergency Use Authorization (EUA). This EUA will remain  in effect (meaning this test can be used) for the duration of the COVID-19 declaration under Section 564(b)(1) of the Act, 21 U.S.C.section 360bbb-3(b)(1), unless the authorization is terminated  or revoked sooner.       Influenza A by PCR NEGATIVE NEGATIVE Final   Influenza B by PCR NEGATIVE NEGATIVE Final    Comment: (NOTE) The Xpert Xpress SARS-CoV-2/FLU/RSV plus assay is intended as an aid in the diagnosis of influenza from Nasopharyngeal swab specimens and should not be used as a sole basis for treatment. Nasal washings and aspirates are unacceptable for Xpert Xpress SARS-CoV-2/FLU/RSV testing.  Fact Sheet for Patients: BloggerCourse.com  Fact Sheet for Healthcare Providers: SeriousBroker.it  This test is not yet approved or cleared by the [place]  FDA and has been authorized for detection and/or diagnosis of SARS-CoV-2 by FDA under an Emergency Use Authorization (EUA). This EUA will remain in effect (meaning this test can be used) for the duration of the COVID-19 declaration under Section 564(b)(1) of the Act, 21 U.S.C. section 360bbb-3(b)(1), unless the authorization is terminated or revoked.     Resp Syncytial Virus by PCR NEGATIVE NEGATIVE Final    Comment: (NOTE) Fact Sheet for Patients: BloggerCourse.com  Fact Sheet for Healthcare Providers: SeriousBroker.it  This test is not yet approved or cleared by the [place]  FDA and has been authorized for detection and/or diagnosis of  SARS-CoV-2 by FDA under an Emergency Use Authorization (EUA). This EUA will remain in effect (meaning this test can be used) for the duration of the COVID-19 declaration under Section 564(b)(1) of the Act, 21 U.S.C. section 360bbb-3(b)(1), unless the authorization is terminated or revoked.  Performed at [facility], [address] [place], [place] [address]      Labs: BNP (last 3 results) No results for input(s): BNP in the last 8760 hours. Basic Metabolic Panel: Recent Labs  Lab 04/30/24 1722 04/30/24 2354 05/01/24 0509  NA 137  --  138  K 4.0  --  4.0  CL 104  --  105  CO2 22  --  22  GLUCOSE 140*  --  129*  BUN 26*  --  21  CREATININE 1.78*  --  1.23  CALCIUM  8.6*  --  8.2*  MG  --  1.7 1.7  PHOS  --  2.9 3.1   Liver Function Tests: Recent Labs  Lab 04/30/24 1722 05/01/24 0509  AST 18 17  ALT 14 13  ALKPHOS 48 40  BILITOT 0.6 0.6  PROT 7.7 6.5  ALBUMIN  3.2* 2.6*   No results for input(s): LIPASE, AMYLASE in the last 168 hours. No results for input(s): AMMONIA in the last 168 hours. CBC: Recent Labs  Lab 04/30/24 1722 05/01/24 0509  WBC 8.1 8.0  HGB 11.4* 10.9*  HCT 34.7* 32.0*  MCV 93.8 91.2  PLT 91* 85*   Cardiac Enzymes: Recent Labs  Lab 04/30/24 2354  CKTOTAL 96   BNP: Invalid input(s): POCBNP CBG: Recent Labs  Lab 05/02/24 1610 05/02/24 2000 05/02/24 2348 05/03/24 0409 05/03/24 0736  GLUCAP 153* 135* 193* 122* 100*   D-Dimer No results for input(s): DDIMER in the last 72 hours. Hgb A1c Recent Labs    05/01/24 0509  HGBA1C 7.5*   Lipid Profile No results for input(s): CHOL, HDL, LDLCALC, TRIG, CHOLHDL, LDLDIRECT in the last 72 hours. Thyroid  function studies Recent Labs    04/30/24 2033  TSH 0.373   Anemia work up No results for input(s): VITAMINB12, FOLATE, FERRITIN, TIBC, IRON, RETICCTPCT in the last 72 hours. Urinalysis    Component Value Date/Time    COLORURINE YELLOW 04/30/2024 2033   APPEARANCEUR CLEAR 04/30/2024 2033   LABSPEC 1.018 04/30/2024 2033   PHURINE 5.0 04/30/2024 2033   GLUCOSEU NEGATIVE 04/30/2024 2033   HGBUR SMALL (A) 04/30/2024 2033   BILIRUBINUR NEGATIVE 04/30/2024 2033   KETONESUR NEGATIVE 04/30/2024 2033   PROTEINUR 30 (A) 04/30/2024 2033   UROBILINOGEN 0.2 04/29/2015 2026   NITRITE NEGATIVE 04/30/2024 2033   LEUKOCYTESUR NEGATIVE 04/30/2024 2033   Sepsis Labs Recent Labs  Lab 04/30/24 1722 05/01/24 0509  WBC 8.1 8.0   Microbiology Recent Results (from the past 240 hours)  Resp panel by RT-PCR (RSV, Flu A&B, Covid) Anterior Nasal Swab     Status: None   Collection Time: 04/30/24 11:54 PM   Specimen: Anterior Nasal Swab  Result Value Ref Range Status   SARS Coronavirus 2 by RT PCR NEGATIVE NEGATIVE Final    Comment: (NOTE) SARS-CoV-2 target nucleic acids are NOT DETECTED.  The SARS-CoV-2 RNA is generally detectable in upper respiratory specimens during the acute phase of infection. The lowest concentration of SARS-CoV-2 viral copies this assay can detect is 138 copies/mL. A negative result does not preclude SARS-Cov-2 infection and should not be used as the sole basis for treatment or other patient management decisions. A negative result may occur with  improper specimen collection/handling, submission of specimen other than nasopharyngeal swab, presence of viral mutation(s) within the areas targeted by this assay, and inadequate number of viral copies(<138 copies/mL). A negative result must be combined with clinical observations, patient history, and epidemiological information. The expected result is Negative.  Fact Sheet for Patients:  BloggerCourse.com  Fact Sheet for Healthcare Providers:  SeriousBroker.it  This test is no t yet approved or cleared by the [place]  FDA and  has been authorized for detection and/or diagnosis of SARS-CoV-2  by FDA under an Emergency Use Authorization (EUA). This EUA will remain  in effect (meaning this test can be used) for the duration of the COVID-19 declaration under Section 564(b)(1) of the Act, 21 U.S.C.section 360bbb-3(b)(1), unless the authorization is terminated  or revoked sooner.       Influenza A by PCR NEGATIVE NEGATIVE Final  Influenza B by PCR NEGATIVE NEGATIVE Final    Comment: (NOTE) The Xpert Xpress SARS-CoV-2/FLU/RSV plus assay is intended as an aid in the diagnosis of influenza from Nasopharyngeal swab specimens and should not be used as a sole basis for treatment. Nasal washings and aspirates are unacceptable for Xpert Xpress SARS-CoV-2/FLU/RSV testing.  Fact Sheet for Patients: BloggerCourse.com  Fact Sheet for Healthcare Providers: SeriousBroker.it  This test is not yet approved or cleared by the [place]  FDA and has been authorized for detection and/or diagnosis of SARS-CoV-2 by FDA under an Emergency Use Authorization (EUA). This EUA will remain in effect (meaning this test can be used) for the duration of the COVID-19 declaration under Section 564(b)(1) of the Act, 21 U.S.C. section 360bbb-3(b)(1), unless the authorization is terminated or revoked.     Resp Syncytial Virus by PCR NEGATIVE NEGATIVE Final    Comment: (NOTE) Fact Sheet for Patients: BloggerCourse.com  Fact Sheet for Healthcare Providers: SeriousBroker.it  This test is not yet approved or cleared by the [place]  FDA and has been authorized for detection and/or diagnosis of SARS-CoV-2 by FDA under an Emergency Use Authorization (EUA). This EUA will remain in effect (meaning this test can be used) for the duration of the COVID-19 declaration under Section 564(b)(1) of the Act, 21 U.S.C. section 360bbb-3(b)(1), unless the authorization is terminated or revoked.  Performed at  [facility], [address], [place], [place] [address]     Please note: You were cared for by a hospitalist during your hospital stay. Once you are discharged, your primary care physician will handle any further medical issues. Please note that NO REFILLS for any discharge medications will be authorized once you are discharged, as it is imperative that you return to your primary care physician (or establish a relationship with a primary care physician if you do not have one) for your post hospital discharge needs so that they can reassess your need for medications and monitor your lab values.    Time coordinating discharge: 40 minutes  SIGNED:   Ivonne Mustache, MD  [facility] 05/03/2024, 10:10 AM Pager [phone]  If 7PM-7AM, please contact night-coverage [email or web address] Password [email or web address]

## 2024-05-03 NOTE — Progress Notes (Signed)
 AVS reviewed w/ patient who verbalized an understanding. Patient also verbalized that he has a BP cuff at home & will recheck daily & as needed. PIV removed as noted. Pt dressing for d/c to home. Pt drove self to hospital- will d/c via w/c to lobby and have courtesy shuttle drive patient to car. Discharge meds in a secure bag from the [facility] delivered to patient in room

## 2024-05-03 NOTE — Progress Notes (Signed)
 Discharge medication delivered to patient at bedside [facility]

## 2024-05-29 ENCOUNTER — Encounter: Payer: Self-pay | Admitting: Podiatry

## 2024-05-29 ENCOUNTER — Other Ambulatory Visit (HOSPITAL_COMMUNITY): Payer: Self-pay

## 2024-05-29 ENCOUNTER — Ambulatory Visit (INDEPENDENT_AMBULATORY_CARE_PROVIDER_SITE_OTHER): Admitting: Podiatry

## 2024-05-29 VITALS — Ht 74.0 in | Wt 188.0 lb

## 2024-05-29 DIAGNOSIS — B351 Tinea unguium: Secondary | ICD-10-CM | POA: Diagnosis not present

## 2024-05-29 DIAGNOSIS — M7752 Other enthesopathy of left foot: Secondary | ICD-10-CM

## 2024-05-29 DIAGNOSIS — M79672 Pain in left foot: Secondary | ICD-10-CM

## 2024-05-29 DIAGNOSIS — M7751 Other enthesopathy of right foot: Secondary | ICD-10-CM | POA: Diagnosis not present

## 2024-05-29 DIAGNOSIS — M79671 Pain in right foot: Secondary | ICD-10-CM

## 2024-05-29 NOTE — Progress Notes (Signed)
 Patient presents for evaluation and treatment of tenderness and some redness around nails feet.  Tenderness around toes with walking and wearing shoes.  Some tenderness of the plantar fifth MTP bilaterally.  No history of ulcerations or infections in the feet.  Physical exam:  General appearance: Alert, pleasant, and in no acute distress.  Vascular: Pedal pulses: DP 1/4 B/L, PT 0/4 B/L.Moderate  edema lower legs bilaterally.  Capillary refill time immediate bilaterally  Neuologic: Achilles tendon reflex normal.  Decreased vibratory sensation bilaterally monofilament sensation intact toes 1 through 5 bilaterally.  Dermatologic:  Nails thickened, disfigured, discolored 1-5 BL with subungual debris.  Redness and hypertrophic nail folds along nail folds bilaterally but no signs of drainage or infection.  Musculoskeletal:  Hammertoes 2 through 5 bilaterally.  Normal muscle strength lower extremity bilaterally.  Tailor's bunion deformities bilaterally.  Some tenderness at the plantar aspect of the fifth MTP bilateral.   Diagnosis: 1. Painful onychomycotic nails 1 through 5 bilaterally. 2. Pain toes 1 through 5 bilaterally. 3. Bursitis foot b/l  Plan: -New patient office visit for evaluation and management level 3.  Modifier 25. - Discussed with him the nails would recommend periodic debridement at this time.  If he has more problems with some of the nails he may eventually require surgical correction with a matrixectomy.  If we would pursue that route we would probably have to have vascular studies done before proceeding.  He also has some tailor's bunions which cause him some discomfort.  Recommend shoes that have good support and cushioning to them. -Debrided onychomycotic nails 1 through 5 bilaterally.  Sharply debrided nails with nail clipper and reduced with a power bur.  Return 3 months

## 2024-05-31 LAB — COLOGUARD: COLOGUARD: NEGATIVE

## 2024-06-05 ENCOUNTER — Other Ambulatory Visit

## 2024-06-13 ENCOUNTER — Other Ambulatory Visit

## 2024-06-15 ENCOUNTER — Encounter (HOSPITAL_BASED_OUTPATIENT_CLINIC_OR_DEPARTMENT_OTHER): Payer: Self-pay

## 2024-06-18 NOTE — Progress Notes (Deleted)
  Electrophysiology Office Note:   Date:  06/18/2024  ID:  Justin Clayton, DOB 03/02/1951, MRN [id number]  Primary Cardiologist: None Electrophysiologist: Fonda Kitty, MD  {Click to update primary MD,subspecialty MD or APP then REFRESH:1}    History of Present Illness:   Justin Clayton is a 73 y.o. male with h/o thrombocytopenia, type 2 diabetes, compression fracture, GERD, hypertension, hyperlipidemia, syncope who is being seen today for abnormal Zio monitor.   Discussed the use of AI scribe software for clinical note transcription with the patient, who gave verbal consent to proceed.  History of Present Illness     Review of systems complete and found to be negative unless listed in HPI.   EP Information / Studies Reviewed:    EKG is not ordered today. EKG from 05/01/24 reviewed which showed sinus rhythm, PR and QRS 80ms.      Zio monitor 03/2024:    Echo 05/01/24:  1. Left ventricular ejection fraction, by estimation, is 60 to 65%. The  left ventricle has normal function. The left ventricle has no regional  wall motion abnormalities. There is mild left ventricular hypertrophy.  Left ventricular diastolic parameters  are consistent with Grade I diastolic dysfunction (impaired relaxation).   2. Right ventricular systolic function is normal. The right ventricular  size is normal.   3. The mitral valve is normal in structure. No evidence of mitral valve  regurgitation. No evidence of mitral stenosis.   4. The aortic valve is tricuspid. There is moderate calcification of the  aortic valve. There is moderate thickening of the aortic valve. Aortic  valve regurgitation is mild. Aortic valve sclerosis/calcification is  present, without any evidence of aortic  stenosis.   5. The inferior vena cava is normal in size with greater than 50%  respiratory variability, suggesting right atrial pressure of 3 mmHg.    Physical Exam:   VS:  There were no vitals taken for  this visit.   Wt Readings from Last 3 Encounters:  05/29/24 188 lb (85.3 kg)  05/01/24 188 lb 1.6 oz (85.3 kg)  03/18/23 211 lb (95.7 kg)     GEN: Well nourished, well developed in no acute distress NECK: No JVD CARDIAC: {EPRHYTHM:28826}, no murmurs, rubs, gallops RESPIRATORY:  Clear to auscultation without rales, wheezing or rhonchi  ABDOMEN: Soft, non-distended EXTREMITIES:  No edema; No deformity   ASSESSMENT AND PLAN:    #NSVT #PSVT  #Syncope Assessment & Plan       Follow up with {EPMDS:28135::EP Team} {EPFOLLOW LE:71826}  Signed, Fonda Kitty, MD

## 2024-06-19 ENCOUNTER — Ambulatory Visit: Admitting: Cardiology

## 2024-06-27 ENCOUNTER — Ambulatory Visit: Attending: Internal Medicine | Admitting: Student in an Organized Health Care Education/Training Program

## 2024-06-27 ENCOUNTER — Encounter: Payer: Self-pay | Admitting: Student in an Organized Health Care Education/Training Program

## 2024-06-27 VITALS — BP 104/60 | HR 98 | Ht 74.0 in | Wt 197.0 lb

## 2024-06-27 DIAGNOSIS — R55 Syncope and collapse: Secondary | ICD-10-CM

## 2024-06-27 NOTE — Progress Notes (Unsigned)
 Cardiology Office Note   Date: 06/27/24 ID:  Justin Clayton, DOB 1951/08/02, MRN [id number] PCP: Corean Grippe, MD  [facility] Providers Cardiologist:  None Electrophysiologist:  Donnice DELENA Primus, MD   History of Present Illness Justin Clayton is a 73 y.o. male with DM2, HTN, and HLD who presents for evaluation of abnormal ZIO monitor and syncope.   He has not been experiencing any palpitations but did have an episode of syncope around 1 month ago.  Presyncopal episodes were happening 3-4 times a month and were often associated with going to the restroom or associated hypoglycemia.  The last episode he had he was about to go to the restroom and had sudden onset syncope where he broke his glasses.  He said he felt warm when he woke up during this episode.  The prior event was several months before when he was going to the fridge to get some food as he felt that his blood sugar was low and had another episode where he fell out on the floor.  He checked his blood sugar after the episode and it was low.  He did not experience any episodes while wearing the monitor.  Mr. Justin Clayton has lived locally for over 27 years.  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  He lives with his significant other for the past 25 years although he says they are not married.  ROS: syncope, presyncope  Studies Reviewed  ECG review 05/01/24: NSR 88, PR 150, QRS 80, QT/c 352/425  Zio monitor Result date: 04/04/24-04/18/24 HR 60-143, avg 91 Patient had a min heart rate 68, max heart rate 179, average heart rate 91.  Predominant underlying rhythm was sinus.  1 run of NSVT 6 beats with a max rate of 176.  5 episodes of SVT occurred with the fastest interval lasting 20 beats at a max of 179.  Rare PACs/PVCs both of less than 1%.  Physical Exam VS:  BP 104/60 (BP Location: Left Arm, Patient Position:  Sitting, Cuff Size: Large)   Pulse 98   Ht 6' 2 (1.88 m)   Wt 197 lb (89.4 kg)   SpO2 99%   BMI 25.29 kg/m        Wt Readings from Last 3 Encounters:  06/27/24 197 lb (89.4 kg)  05/29/24 188 lb (85.3 kg)  05/01/24 188 lb 1.6 oz (85.3 kg)    GEN: Well nourished, well developed in no acute distress CARDIAC: RRR, no murmurs, rubs, gallops RESPIRATORY:  Clear to auscultation without rales, wheezing or rhonchi  EXTREMITIES:  No edema; No deformity   ASSESSMENT AND PLAN [name] is a 73 y.o. male with DM2, HTN, and HLD who presents for evaluation of abnormal ZIO monitor and syncope.   Syncope  He has had several episodes over the past few months.  Some of the episodes seem consistent with vasovagal syncope while other episodes may be related to hypoglycemia.  He does not have any concerning events on his Holter monitor although he was asymptomatic during that time.  If he continues to have events that are not consistent with either vasovagal syncope or hypoglycemia then we could consider ILR implant for further monitoring.  He has no evidence of high degree AV block or infrahisian conduction disease on baseline ECG so I think the likelihood of him having bradycardic syncope is low.  He is having  these episodes several times a month so if he continues to have these we should repeat monitoring to see if we can catch one of the events.  For now no changes.  He is on no medications that should precipitate these events from a bradycardia standpoint.  Will defer DM2 management to his PCP.  Could consider DC glipizide and/or reducing his insulin  dosing if he continues to have episodes of symptomatic hypoglycemia. Most recent A1c 7.5.    Dispo: RTC prn   A total of 30 minutes was spent preparing for the patient, reviewing history, performing exam, document encounter, coordinating care and counseling the patient. 15 minutes was spent with direct patient care.    Signed, Donnice DELENA Primus, MD

## 2024-06-27 NOTE — Patient Instructions (Signed)
 Medication Instructions:  Your physician recommends that you continue on your current medications as directed. Please refer to the Current Medication list given to you today.  *If you need a refill on your cardiac medications before your next appointment, please call your pharmacy*  Lab Work: None ordered.  If you have labs (blood work) drawn today and your tests are completely normal, you will receive your results only by: MyChart Message (if you have MyChart) OR A paper copy in the mail If you have any lab test that is abnormal or we need to change your treatment, we will call you to review the results.  Testing/Procedures: None ordered.   Follow-Up: At [facility], you and your health needs are our priority.  As part of our continuing mission to provide you with exceptional heart care, our providers are all part of one team.  This team includes your primary Cardiologist (physician) and Advanced Practice Providers or APPs (Physician Assistants and Nurse Practitioners) who all work together to provide you with the care you need, when you need it.  Your next appointment:   As needed with Dr Marcey

## 2024-07-10 ENCOUNTER — Ambulatory Visit
Admission: RE | Admit: 2024-07-10 | Discharge: 2024-07-10 | Disposition: A | Discharge status: Home / Self Care | Source: Ambulatory Visit | Attending: Family Medicine | Admitting: Family Medicine

## 2024-07-10 DIAGNOSIS — K7400 Hepatic fibrosis, unspecified: Secondary | ICD-10-CM

## 2024-08-28 ENCOUNTER — Ambulatory Visit: Admitting: Podiatry

## 2024-08-28 DIAGNOSIS — M79672 Pain in left foot: Secondary | ICD-10-CM

## 2024-08-28 DIAGNOSIS — B351 Tinea unguium: Secondary | ICD-10-CM | POA: Diagnosis not present

## 2024-08-28 DIAGNOSIS — M79671 Pain in right foot: Secondary | ICD-10-CM | POA: Diagnosis not present

## 2024-08-28 NOTE — Progress Notes (Signed)
 Patient presents for evaluation and treatment of tenderness and some redness around nails feet.  Tenderness around toes with walking and wearing shoes.  Physical exam:  General appearance: Alert, pleasant, and in no acute distress.  Vascular: Pedal pulses: DP 1/4 B/L, PT 0/4 B/L. Moderate edema lower legs bilaterally.  Capillary refill time immediate bilaterally  Neurologic:  Dermatologic:  Nails thickened, disfigured, discolored 1-5 BL with subungual debris.  Redness and hypertrophic nail folds along nail folds bilaterally but no signs of drainage or infection.  Musculoskeletal:     Diagnosis: 1. Painful onychomycotic nails 1 through 5 bilaterally. 2. Pain toes 1 through 5 bilaterally.  Plan: -Debrided onychomycotic nails 1 through 5 bilaterally.  Sharply debrided nails with nail clipper and reduced with a power bur.  Return 3 months [facility]

## 2024-11-27 ENCOUNTER — Ambulatory Visit: Admitting: Podiatry
